# Patient Record
Sex: Male | Born: 1935 | Race: Black or African American | Hispanic: No | Smoking: Former smoker
Health system: Southern US, Community
[De-identification: ages and names within clinical notes are randomized; demographics above are authoritative.]

## PROBLEM LIST (undated history)

## (undated) DIAGNOSIS — I509 Heart failure, unspecified: Secondary | ICD-10-CM

## (undated) DIAGNOSIS — K219 Gastro-esophageal reflux disease without esophagitis: Secondary | ICD-10-CM

## (undated) DIAGNOSIS — I10 Essential (primary) hypertension: Secondary | ICD-10-CM

## (undated) DIAGNOSIS — M1990 Unspecified osteoarthritis, unspecified site: Secondary | ICD-10-CM

## (undated) DIAGNOSIS — G629 Polyneuropathy, unspecified: Secondary | ICD-10-CM

## (undated) DIAGNOSIS — R0600 Dyspnea, unspecified: Secondary | ICD-10-CM

## (undated) DIAGNOSIS — E669 Obesity, unspecified: Secondary | ICD-10-CM

## (undated) DIAGNOSIS — R079 Chest pain, unspecified: Secondary | ICD-10-CM

## (undated) DIAGNOSIS — J449 Chronic obstructive pulmonary disease, unspecified: Secondary | ICD-10-CM

## (undated) DIAGNOSIS — N471 Phimosis: Secondary | ICD-10-CM

## (undated) DIAGNOSIS — E785 Hyperlipidemia, unspecified: Secondary | ICD-10-CM

## (undated) HISTORY — PX: CHOLECYSTECTOMY: SHX55

## (undated) HISTORY — DX: Hyperlipidemia, unspecified: E78.5

## (undated) HISTORY — PX: ABDOMINAL SURGERY: SHX537

## (undated) HISTORY — PX: BACK SURGERY: SHX140

---

## 1998-08-04 ENCOUNTER — Encounter: Payer: Self-pay | Admitting: Emergency Medicine

## 1998-08-04 ENCOUNTER — Emergency Department (HOSPITAL_COMMUNITY): Admission: EM | Admit: 1998-08-04 | Discharge: 1998-08-04 | Payer: Self-pay | Admitting: Emergency Medicine

## 1999-12-22 ENCOUNTER — Emergency Department (HOSPITAL_COMMUNITY): Admission: EM | Admit: 1999-12-22 | Discharge: 1999-12-22 | Payer: Self-pay | Admitting: Emergency Medicine

## 1999-12-22 ENCOUNTER — Encounter: Payer: Self-pay | Admitting: Emergency Medicine

## 2000-01-07 ENCOUNTER — Emergency Department (HOSPITAL_COMMUNITY): Admission: EM | Admit: 2000-01-07 | Discharge: 2000-01-07 | Payer: Self-pay | Admitting: Emergency Medicine

## 2000-07-19 ENCOUNTER — Emergency Department (HOSPITAL_COMMUNITY): Admission: EM | Admit: 2000-07-19 | Discharge: 2000-07-19 | Payer: Self-pay | Admitting: Emergency Medicine

## 2000-07-19 ENCOUNTER — Encounter: Payer: Self-pay | Admitting: Emergency Medicine

## 2000-07-30 ENCOUNTER — Encounter: Admission: RE | Admit: 2000-07-30 | Discharge: 2000-07-30 | Payer: Self-pay | Admitting: Internal Medicine

## 2000-08-17 ENCOUNTER — Ambulatory Visit (HOSPITAL_COMMUNITY): Admission: RE | Admit: 2000-08-17 | Discharge: 2000-08-17 | Payer: Self-pay

## 2001-05-31 ENCOUNTER — Emergency Department (HOSPITAL_COMMUNITY): Admission: EM | Admit: 2001-05-31 | Discharge: 2001-05-31 | Payer: Self-pay

## 2003-02-10 ENCOUNTER — Encounter: Payer: Self-pay | Admitting: Nephrology

## 2003-02-10 ENCOUNTER — Encounter: Admission: RE | Admit: 2003-02-10 | Discharge: 2003-02-10 | Payer: Self-pay | Admitting: Nephrology

## 2003-05-18 ENCOUNTER — Encounter: Admission: RE | Admit: 2003-05-18 | Discharge: 2003-05-18 | Payer: Self-pay | Admitting: Nephrology

## 2004-08-29 ENCOUNTER — Encounter: Admission: RE | Admit: 2004-08-29 | Discharge: 2004-08-29 | Payer: Self-pay | Admitting: Nephrology

## 2005-03-21 ENCOUNTER — Encounter: Admission: RE | Admit: 2005-03-21 | Discharge: 2005-03-21 | Payer: Self-pay | Admitting: Nephrology

## 2005-11-23 ENCOUNTER — Ambulatory Visit (HOSPITAL_COMMUNITY): Admission: RE | Admit: 2005-11-23 | Discharge: 2005-11-23 | Payer: Self-pay | Admitting: Nephrology

## 2006-04-14 ENCOUNTER — Emergency Department (HOSPITAL_COMMUNITY): Admission: EM | Admit: 2006-04-14 | Discharge: 2006-04-14 | Payer: Self-pay | Admitting: Emergency Medicine

## 2006-06-30 ENCOUNTER — Emergency Department (HOSPITAL_COMMUNITY): Admission: EM | Admit: 2006-06-30 | Discharge: 2006-06-30 | Payer: Self-pay | Admitting: Emergency Medicine

## 2006-09-20 ENCOUNTER — Encounter: Admission: RE | Admit: 2006-09-20 | Discharge: 2006-09-20 | Payer: Self-pay | Admitting: Nephrology

## 2006-10-09 ENCOUNTER — Inpatient Hospital Stay (HOSPITAL_BASED_OUTPATIENT_CLINIC_OR_DEPARTMENT_OTHER): Admission: RE | Admit: 2006-10-09 | Discharge: 2006-10-09 | Payer: Self-pay | Admitting: Cardiology

## 2006-11-28 ENCOUNTER — Encounter: Admission: RE | Admit: 2006-11-28 | Discharge: 2006-11-28 | Payer: Self-pay | Admitting: Otolaryngology

## 2006-11-29 ENCOUNTER — Emergency Department (HOSPITAL_COMMUNITY): Admission: EM | Admit: 2006-11-29 | Discharge: 2006-11-29 | Payer: Self-pay | Admitting: Emergency Medicine

## 2007-08-21 ENCOUNTER — Encounter: Admission: RE | Admit: 2007-08-21 | Discharge: 2007-08-21 | Payer: Self-pay | Admitting: Nephrology

## 2007-08-28 ENCOUNTER — Ambulatory Visit (HOSPITAL_COMMUNITY): Admission: RE | Admit: 2007-08-28 | Discharge: 2007-08-28 | Payer: Self-pay | Admitting: Nephrology

## 2007-08-28 ENCOUNTER — Encounter (INDEPENDENT_AMBULATORY_CARE_PROVIDER_SITE_OTHER): Payer: Self-pay | Admitting: Nephrology

## 2007-08-28 ENCOUNTER — Ambulatory Visit: Payer: Self-pay | Admitting: Vascular Surgery

## 2008-04-29 ENCOUNTER — Emergency Department (HOSPITAL_COMMUNITY): Admission: EM | Admit: 2008-04-29 | Discharge: 2008-04-29 | Payer: Self-pay | Admitting: Emergency Medicine

## 2008-04-30 ENCOUNTER — Inpatient Hospital Stay (HOSPITAL_COMMUNITY): Admission: EM | Admit: 2008-04-30 | Discharge: 2008-05-05 | Payer: Self-pay | Admitting: Emergency Medicine

## 2008-04-30 ENCOUNTER — Encounter (INDEPENDENT_AMBULATORY_CARE_PROVIDER_SITE_OTHER): Payer: Self-pay | Admitting: General Surgery

## 2009-11-07 ENCOUNTER — Emergency Department (HOSPITAL_COMMUNITY): Admission: EM | Admit: 2009-11-07 | Discharge: 2009-11-07 | Payer: Self-pay | Admitting: Emergency Medicine

## 2009-11-13 ENCOUNTER — Emergency Department (HOSPITAL_COMMUNITY): Admission: EM | Admit: 2009-11-13 | Discharge: 2009-11-13 | Payer: Self-pay | Admitting: Emergency Medicine

## 2010-01-06 ENCOUNTER — Emergency Department (HOSPITAL_COMMUNITY): Admission: EM | Admit: 2010-01-06 | Discharge: 2010-01-06 | Payer: Self-pay | Admitting: Emergency Medicine

## 2010-01-20 ENCOUNTER — Emergency Department (HOSPITAL_COMMUNITY): Admission: EM | Admit: 2010-01-20 | Discharge: 2010-01-21 | Payer: Self-pay | Admitting: Emergency Medicine

## 2010-01-30 ENCOUNTER — Inpatient Hospital Stay (HOSPITAL_COMMUNITY): Admission: EM | Admit: 2010-01-30 | Discharge: 2010-02-04 | Payer: Self-pay | Admitting: Emergency Medicine

## 2010-02-01 ENCOUNTER — Encounter: Payer: Self-pay | Admitting: Cardiology

## 2010-02-02 ENCOUNTER — Other Ambulatory Visit: Payer: Self-pay | Admitting: Nephrology

## 2010-02-03 ENCOUNTER — Other Ambulatory Visit: Payer: Self-pay | Admitting: Nephrology

## 2010-02-04 ENCOUNTER — Other Ambulatory Visit: Payer: Self-pay | Admitting: Nephrology

## 2010-09-16 LAB — DIFFERENTIAL
Basophils Absolute: 0 10*3/uL (ref 0.0–0.1)
Basophils Relative: 0 % (ref 0–1)
Eosinophils Absolute: 0.4 10*3/uL (ref 0.0–0.7)
Eosinophils Relative: 4 % (ref 0–5)
Lymphocytes Relative: 36 % (ref 12–46)
Lymphs Abs: 2.2 10*3/uL (ref 0.7–4.0)
Monocytes Absolute: 0.6 10*3/uL (ref 0.1–1.0)
Monocytes Absolute: 0.6 10*3/uL (ref 0.1–1.0)
Monocytes Relative: 8 % (ref 3–12)
Monocytes Relative: 8 % (ref 3–12)
Neutro Abs: 3.9 10*3/uL (ref 1.7–7.7)

## 2010-09-16 LAB — BASIC METABOLIC PANEL
Chloride: 104 mEq/L (ref 96–112)
Creatinine, Ser: 1.24 mg/dL (ref 0.4–1.5)
GFR calc Af Amer: >60 mL/min (ref >60)
Potassium: 4.1 mEq/L (ref 3.5–5.1)

## 2010-09-16 LAB — GLUCOSE, CAPILLARY
Glucose-Capillary: 162 mg/dL — ABNORMAL HIGH (ref 70–99)
Glucose-Capillary: 167 mg/dL — ABNORMAL HIGH (ref 70–99)
Glucose-Capillary: 146 mg/dL — ABNORMAL HIGH (ref 70–99)
Glucose-Capillary: 168 mg/dL — ABNORMAL HIGH (ref 70–99)
Glucose-Capillary: 170 mg/dL — ABNORMAL HIGH (ref 70–99)
Glucose-Capillary: 177 mg/dL — ABNORMAL HIGH (ref 70–99)
Glucose-Capillary: 189 mg/dL — ABNORMAL HIGH (ref 70–99)

## 2010-09-16 LAB — RENAL FUNCTION PANEL
CO2: 24 mEq/L (ref 19–32)
Calcium: 8.5 mg/dL (ref 8.4–10.5)
Chloride: 103 mEq/L (ref 96–112)
GFR calc non Af Amer: 58 mL/min — ABNORMAL LOW (ref >60)
Glucose, Bld: 153 mg/dL — ABNORMAL HIGH (ref 70–99)
Sodium: 135 mEq/L (ref 135–145)

## 2010-09-16 LAB — LIPID PANEL
Cholesterol: 178 mg/dL (ref 0–200)
Total CHOL/HDL Ratio: 5.2 RATIO
VLDL: 39 mg/dL (ref 0–40)

## 2010-09-16 LAB — COMPREHENSIVE METABOLIC PANEL
AST: 17 U/L (ref 0–37)
Albumin: 2.6 g/dL — ABNORMAL LOW (ref 3.5–5.2)
Alkaline Phosphatase: 70 U/L (ref 39–117)
Chloride: 108 mEq/L (ref 96–112)
GFR calc Af Amer: >60 mL/min (ref >60)
Potassium: 3.8 mEq/L (ref 3.5–5.1)
Total Bilirubin: 0.4 mg/dL (ref 0.3–1.2)
Total Protein: 6.2 g/dL (ref 6.0–8.3)

## 2010-09-16 LAB — CBC
HCT: 42.6 % (ref 39.0–52.0)
HCT: 42.2 % (ref 39.0–52.0)
Hemoglobin: 14.6 g/dL (ref 13.0–17.0)
Hemoglobin: 14.2 g/dL (ref 13.0–17.0)
MCH: 31.5 pg (ref 26.0–34.0)
MCH: 31.8 pg (ref 26.0–34.0)
MCHC: 34.3 g/dL (ref 30.0–36.0)
MCV: 93.8 fL (ref 78.0–100.0)
MCV: 94.2 fL (ref 78.0–100.0)
Platelets: 136 10*3/uL — ABNORMAL LOW (ref 150–400)
Platelets: 138 10*3/uL — ABNORMAL LOW (ref 150–400)
RBC: 4.64 MIL/uL (ref 4.22–5.81)
RBC: 4.35 MIL/uL (ref 4.22–5.81)
RBC: 4.48 MIL/uL (ref 4.22–5.81)
RDW: 15.2 % (ref 11.5–15.5)
RDW: 15.5 % (ref 11.5–15.5)
WBC: 7.3 10*3/uL (ref 4.0–10.5)
WBC: 7.4 10*3/uL (ref 4.0–10.5)

## 2010-09-16 LAB — HEPARIN LEVEL (UNFRACTIONATED)
Heparin Unfractionated: 0.42 IU/mL (ref 0.30–0.70)
Heparin Unfractionated: 0.65 IU/mL (ref 0.30–0.70)

## 2010-09-16 LAB — PROTIME-INR: INR: 1.12 (ref 0.00–1.49)

## 2010-09-16 LAB — CARDIAC PANEL(CRET KIN+CKTOT+MB+TROPI)
Total CK: 103 U/L (ref 7–232)
Troponin I: <0.01 ng/mL (ref 0.00–0.06)

## 2010-09-16 LAB — MRSA PCR SCREENING: MRSA by PCR: NEGATIVE

## 2010-09-17 LAB — POCT CARDIAC MARKERS
CKMB, poc: 1.3 ng/mL (ref 1.0–8.0)
Myoglobin, poc: 135 ng/mL (ref 12–200)
Troponin i, poc: <0.05 ng/mL (ref 0.00–0.09)

## 2010-09-17 LAB — CARDIAC PANEL(CRET KIN+CKTOT+MB+TROPI)
CK, MB: 1 ng/mL (ref 0.3–4.0)
Troponin I: 0.02 ng/mL (ref 0.00–0.06)

## 2010-09-17 LAB — DIFFERENTIAL
Basophils Absolute: 0.1 10*3/uL (ref 0.0–0.1)
Lymphocytes Relative: 37 % (ref 12–46)
Lymphocytes Relative: 39 % (ref 12–46)
Lymphs Abs: 2.4 10*3/uL (ref 0.7–4.0)
Lymphs Abs: 2.6 10*3/uL (ref 0.7–4.0)
Monocytes Absolute: 0.5 10*3/uL (ref 0.1–1.0)
Monocytes Relative: 5 % (ref 3–12)
Neutro Abs: 3.1 10*3/uL (ref 1.7–7.7)
Neutro Abs: 3.5 10*3/uL (ref 1.7–7.7)
Neutrophils Relative %: 54 % (ref 43–77)

## 2010-09-17 LAB — URINALYSIS, ROUTINE W REFLEX MICROSCOPIC
Leukocytes, UA: NEGATIVE
Nitrite: NEGATIVE
Protein, ur: NEGATIVE mg/dL
Urobilinogen, UA: 0.2 mg/dL (ref 0.0–1.0)

## 2010-09-17 LAB — POCT I-STAT, CHEM 8
BUN: 11 mg/dL (ref 6–23)
Calcium, Ion: 1.11 mmol/L — ABNORMAL LOW (ref 1.12–1.32)
Chloride: 106 mEq/L (ref 96–112)
Creatinine, Ser: 1.2 mg/dL (ref 0.4–1.5)
Sodium: 140 mEq/L (ref 135–145)

## 2010-09-17 LAB — CK TOTAL AND CKMB (NOT AT ARMC): CK, MB: 1.1 ng/mL (ref 0.3–4.0)

## 2010-09-17 LAB — BASIC METABOLIC PANEL
Calcium: 9.1 mg/dL (ref 8.4–10.5)
GFR calc Af Amer: >60 mL/min (ref >60)
GFR calc non Af Amer: 56 mL/min — ABNORMAL LOW (ref >60)
Potassium: 3.6 mEq/L (ref 3.5–5.1)
Sodium: 138 mEq/L (ref 135–145)

## 2010-09-17 LAB — GLUCOSE, CAPILLARY
Glucose-Capillary: 163 mg/dL — ABNORMAL HIGH (ref 70–99)
Glucose-Capillary: 238 mg/dL — ABNORMAL HIGH (ref 70–99)

## 2010-09-17 LAB — CBC
HCT: 43.5 % (ref 39.0–52.0)
Hemoglobin: 14.8 g/dL (ref 13.0–17.0)
Hemoglobin: 15.6 g/dL (ref 13.0–17.0)
MCV: 93.8 fL (ref 78.0–100.0)
RBC: 4.64 MIL/uL (ref 4.22–5.81)
RBC: 4.86 MIL/uL (ref 4.22–5.81)
RDW: 15.5 % (ref 11.5–15.5)
WBC: 6.5 10*3/uL (ref 4.0–10.5)

## 2010-09-17 LAB — URINE CULTURE

## 2010-09-17 LAB — MRSA PCR SCREENING: MRSA by PCR: POSITIVE — AB

## 2010-09-17 LAB — URINE MICROSCOPIC-ADD ON

## 2010-09-18 LAB — POCT I-STAT, CHEM 8
BUN: 9 mg/dL (ref 6–23)
Calcium, Ion: 1.13 mmol/L (ref 1.12–1.32)
Chloride: 107 mEq/L (ref 96–112)
Glucose, Bld: 199 mg/dL — ABNORMAL HIGH (ref 70–99)

## 2010-09-20 LAB — URINALYSIS, ROUTINE W REFLEX MICROSCOPIC
Bilirubin Urine: NEGATIVE
Ketones, ur: NEGATIVE mg/dL
Nitrite: NEGATIVE
Protein, ur: NEGATIVE mg/dL
Urobilinogen, UA: 1 mg/dL (ref 0.0–1.0)

## 2010-09-20 LAB — GLUCOSE, CAPILLARY: Glucose-Capillary: 245 mg/dL — ABNORMAL HIGH (ref 70–99)

## 2010-09-20 LAB — URINE CULTURE: Colony Count: 4000

## 2010-09-20 LAB — CBC
HCT: 48.2 % (ref 39.0–52.0)
Platelets: 138 10*3/uL — ABNORMAL LOW (ref 150–400)
RBC: 5.15 MIL/uL (ref 4.22–5.81)
WBC: 7.5 10*3/uL (ref 4.0–10.5)

## 2010-09-20 LAB — POCT I-STAT, CHEM 8
BUN: 14 mg/dL (ref 6–23)
Chloride: 105 mEq/L (ref 96–112)
Creatinine, Ser: 1.1 mg/dL (ref 0.4–1.5)
Potassium: 4.4 mEq/L (ref 3.5–5.1)
Sodium: 139 mEq/L (ref 135–145)

## 2010-09-20 LAB — DIFFERENTIAL
Lymphocytes Relative: 34 % (ref 12–46)
Lymphs Abs: 2.6 10*3/uL (ref 0.7–4.0)
Neutrophils Relative %: 54 % (ref 43–77)

## 2010-09-20 LAB — CULTURE, ROUTINE-ABSCESS

## 2010-09-20 LAB — URINE MICROSCOPIC-ADD ON

## 2010-10-21 ENCOUNTER — Emergency Department (HOSPITAL_COMMUNITY)
Admission: EM | Admit: 2010-10-21 | Discharge: 2010-10-21 | Disposition: A | Discharge status: Home / Self Care | Payer: Medicare Other | Attending: Emergency Medicine | Admitting: Emergency Medicine

## 2010-10-21 ENCOUNTER — Emergency Department (HOSPITAL_COMMUNITY): Payer: Medicare Other

## 2010-10-21 DIAGNOSIS — J449 Chronic obstructive pulmonary disease, unspecified: Secondary | ICD-10-CM | POA: Insufficient documentation

## 2010-10-21 DIAGNOSIS — E785 Hyperlipidemia, unspecified: Secondary | ICD-10-CM | POA: Insufficient documentation

## 2010-10-21 DIAGNOSIS — X58XXXA Exposure to other specified factors, initial encounter: Secondary | ICD-10-CM | POA: Insufficient documentation

## 2010-10-21 DIAGNOSIS — R071 Chest pain on breathing: Secondary | ICD-10-CM | POA: Insufficient documentation

## 2010-10-21 DIAGNOSIS — I10 Essential (primary) hypertension: Secondary | ICD-10-CM | POA: Insufficient documentation

## 2010-10-21 DIAGNOSIS — Z79899 Other long term (current) drug therapy: Secondary | ICD-10-CM | POA: Insufficient documentation

## 2010-10-21 DIAGNOSIS — J4489 Other specified chronic obstructive pulmonary disease: Secondary | ICD-10-CM | POA: Insufficient documentation

## 2010-10-21 DIAGNOSIS — E119 Type 2 diabetes mellitus without complications: Secondary | ICD-10-CM | POA: Insufficient documentation

## 2010-10-21 DIAGNOSIS — T148XXA Other injury of unspecified body region, initial encounter: Secondary | ICD-10-CM | POA: Insufficient documentation

## 2010-10-23 ENCOUNTER — Emergency Department (HOSPITAL_COMMUNITY): Payer: Medicare Other

## 2010-10-23 ENCOUNTER — Emergency Department (HOSPITAL_COMMUNITY)
Admission: EM | Admit: 2010-10-23 | Discharge: 2010-10-23 | Disposition: A | Discharge status: Home / Self Care | Payer: Medicare Other | Attending: Emergency Medicine | Admitting: Emergency Medicine

## 2010-10-23 DIAGNOSIS — J449 Chronic obstructive pulmonary disease, unspecified: Secondary | ICD-10-CM | POA: Insufficient documentation

## 2010-10-23 DIAGNOSIS — Z79899 Other long term (current) drug therapy: Secondary | ICD-10-CM | POA: Insufficient documentation

## 2010-10-23 DIAGNOSIS — R071 Chest pain on breathing: Secondary | ICD-10-CM | POA: Insufficient documentation

## 2010-10-23 DIAGNOSIS — E119 Type 2 diabetes mellitus without complications: Secondary | ICD-10-CM | POA: Insufficient documentation

## 2010-10-23 DIAGNOSIS — E785 Hyperlipidemia, unspecified: Secondary | ICD-10-CM | POA: Insufficient documentation

## 2010-10-23 DIAGNOSIS — J4489 Other specified chronic obstructive pulmonary disease: Secondary | ICD-10-CM | POA: Insufficient documentation

## 2010-10-23 DIAGNOSIS — I10 Essential (primary) hypertension: Secondary | ICD-10-CM | POA: Insufficient documentation

## 2010-10-23 LAB — BASIC METABOLIC PANEL
BUN: 10 mg/dL (ref 6–23)
Chloride: 104 mEq/L (ref 96–112)
Creatinine, Ser: 1.14 mg/dL (ref 0.4–1.5)

## 2010-10-23 LAB — CBC
MCH: 31.3 pg (ref 26.0–34.0)
Platelets: 134 10*3/uL — ABNORMAL LOW (ref 150–400)
RBC: 5.43 MIL/uL (ref 4.22–5.81)
WBC: 6.6 10*3/uL (ref 4.0–10.5)

## 2010-10-23 LAB — POCT CARDIAC MARKERS: Troponin i, poc: <0.05 ng/mL (ref 0.00–0.09)

## 2010-10-23 LAB — DIFFERENTIAL
Basophils Relative: 1 % (ref 0–1)
Eosinophils Absolute: 0.4 10*3/uL (ref 0.0–0.7)
Monocytes Relative: 7 % (ref 3–12)
Neutrophils Relative %: 48 % (ref 43–77)

## 2010-10-25 ENCOUNTER — Other Ambulatory Visit (HOSPITAL_COMMUNITY): Payer: Self-pay | Admitting: Nephrology

## 2010-10-25 ENCOUNTER — Ambulatory Visit (HOSPITAL_COMMUNITY)
Admission: RE | Admit: 2010-10-25 | Discharge: 2010-10-25 | Disposition: A | Discharge status: Home / Self Care | Payer: Medicare Other | Source: Ambulatory Visit | Attending: Nephrology | Admitting: Nephrology

## 2010-10-25 DIAGNOSIS — M25519 Pain in unspecified shoulder: Secondary | ICD-10-CM | POA: Insufficient documentation

## 2010-10-25 DIAGNOSIS — M542 Cervicalgia: Secondary | ICD-10-CM | POA: Insufficient documentation

## 2010-10-25 DIAGNOSIS — R52 Pain, unspecified: Secondary | ICD-10-CM

## 2010-10-25 DIAGNOSIS — M539 Dorsopathy, unspecified: Secondary | ICD-10-CM | POA: Insufficient documentation

## 2010-10-25 DIAGNOSIS — R0789 Other chest pain: Secondary | ICD-10-CM | POA: Insufficient documentation

## 2010-10-29 ENCOUNTER — Emergency Department (HOSPITAL_COMMUNITY): Payer: Medicare Other

## 2010-10-29 ENCOUNTER — Emergency Department (HOSPITAL_COMMUNITY)
Admission: EM | Admit: 2010-10-29 | Discharge: 2010-10-29 | Disposition: A | Discharge status: Home / Self Care | Payer: Medicare Other | Attending: Emergency Medicine | Admitting: Emergency Medicine

## 2010-10-29 DIAGNOSIS — Z7982 Long term (current) use of aspirin: Secondary | ICD-10-CM | POA: Insufficient documentation

## 2010-10-29 DIAGNOSIS — M25519 Pain in unspecified shoulder: Secondary | ICD-10-CM | POA: Insufficient documentation

## 2010-10-29 DIAGNOSIS — J449 Chronic obstructive pulmonary disease, unspecified: Secondary | ICD-10-CM | POA: Insufficient documentation

## 2010-10-29 DIAGNOSIS — Z9889 Other specified postprocedural states: Secondary | ICD-10-CM | POA: Insufficient documentation

## 2010-10-29 DIAGNOSIS — E785 Hyperlipidemia, unspecified: Secondary | ICD-10-CM | POA: Insufficient documentation

## 2010-10-29 DIAGNOSIS — I10 Essential (primary) hypertension: Secondary | ICD-10-CM | POA: Insufficient documentation

## 2010-10-29 DIAGNOSIS — R079 Chest pain, unspecified: Secondary | ICD-10-CM | POA: Insufficient documentation

## 2010-10-29 DIAGNOSIS — J4489 Other specified chronic obstructive pulmonary disease: Secondary | ICD-10-CM | POA: Insufficient documentation

## 2010-10-29 DIAGNOSIS — M542 Cervicalgia: Secondary | ICD-10-CM | POA: Insufficient documentation

## 2010-10-29 DIAGNOSIS — E119 Type 2 diabetes mellitus without complications: Secondary | ICD-10-CM | POA: Insufficient documentation

## 2010-10-29 LAB — POCT CARDIAC MARKERS

## 2010-11-02 ENCOUNTER — Encounter (HOSPITAL_COMMUNITY): Payer: Self-pay | Admitting: Radiology

## 2010-11-02 ENCOUNTER — Emergency Department (HOSPITAL_COMMUNITY)
Admission: EM | Admit: 2010-11-02 | Discharge: 2010-11-02 | Disposition: A | Discharge status: Home / Self Care | Payer: Medicare Other | Attending: Emergency Medicine | Admitting: Emergency Medicine

## 2010-11-02 ENCOUNTER — Emergency Department (HOSPITAL_COMMUNITY): Payer: Medicare Other

## 2010-11-02 DIAGNOSIS — R911 Solitary pulmonary nodule: Secondary | ICD-10-CM | POA: Insufficient documentation

## 2010-11-02 DIAGNOSIS — J449 Chronic obstructive pulmonary disease, unspecified: Secondary | ICD-10-CM | POA: Insufficient documentation

## 2010-11-02 DIAGNOSIS — E785 Hyperlipidemia, unspecified: Secondary | ICD-10-CM | POA: Insufficient documentation

## 2010-11-02 DIAGNOSIS — Z9889 Other specified postprocedural states: Secondary | ICD-10-CM | POA: Insufficient documentation

## 2010-11-02 DIAGNOSIS — M542 Cervicalgia: Secondary | ICD-10-CM | POA: Insufficient documentation

## 2010-11-02 DIAGNOSIS — E119 Type 2 diabetes mellitus without complications: Secondary | ICD-10-CM | POA: Insufficient documentation

## 2010-11-02 DIAGNOSIS — J4489 Other specified chronic obstructive pulmonary disease: Secondary | ICD-10-CM | POA: Insufficient documentation

## 2010-11-02 DIAGNOSIS — R071 Chest pain on breathing: Secondary | ICD-10-CM | POA: Insufficient documentation

## 2010-11-02 DIAGNOSIS — M25519 Pain in unspecified shoulder: Secondary | ICD-10-CM | POA: Insufficient documentation

## 2010-11-02 DIAGNOSIS — I10 Essential (primary) hypertension: Secondary | ICD-10-CM | POA: Insufficient documentation

## 2010-11-02 HISTORY — DX: Essential (primary) hypertension: I10

## 2010-11-02 LAB — POCT CARDIAC MARKERS
CKMB, poc: <1 ng/mL — ABNORMAL LOW (ref 1.0–8.0)
Myoglobin, poc: 114 ng/mL (ref 12–200)
Troponin i, poc: <0.05 ng/mL (ref 0.00–0.09)

## 2010-11-02 LAB — DIFFERENTIAL
Basophils Absolute: 0 10*3/uL (ref 0.0–0.1)
Basophils Relative: 1 % (ref 0–1)
Eosinophils Absolute: 0.3 10*3/uL (ref 0.0–0.7)
Lymphs Abs: 2.7 10*3/uL (ref 0.7–4.0)
Neutrophils Relative %: 44 % (ref 43–77)

## 2010-11-02 LAB — CBC
Platelets: 135 10*3/uL — ABNORMAL LOW (ref 150–400)
RBC: 5.39 MIL/uL (ref 4.22–5.81)
WBC: 6.2 10*3/uL (ref 4.0–10.5)

## 2010-11-02 LAB — BASIC METABOLIC PANEL
Chloride: 98 mEq/L (ref 96–112)
GFR calc Af Amer: >60 mL/min (ref >60)
GFR calc non Af Amer: >60 mL/min (ref >60)
Potassium: 4.9 mEq/L (ref 3.5–5.1)
Sodium: 132 mEq/L — ABNORMAL LOW (ref 135–145)

## 2010-11-02 MED ORDER — IOHEXOL 300 MG/ML  SOLN: 74.0000 mL | Freq: Once | INTRAMUSCULAR | Status: DC | PRN

## 2010-11-15 NOTE — Consult Note (Signed)
NAMEDANFORD, TAT              ACCOUNT NO.:  [id number]   MEDICAL RECORD NO.:  [id number]          PATIENT TYPE:  EMS   LOCATION:  MAJO                         FACILITY:  [facility]   PHYSICIAN:  Clovis Pu. Cornett, M.D.DATE OF BIRTH:  1929-08-24   DATE OF CONSULTATION:  04/30/2008  DATE OF DISCHARGE:                                 CONSULTATION   PHYSICIAN REQUESTING CONSULTATION:  Hilario Quarry, MD.   PRIMARY CARE DOCTOR:  Dr. Jeri Cos is his primary care doctor he  states.   REASON FOR CONSULTATION:  Abdominal pain.   HISTORY OF PRESENT ILLNESS:  The patient is a 77 year old male with a  three-day history of right upper quadrant pain.  The pain started three  days ago.  Located in his right upper quadrant.  It is an 8-10/10 in  severity.  It is sharp in nature.  He was seen here yesterday where an  ultrasound was performed, which showed gallstones, but apparently was  feeling better and was sent home.  Overnight, the pain returned.  It is  severe in nature, and he returned to the emergency room.  No associated  nausea or vomiting.  Does have some mild shortness of breath.  Denies  any chest pain currently.  He has also had trouble with constipation.  I  was asked to see the patient at the request of Dr. Rosalia Hammers for this.   PAST MEDICAL HISTORY:  1. Hypercholesterolemia.  2. Diabetes.  3. Hypertension.   PAST SURGICAL HISTORY:  Abdominal wall hernia repair 25 years ago.   ALLERGIES:  None.   MEDICATIONS:  He is unable to tell us his medication list today.   SOCIAL HISTORY:  Denies tobacco or alcohol use.  He is Programmer, multimedia.   FAMILY HISTORY:  Noncontributory.   REVIEW OF SYSTEMS:  As stated above.  Fifteen point review of systems is  otherwise negative.   PHYSICAL EXAMINATION:  VITAL SIGNS:  Temperature is 98, pulse 100, and  blood pressure 138/91.  HEENT:  Extraocular movements are intact.  Oropharynx is clear.  No  scleral icterus.  NECK:  Supple.  Nontender.   Trachea midline.  PULMONARY:  Lungs are clear to auscultation bilaterally.  Chest wall  motion normal.  CARDIOVASCULAR:  Regular rate and rhythm without rub, murmur, or gallop.  EXTREMITIES:  Warm and well perfused.  No peripheral edema.  ABDOMEN:  Tender right upper quadrant.  Mild rebound.  No guarding.  Soft.  Midline incision of upper abdomen noted.  No hernia.  No mass.  EXTREMITIES:  Muscle tone normal.  Range of motion is normal.  NEUROLOGIC:  Glasgow Coma scale is 15.  Motor and sensory function are  grossly intact.   DIAGNOSTIC STUDIES:  I reviewed his ultrasound and CT scan of abdomen  and pelvis, which shows gallstones.  No obvious signs of acute  cholecystitis.  He has mostly liver cyst and kidney cyst.  He does have  some atherosclerotic changes to his major vessels.   LABORATORY DATA:  His white count is 9600, his hemoglobin is 16, and  platelet count is 186,000.  Sodium is 133, potassium 4.6, chloride 102,  CO2 24, BUN 7, and creatinine 1.2.  Liver functions are normal.  Lipase  24.   IMPRESSION:  1. Symptomatic cholelithiasis, probable early cholecystitis.  2. Hypercholesterolemia.  3. Type 2 diabetes mellitus.  4. Hypertension.   PLAN:  We will admit to the doc of the week service.  He may need  medical consultation, but I think we would benefit from cholecystectomy  given his symptoms and return to the emergency room.  I will discuss the  case with Dr. Johna Sheriff who is the doc of the week.      Thomas A. Cornett, M.D.  Electronically Signed     TAC/MEDQ  D:  04/30/2008  T:  04/30/2008  Job:  [id number]   cc:   Hilario Quarry, M.D.

## 2010-11-15 NOTE — Op Note (Signed)
[name], [name]              ACCOUNT NO.:  [id number]   MEDICAL RECORD NO.:  [id number]          PATIENT TYPE:  INP   LOCATION:  5153                         FACILITY:  [facility]   PHYSICIAN:  Sharlet Salina T. Hoxworth, M.D.DATE OF BIRTH:  05/27/1930   DATE OF PROCEDURE:  04/30/2008  DATE OF DISCHARGE:                               OPERATIVE REPORT   PREOPERATIVE DIAGNOSIS:  Cholelithiasis and cholecystitis.   POSTOPERATIVE DIAGNOSIS:  Cholelithiasis and cholecystitis.   SURGICAL PROCEDURE:  Laparoscopy and open cholecystectomy with  cholangiogram.   SURGEON:  Sharlet Salina T. Hoxworth, MD   ASSISTANT:  Kelle Darting. Rennis Harding, NP   BRIEF HISTORY:  [name] is a 77 year old male with hypertension and  morbid obesity who presents with several days of intermittent worsening  of severe epigastric and right upper quadrant abdominal pain.  He has  had workup in the emergency room including CT scan and ultrasound of the  gallbladder showing multiple gallstones, but no evidence of acute  cholecystitis.  LFTs are normal.  He has tenderness in the epigastrium  and right upper quadrant with ongoing pain.  I have recommended  proceeding with urgent laparoscopic and possible open cholecystectomy.  The patient has procedure of an upper midline incision a number of years  ago for hernia repair otherwise unknown details.  The nature  procedure, its indications, risks of bleeding, infection, bile leak,  bile duct injury, and possible need for open procedure were discussed  and understood.  He is now brought to the operating room for this  procedure.   DESCRIPTION OF OPERATION:  The patient was brought to the operating room  and placed in supine position on the operating table, and general  endotracheal anesthesia was induced.  He received preoperative IV  antibiotics.  PAS were placed. The abdomen was widely and sterilely  prepped and draped.  Correct patient and procedure were verified.  I  made a 1-cm  incision in the midline above the umbilicus, but below his  previous midline incision.  Dissection was carried down through the  subcutaneous tissue in midline fashion.  The peritoneum was entered  under direct vision.  Through a mattress suture of Vicryl, the Hasson  trocar was placed, and pneumoperitoneum established.  There were noted  to be unfortunately extensive intra-abdominal adhesions with the omentum  and colon adherent up to the anterior abdominal wall and the upper  abdomen.  I was, however, able to place two 5-mm trocars in the right  upper to mid abdomen in a free area and then, using these two ports, I  performed an extensive adhesiolysis, clearing the anterior abdominal  wall in the right upper quadrant.  These were mainly omental adhesions.  The transverse colon was nearby, but really did not have to manipulate  this.  These adhesions were cleared over to the midline and up to the  xiphoid.  There were number of omental adhesions up to the liver as well  and these were taken down from the anterior border of the liver and at  this point, the gallbladder was identified.  It was chronically  inflamed,  somewhat thickened and fairly tense.  The gallbladder was  aspirated to allow to be grasped and then, the fundus was able to be  grasped, and I put a 11-mm trocar in subxiphoid.  The gallbladder was  somewhat woody and chronically inflamed and had little mobility.  The  liver also had little mobility due to the multiple adhesions.  I did  lyse adhesions over the dome of the liver and out laterally, so that we  could move it somewhat and elevate the gallbladder.  Adhesions were then  carefully taken down off the fundus and infundibulum of the gallbladder,  and the infundibulum was able to be grasped and retracted laterally.  Using a 30-degree scope, I was able to see down distally along the  infundibulum of the gallbladder and I stayed on the gallbladder and  dissected down  toward the porta hepatis clearly staying on the  gallbladder wall.  As the dissection progressed toward the porta  hepatis, the exposure became more difficult as again, we really could  not move the gallbladder due to adhesions and the thickness and chronic  inflammation of the gallbladder.  At this point, I felt that we were  really unable to visualize critical structures due to the immobility of  the gallbladder, and I elected to convert this to an open procedure.  All instruments were counted and set up and then, a right subcostal  incision was used and dissection carried down through the subcu and  fascial muscle layers using the cautery.  The gallbladder was exposed  packing away viscera.  I was able to feel that the infundibulum extended  down more medially, then we were able to see and was packed with stones.  Again, the gallbladder was very immobile, and I elected to take it down  top to bottom.  Beginning at the fundus, gallbladder was dissected away  from the liver using cautery.  There was no really a good plane between  the gallbladder and the liver and as we progressed down toward the  infundibulum, I essentially carefully bluntly dissected the gallbladder  out of its bed and placed a pack in the gallbladder bed for hemostasis  and then was able to pull the gallbladder laterally with better exposure  and see the distal gallbladder and infundibulum and staying on the  gallbladder, dissected down and clearly identified the cystic artery  coursing up on the gallbladder wall, which was divided between clips.  Careful blunt dissection along the gallbladder was then isolated the  cystic duct and the cystic duct gallbladder junction clearly defined.  At this point, I obtained a cholangiogram through the cystic duct, which  showed good filling of a normal common bile duct and intrahepatic ducts  and free flow into the duodenum and no filling defects.  Following this,  the  cholangiocath was removed, and the cystic duct was doubly clipped  and divided.  The gallbladder was removed.  Attention was then turned to  obtain a hemostasis in the gallbladder bed.  Cautery was somewhat  effective, but then I used a FloSeal with a Surgicel pack and after  about 3 minutes of pressure, complete hemostasis was obtained.  The  right upper quadrant was irrigated.  A closed suction drain was left in  the subhepatic space and brought out through the lateral trocar sites.  The wound was then closed in layers with running 0 PDS and subcu  irrigated, and skin was closed with staples.  Sponge, needle, and  instrument counts were correct.  Dry sterile dressings were applied, and  the patient was taken to recovery in good condition.      Lorne Skeens. Hoxworth, M.D.  Electronically Signed     BTH/MEDQ  D:  04/30/2008  T:  05/01/2008  Job:  [id number]

## 2010-11-15 NOTE — Discharge Summary (Signed)
NAMESHERMAINE, BRIGHAM              ACCOUNT NO.:  [id number]   MEDICAL RECORD NO.:  [id number]          PATIENT TYPE:  INP   LOCATION:  5153                         FACILITY:  [facility]   PHYSICIAN:  Sharlet Salina T. Hoxworth, M.D.DATE OF BIRTH:  January 17, 1930   DATE OF ADMISSION:  04/30/2008  DATE OF DISCHARGE:  05/05/2008                               DISCHARGE SUMMARY   CONSULTING PHYSICIAN:  Maisie Fus A. Cornett, MD   REQUESTING PHYSICIAN:  Hilario Quarry, MD from the ER.   DISCHARGING PHYSICIAN:  Sharlet Salina T. Hoxworth, MD.   OPERATIVE PHYSICIAN:  Lorne Skeens. Hoxworth, MD   CHIEF COMPLAINT/REASON FOR ADMISSION:  Mr. Tatsch is a 77 year old male  patient presented with a 3-day history of right upper quadrant abdominal  pain 8/10 associated with nausea and vomiting.  Also has a history  diabetes, hypertension, and dyslipidemia.  In the ER, his abdomen was  tender in the right upper quadrant with mild rebounding.  He was  afebrile but was mildly tachycardic with heart rate of 100.  He had a  white count of 9600, hemoglobin 16.  LFTs were normal.  Lipase 24.  CT  of the abdomen and pelvis demonstrated gallstones without signs of acute  cholecystitis, liver cyst, and kidney cyst.  He was admitted by Dr.  Luisa Hart with a diagnosis of symptomatic cholelithiasis probable evolving  cholecystitis, dyslipidemia, diabetes, and hypertension.   HOSPITAL COURSE:  The patient was admitted to the general surgical floor  where he was placed on n.p.o. status and started empirically on  antibiotic therapy with Cipro IV.  He was subsequently taken to the OR  on day of admission April 30, 2008, by Dr. Johna Sheriff where he underwent  a laparoscopic converted to open cholecystectomy with a normal  intraoperative cholangiogram.  Please refer to Dr. Jamse Mead operative  note for details.  In short, the patient has had prior intraabdominal  surgery involving apparent upper GI ulcer as well as a hiatal hernia  repair  open.  Had significant amount of adhesions and we were unable to  proceed with laparoscopic approach to cholecystectomy.   The patient was sent back to the general floor to recover.  He had mild  leukocytosis in the postoperative period and mild transaminitis, which  eventually resolved.  Peak total bilirubin was 1.9, peak AST 65, peak  alkaline phosphatase 99, and peak ALT 37.  His diet was slowly advanced  and by postoperative day #3, he was tolerating a solid diet with mild  anorexia.  By postop day #4, his abdomen was benign.  Staple line was  intact.  He still had 70 mL of bloody JP drainage, so the JP was left in  place.  There was no bilious output and plans were to possibly discharge  the patient home on postoperative day #5 and discontinue JP prior to  discharge.   On postop day #5, the patient was afebrile, vital signs were stable.  He  was sating 100% on room air.  His white count was 8000, hemoglobin 13,  platelets 179,000.  Sodium 138, potassium 3.6, CO2 of 29, glucose 158,  BUN 7, creatinine 1.08.  JP had less than 5 mL out.  The JP was  discontinued by the nursing staff.  Staples remained in place and plans  are to discontinue these at followup visit on May 12, 2008, with  the _________[facility].   In addition, the patient has a history of diabetes and hypertension and  could not recall his medications, and family was unable to bring the  medications until the day before discharge.  These medications have been  found and will be listed on the discharge summary.  In short, hemoglobin  A1c was checked and was mildly elevated at 7.7 this admission.  Otherwise, the patient has been stable with his diabetes with sugars  generally in the 150s to 250 range decreasing after appropriate  treatment for cholecystitis.   FINAL DISCHARGE DIAGNOSES:  1. Symptomatic cholelithiasis with acute on chronic cholecystitis      confirmed by surgical pathology.  2. Status post  laparoscopic converted to open cholecystectomy with      normal intraoperative cholangiogram.  3. Diabetes mellitus, moderately controlled preoperatively.  4. Hypertension, controlled.   DISCHARGE MEDICATIONS:  The patient will resume the following home  medications.  1. Simvastatin 20 mg daily.  2. Glipizide ER 10 mg daily.  3. Sulindac 200 mg b.i.d.  4. While hospitalized, the patient has received the pneumococcal      vaccine on May 01, 2008.  The seasonal flu vaccine was not      available and the patient is encouraged to follow up with his      primary care Kalief Kattner to receive this vaccination.   New medications are as follows:  Percocet 5/325 one to two tablets every  4 hours as needed for pain.   DIET:  Low sodium heart-healthy with modified carbs for diabetic.   WOUND CARE:  Pat staple areas dry after showering.   ACTIVITY:  Increase activity slowly.  May walk up steps.  May shower.  No lifting greater than 10 pounds for 5 weeks.  No driving for 2 weeks.   FOLLOWUP:  You are to follow up with the __________ Clinic at [facility], telephone number [phone] on May 12, 2008, at  2:15 for staple removal.  Please arrive at 2:00 p.m.      Allison L. Rennis Harding, N.P.      Lorne Skeens. Hoxworth, M.D.     ALE/MEDQ  D:  05/05/2008  T:  05/05/2008  Job:  [id number]

## 2010-11-18 NOTE — Discharge Summary (Signed)
NAMEPAULANTHONY, GLEAVES              ACCOUNT NO.:  [id number]   MEDICAL RECORD NO.:  [id number]          PATIENT TYPE:  INP   LOCATION:  5153                         FACILITY:  [facility]   PHYSICIAN:  Sharlet Salina T. Hoxworth, M.D.DATE OF BIRTH:  1929/12/30   DATE OF ADMISSION:  04/30/2008  DATE OF DISCHARGE:  05/05/2008                               DISCHARGE SUMMARY   DISCHARGING PHYSICIAN:  Sharlet Salina T. Hoxworth, M.D.   OPERATING PHYSICIAN:  Sharlet Salina T. Hoxworth, M.D.   CHIEF COMPLAINT/REASON FOR ADMISSION:  Mr. Biehn is a 77 year old male  patient with 3-day history of right upper quadrant pain with nausea and  vomiting.  Other medical history includes diabetes, hypertension, and  dyslipidemia, presented to the ER for these complaints, was found to  have tenderness in the right upper quadrant with mild rebounding.  He  was afebrile, mildly tachycardic.  White count 9600.  LFTs normal.  Lipase 24.  CT of the abdomen and pelvis demonstrated gallstones without  evidence of acute cholecystitis.  He was also found to have a liver cyst  and a kidney cyst.  He was admitted by Dr. Luisa Hart with symptomatic  cholelithiasis, probably evolving cholecystitis.   ADMITTING DIAGNOSES:  1. Abdominal pain secondary to cholelithiasis and biliary colic,      evolving cholecystitis.  2. Diabetes.  3. Dyslipidemia.  4. Hypertension.   HOSPITAL COURSE:  The patient was admitted, started on empiric  antibiotic therapy with Cipro and IV fluids.  He was taken to the OR on  the day of admission by Dr. Johna Sheriff, underwent a laparoscopic converted  to open cholecystectomy.  The patient has significant amount of intra-  abdominal adhesions from prior surgery, and therefore this was why he  underwent the open cholecystectomy.  His intraoperative cholangiogram  was normal.  The patient tolerated the procedure well and was sent back  to the general floor to recover.   For the remainder of the hospitalization, the  patient did well.  His  transaminitis slowly resolved.  By postoperative day 3, the patient was  tolerating a solid diet.  JP drain still had moderate amounts of bloody  serous drainage, so the JP drain was left in place until postoperative  day 5.  There was no bilious output on day of discontinuation of JP  drain.   On postoperative day 5, the patient was deemed appropriate for discharge  home.  He was afebrile.  Vital signs were stable.  He was sating 100% on  room air.  White count 8000, hemoglobin 13, platelets 179,000.  Sodium  138, potassium 3.6, glucose 158, BUN 7, creatinine 1.08.  His wound was  clean, dry, and intact.  Staples remained in place and the patient will  follow up at the Surgical [facility] on May 12, 2008, to see the  physician extender for followup.   In addition, a hemoglobin A1c was checked on the patient this admission  because of his history diabetes and this was 7.7.   FINAL DISCHARGE DIAGNOSES:  1. Symptomatic cholelithiasis with acute on chronic cholecystitis      confirmed by surgical pathology.  2. Status post laparoscopic converted to open cholecystectomy with      normal intraoperative cholangiogram.  3. Diabetes mellitus.  4. Hypertension.   DISCHARGE MEDICATIONS:  1. Simvastatin 20 mg daily.  2. Glipizide ER 10 mg daily.  3. Sulindac 200 mg b.i.d.  4. The patient received pneumococcal vaccine on May 01, 2008.      While hospitalized, seasonal flu vaccine was not available.  The      patient encouraged to follow up with primary care to receive      vaccination.  5. Percocet 5/325 one to two tablets every 4 hours as needed for pain.   DISCHARGE DIET:  Low-sodium heart-healthy with modified carb diabetic.   WOUND CARE:  Pat staple line dry after shower and follow up at clinic  visit for removal.   ACTIVITY:  Increase activity slowly.  May walk up steps.  May shower.  No lifting greater than 10 pounds for 5 weeks.  No driving for 2  weeks.   FOLLOWUP:  He will follow up at the [facility] on  May 12, 2008, and 2:15 for staple removal.       Revonda Standard L. Rennis Harding, N.P.      Lorne Skeens. Hoxworth, M.D.  Electronically Signed    ALE/MEDQ  D:  06/17/2008  T:  06/18/2008  Job:  [id number]

## 2010-11-18 NOTE — Cardiovascular Report (Signed)
NAMEJOVI, ZAVADIL              ACCOUNT NO.:  [id number]   MEDICAL RECORD NO.:  [id number]          PATIENT TYPE:  OIB   LOCATION:  1962                         FACILITY:  [facility]   PHYSICIAN:  Mohan N. Sharyn Lull, M.D. DATE OF BIRTH:  1929-10-19   DATE OF PROCEDURE:  10/09/2006  DATE OF DISCHARGE:                            CARDIAC CATHETERIZATION   PROCEDURE:  Left cardiac catheterization with selective left and right  coronary angiography, left ventriculography via the right groin, using  Judkins technique   INDICATIONS FOR PROCEDURE:  Mr. [name] is a 77 year old black male with  past medical history significant for diabetes mellitus,  hypercholesteremia, morbid obesity, who complains of retrosternal chest  pain associated with exertion, relieved with rest and sublingual  nitroglycerin.  Chest pain in grade 6/10, associated with occasional  numbness in the left arm.  He also complains of exertional dyspnea.  Denies any nausea, vomiting, diaphoresis.  Denies palpitation,  lightheadedness or syncope.  Denies PND, orthopnea, or leg swelling.  Denies relation of chest pain to food, breathing or movement.   PAST MEDICAL HISTORY:  As above.   PAST SURGICAL HISTORY:  He had Aback surgery x4 in the past, had ventral  hernia repair in the past.   ALLERGIES:  No known drug allergies.   MEDICATIONS:  At home he is on enteric-coated aspirin 81 mg p.o. daily,  glyburide 5 mg p.o. b.i.d., Nitrostat sublingual p.r.n.   SOCIAL HISTORY:  He is divorced, has 10 children.  Smoked one pack per  week for 20+ years, quit 5 years ago.  Used to drink heavily for 25+  years, quit 25 years ago.  He worked for the [employer] for  the [employer] in the past.   FAMILY HISTORY:  Father died of gunshot wound.  Mother died of cancer.  One sister died of cancer.   PHYSICAL EXAMINATION:  GENERAL:  On examination he was alert, awake,  oriented x3, in no acute distress.  VITAL SIGNS:   Blood pressure was 120/76, pulse was 74 and regular.  HEENT:  Conjunctivae were pink.  NECK:  Supple.  No JVD, no bruit.  LUNGS:  Clear to auscultation without rhonchi or rales.  CARDIOVASCULAR:  S1 and S2 was normal.  There was a soft systolic  murmur.  There was no S3 or S4 gallop.  ABDOMEN:  Soft.  Bowel sounds were present, obese, nontender.  EXTREMITIES:  There was no clubbing, cyanosis or edema.   IMPRESSION:  1. New-onset angina.  2. Non-insulin-dependent diabetes mellitus.  3. History of tobacco abuse.  4. Chronic obstructive pulmonary disease.  5. Morbid obesity.   Discussed with the patient regarding noninvasive stress testing versus  left catheterization, its risks and benefits., i.e. death, MI, stroke,  need for emergency CABG, risk of restenosis, local vascular  complications, accepted and consented for left catheterization.   PROCEDURE:  After obtaining the informed consent, the patient was  brought to the catheterization lab and was placed on fluoroscope table.  The right groin was prepped and draped in the usual fashion.  Xylocaine  2% was used for local  anesthesia in the right groin.  With the help of  thin-wall needle, a 4-French arterial sheath was placed.  The sheath was  aspirated and flushed.  Next, 4-French left Judkins catheter was  advanced over the wire under fluoroscopic guidance up to the ascending  aorta.  Wire was pulled out.  The catheter was aspirated and connected  to the manifold.  The catheter was further advanced and engaged into  left coronary ostium.  Multiple views of the left system were taken.  Next the catheter was disengaged and was pulled out over the wire and  was replaced with 4-French right 3-D diagnostic catheter, which was  advanced over the wire under fluoroscopic guidance up to the ascending  aorta.  Wire was pulled out.  The catheter was aspirated and connected  to the manifold.  The catheter was further advanced and engaged into   right coronary ostium.  Multiple views of the right system were taken.  Next the catheter was disengaged and was pulled out over the wire and  was replaced with a 4-French pigtail catheter, which was advanced over  the wire under fluoroscopic guidance to the ascending aorta.  Wire was  pulled out.  The catheter was aspirated and connected to the manifold.  Catheter was further advanced across the aortic valve into the LV.  LV  pressures were recorded.  Next left ventriculography was done in 30-  degree RAO position.  Post angiographic pressures were recorded from LV  and then pullback pressures were recorded from the aorta.  There was no  gradient across the aortic valve.  Next the pigtail catheter was pulled  out over the wire and sheaths were aspirated and flushed.   FINDINGS:  LV showed good LV systolic function, EF of 55-60%.  Left main  was long, which was patent.  LAD was small, which has 10-15% proximal  stenosis and was diffusely diseased distally.  Diagonal #1 to diagonal  #3 were small, which were patent.  Ramus was small, which was patent.  Left circumflex has 15-20% proximal stenosis.  OM-1 and OM-2 were very  small, which were patent.  OM-3 was small, which was patent.  RCA has 10-  15% mid stenosis.  PDA was small, which was patent.  PLV branches were  small, which were patent.  The patient tolerated procedure well.  There  were no complications.  The patient was transferred to the recovery room  in stable condition.   PLAN:  Continue with present management.  Check his lipid panel and  start him on statins if cholesterol is elevated.           ______________________________  Eduardo Osier. Sharyn Lull, M.D.     M[facility]/MEDQ  D:  10/09/2006  T:  10/09/2006  Job:  [id number]   cc:   Jarome Matin, M.D.  Cardiac Catheterization Lab

## 2011-04-03 LAB — CBC
HCT: 39.7
HCT: 40.3
HCT: 50.2
Hemoglobin: 13.1
Hemoglobin: 16.8
MCHC: 33.2
MCHC: 33.8
MCV: 93.7
MCV: 94.3
Platelets: 117 — ABNORMAL LOW
Platelets: 139 — ABNORMAL LOW
Platelets: 150
RBC: 5.36
RBC: 5.41
RDW: 14.5
WBC: 7.6
WBC: 9.6
WBC: 9.9

## 2011-04-03 LAB — COMPREHENSIVE METABOLIC PANEL
ALT: 20
ALT: 37
AST: 34
AST: 41 — ABNORMAL HIGH
Albumin: 3.2 — ABNORMAL LOW
Alkaline Phosphatase: 93
Alkaline Phosphatase: 99
BUN: 10
BUN: 15
CO2: 24
CO2: 25
Calcium: 8 — ABNORMAL LOW
Calcium: 8.9
Chloride: 102
Chloride: 106
Creatinine, Ser: 1.27
GFR calc Af Amer: >60
GFR calc Af Amer: >60
GFR calc non Af Amer: 55 — ABNORMAL LOW
GFR calc non Af Amer: 59 — ABNORMAL LOW
Glucose, Bld: 200 — ABNORMAL HIGH
Potassium: 4.6
Potassium: 5.4 — ABNORMAL HIGH
Sodium: 133 — ABNORMAL LOW
Sodium: 133 — ABNORMAL LOW
Total Bilirubin: 1.9 — ABNORMAL HIGH
Total Protein: 5.9 — ABNORMAL LOW

## 2011-04-03 LAB — URINE MICROSCOPIC-ADD ON

## 2011-04-03 LAB — DIFFERENTIAL
Basophils Absolute: 0
Basophils Relative: 0
Basophils Relative: 0
Eosinophils Absolute: 0
Eosinophils Relative: 1
Eosinophils Relative: 1
Eosinophils Relative: 3
Lymphocytes Relative: 19
Lymphocytes Relative: 22
Lymphs Abs: 1.7
Lymphs Abs: 1.9
Monocytes Absolute: 0.2
Monocytes Absolute: 0.9
Monocytes Relative: 7
Neutro Abs: 6.9

## 2011-04-03 LAB — CROSSMATCH: Antibody Screen: NEGATIVE

## 2011-04-03 LAB — URINALYSIS, ROUTINE W REFLEX MICROSCOPIC
Bilirubin Urine: NEGATIVE
Glucose, UA: >1000 — AB
Ketones, ur: NEGATIVE
Specific Gravity, Urine: 1.023
pH: 5.5

## 2011-04-03 LAB — BASIC METABOLIC PANEL
GFR calc non Af Amer: 57 — ABNORMAL LOW
Potassium: 3.9
Sodium: 135

## 2011-04-03 LAB — GLUCOSE, CAPILLARY
Glucose-Capillary: 124 — ABNORMAL HIGH
Glucose-Capillary: 156 — ABNORMAL HIGH
Glucose-Capillary: 195 — ABNORMAL HIGH
Glucose-Capillary: 201 — ABNORMAL HIGH
Glucose-Capillary: 202 — ABNORMAL HIGH
Glucose-Capillary: 209 — ABNORMAL HIGH
Glucose-Capillary: 253 — ABNORMAL HIGH

## 2011-04-03 LAB — LIPASE, BLOOD
Lipase: 24
Lipase: 31

## 2011-04-03 LAB — HEMOGLOBIN AND HEMATOCRIT, BLOOD
HCT: 44.5
Hemoglobin: 14.5

## 2011-04-03 LAB — ABO/RH: ABO/RH(D): O POS

## 2011-04-04 LAB — CBC
HCT: 35.5 — ABNORMAL LOW
HCT: 39.2
Hemoglobin: 12 — ABNORMAL LOW
Hemoglobin: 13
MCHC: 33.1
MCHC: 33.7
MCV: 94.8
Platelets: 141 — ABNORMAL LOW
RBC: 3.74 — ABNORMAL LOW
RBC: 4.18 — ABNORMAL LOW
RDW: 14.6
RDW: 15.2
WBC: 8.7

## 2011-04-04 LAB — BASIC METABOLIC PANEL
BUN: 7
CO2: 26
Calcium: 8.8
Chloride: 106
Creatinine, Ser: 1.08
GFR calc Af Amer: >60
GFR calc non Af Amer: >60
Glucose, Bld: 158 — ABNORMAL HIGH
Potassium: 3.9
Sodium: 138

## 2011-04-04 LAB — COMPREHENSIVE METABOLIC PANEL
ALT: 22
AST: 20
Albumin: 2.2 — ABNORMAL LOW
CO2: 31
Calcium: 8.4
Creatinine, Ser: 1.28
GFR calc Af Amer: >60
GFR calc non Af Amer: 55 — ABNORMAL LOW
Sodium: 139
Total Protein: 5.6 — ABNORMAL LOW

## 2011-04-04 LAB — GLUCOSE, CAPILLARY
Glucose-Capillary: 126 — ABNORMAL HIGH
Glucose-Capillary: 153 — ABNORMAL HIGH
Glucose-Capillary: 155 — ABNORMAL HIGH
Glucose-Capillary: 158 — ABNORMAL HIGH
Glucose-Capillary: 193 — ABNORMAL HIGH
Glucose-Capillary: 196 — ABNORMAL HIGH
Glucose-Capillary: 203 — ABNORMAL HIGH

## 2011-05-03 ENCOUNTER — Emergency Department (HOSPITAL_COMMUNITY): Payer: Medicare Other

## 2011-05-03 ENCOUNTER — Emergency Department (HOSPITAL_COMMUNITY)
Admission: EM | Admit: 2011-05-03 | Discharge: 2011-05-03 | Disposition: A | Discharge status: Home / Self Care | Payer: Medicare Other | Attending: Emergency Medicine | Admitting: Emergency Medicine

## 2011-05-03 DIAGNOSIS — Z9889 Other specified postprocedural states: Secondary | ICD-10-CM | POA: Insufficient documentation

## 2011-05-03 DIAGNOSIS — E785 Hyperlipidemia, unspecified: Secondary | ICD-10-CM | POA: Insufficient documentation

## 2011-05-03 DIAGNOSIS — J4489 Other specified chronic obstructive pulmonary disease: Secondary | ICD-10-CM | POA: Insufficient documentation

## 2011-05-03 DIAGNOSIS — E119 Type 2 diabetes mellitus without complications: Secondary | ICD-10-CM | POA: Insufficient documentation

## 2011-05-03 DIAGNOSIS — H5710 Ocular pain, unspecified eye: Secondary | ICD-10-CM | POA: Insufficient documentation

## 2011-05-03 DIAGNOSIS — W208XXA Other cause of strike by thrown, projected or falling object, initial encounter: Secondary | ICD-10-CM | POA: Insufficient documentation

## 2011-05-03 DIAGNOSIS — H5789 Other specified disorders of eye and adnexa: Secondary | ICD-10-CM | POA: Insufficient documentation

## 2011-05-03 DIAGNOSIS — S0003XA Contusion of scalp, initial encounter: Secondary | ICD-10-CM | POA: Insufficient documentation

## 2011-05-03 DIAGNOSIS — I10 Essential (primary) hypertension: Secondary | ICD-10-CM | POA: Insufficient documentation

## 2011-05-03 DIAGNOSIS — S0510XA Contusion of eyeball and orbital tissues, unspecified eye, initial encounter: Secondary | ICD-10-CM | POA: Insufficient documentation

## 2011-05-03 DIAGNOSIS — Y92009 Unspecified place in unspecified non-institutional (private) residence as the place of occurrence of the external cause: Secondary | ICD-10-CM | POA: Insufficient documentation

## 2011-05-03 DIAGNOSIS — Z7982 Long term (current) use of aspirin: Secondary | ICD-10-CM | POA: Insufficient documentation

## 2011-05-03 DIAGNOSIS — J449 Chronic obstructive pulmonary disease, unspecified: Secondary | ICD-10-CM | POA: Insufficient documentation

## 2011-07-09 ENCOUNTER — Other Ambulatory Visit: Payer: Self-pay

## 2011-07-09 ENCOUNTER — Observation Stay (HOSPITAL_COMMUNITY)
Admission: EM | Admit: 2011-07-09 | Discharge: 2011-07-12 | Disposition: A | Discharge status: Home / Self Care | Payer: Medicare Other | Attending: Internal Medicine | Admitting: Internal Medicine

## 2011-07-09 ENCOUNTER — Emergency Department (HOSPITAL_COMMUNITY): Payer: Medicare Other

## 2011-07-09 ENCOUNTER — Encounter (HOSPITAL_COMMUNITY): Payer: Self-pay | Admitting: *Deleted

## 2011-07-09 DIAGNOSIS — IMO0001 Reserved for inherently not codable concepts without codable children: Secondary | ICD-10-CM | POA: Insufficient documentation

## 2011-07-09 DIAGNOSIS — E785 Hyperlipidemia, unspecified: Secondary | ICD-10-CM | POA: Insufficient documentation

## 2011-07-09 DIAGNOSIS — R5381 Other malaise: Secondary | ICD-10-CM | POA: Insufficient documentation

## 2011-07-09 DIAGNOSIS — R05 Cough: Secondary | ICD-10-CM | POA: Insufficient documentation

## 2011-07-09 DIAGNOSIS — E1165 Type 2 diabetes mellitus with hyperglycemia: Secondary | ICD-10-CM | POA: Diagnosis present

## 2011-07-09 DIAGNOSIS — R000 Tachycardia, unspecified: Secondary | ICD-10-CM | POA: Insufficient documentation

## 2011-07-09 DIAGNOSIS — R079 Chest pain, unspecified: Principal | ICD-10-CM | POA: Diagnosis present

## 2011-07-09 DIAGNOSIS — R059 Cough, unspecified: Secondary | ICD-10-CM | POA: Insufficient documentation

## 2011-07-09 DIAGNOSIS — E669 Obesity, unspecified: Secondary | ICD-10-CM | POA: Insufficient documentation

## 2011-07-09 DIAGNOSIS — R0602 Shortness of breath: Secondary | ICD-10-CM | POA: Diagnosis present

## 2011-07-09 DIAGNOSIS — I10 Essential (primary) hypertension: Secondary | ICD-10-CM | POA: Insufficient documentation

## 2011-07-09 DIAGNOSIS — J209 Acute bronchitis, unspecified: Secondary | ICD-10-CM | POA: Insufficient documentation

## 2011-07-09 HISTORY — DX: Obesity, unspecified: E66.9

## 2011-07-09 LAB — LIPID PANEL
Cholesterol: 228 mg/dL — ABNORMAL HIGH (ref 0–200)
Total CHOL/HDL Ratio: 4.8 RATIO

## 2011-07-09 LAB — CARDIAC PANEL(CRET KIN+CKTOT+MB+TROPI)
CK, MB: 2.2 ng/mL (ref 0.3–4.0)
Relative Index: 0.5 (ref 0.0–2.5)
Total CK: 476 U/L — ABNORMAL HIGH (ref 7–232)
Troponin I: <0.3 ng/mL (ref <0.30)

## 2011-07-09 LAB — DIFFERENTIAL
Basophils Absolute: 0 10*3/uL (ref 0.0–0.1)
Basophils Relative: 1 % (ref 0–1)
Eosinophils Absolute: 0.2 10*3/uL (ref 0.0–0.7)
Eosinophils Relative: 2 % (ref 0–5)
Lymphs Abs: 1.3 10*3/uL (ref 0.7–4.0)
Monocytes Absolute: 1 10*3/uL (ref 0.1–1.0)
Neutrophils Relative %: 73 % (ref 43–77)

## 2011-07-09 LAB — PROTIME-INR
INR: 0.97 (ref 0.00–1.49)
Prothrombin Time: 13.1 seconds (ref 11.6–15.2)

## 2011-07-09 LAB — CBC
HCT: 47.8 % (ref 39.0–52.0)
MCH: 31.2 pg (ref 26.0–34.0)
MCV: 91.4 fL (ref 78.0–100.0)
Platelets: 141 10*3/uL — ABNORMAL LOW (ref 150–400)
RDW: 14.4 % (ref 11.5–15.5)

## 2011-07-09 LAB — BASIC METABOLIC PANEL
Calcium: 9.3 mg/dL (ref 8.4–10.5)
Creatinine, Ser: 1 mg/dL (ref 0.50–1.35)
GFR calc non Af Amer: 69 mL/min — ABNORMAL LOW (ref >90)
Glucose, Bld: 231 mg/dL — ABNORMAL HIGH (ref 70–99)
Sodium: 134 mEq/L — ABNORMAL LOW (ref 135–145)

## 2011-07-09 LAB — COMPREHENSIVE METABOLIC PANEL
ALT: 24 U/L (ref 0–53)
CO2: 24 mEq/L (ref 19–32)
Calcium: 9.1 mg/dL (ref 8.4–10.5)
Chloride: 98 mEq/L (ref 96–112)
Creatinine, Ser: 1.02 mg/dL (ref 0.50–1.35)
GFR calc Af Amer: 79 mL/min — ABNORMAL LOW (ref >90)
GFR calc non Af Amer: 68 mL/min — ABNORMAL LOW (ref >90)
Glucose, Bld: 214 mg/dL — ABNORMAL HIGH (ref 70–99)
Total Bilirubin: 0.3 mg/dL (ref 0.3–1.2)

## 2011-07-09 LAB — TROPONIN I: Troponin I: <0.3 ng/mL (ref <0.30)

## 2011-07-09 LAB — CULTURE, BLOOD (ROUTINE X 2)
Culture  Setup Time: 201301070227
Culture: NO GROWTH

## 2011-07-09 LAB — PHOSPHORUS: Phosphorus: 2.5 mg/dL (ref 2.3–4.6)

## 2011-07-09 MED ORDER — INSULIN ASPART 100 UNIT/ML ~~LOC~~ SOLN
0.0000 [IU] | Freq: Every day | SUBCUTANEOUS | Status: DC
  Administered 2011-07-10: 2 [IU] via SUBCUTANEOUS
  Administered 2011-07-11: 3 [IU] via SUBCUTANEOUS
  Filled 2011-07-09: qty 3

## 2011-07-09 MED ORDER — METFORMIN HCL 500 MG PO TABS
1000.0000 mg | ORAL_TABLET | Freq: Two times a day (BID) | ORAL | Status: DC
  Administered 2011-07-09: 1000 mg via ORAL
  Filled 2011-07-09 (×4): qty 2

## 2011-07-09 MED ORDER — ALBUTEROL SULFATE (5 MG/ML) 0.5% IN NEBU
2.5000 mg | INHALATION_SOLUTION | Freq: Once | RESPIRATORY_TRACT | Status: AC
  Administered 2011-07-09: 2.5 mg via RESPIRATORY_TRACT
  Filled 2011-07-09: qty 0.5

## 2011-07-09 MED ORDER — DEXTROSE 5 % IV SOLN
500.0000 mg | INTRAVENOUS | Status: DC
  Administered 2011-07-09: 500 mg via INTRAVENOUS
  Filled 2011-07-09 (×2): qty 500

## 2011-07-09 MED ORDER — INSULIN ASPART 100 UNIT/ML ~~LOC~~ SOLN
0.0000 [IU] | Freq: Three times a day (TID) | SUBCUTANEOUS | Status: DC
  Administered 2011-07-10 (×2): 5 [IU] via SUBCUTANEOUS
  Administered 2011-07-10: 2 [IU] via SUBCUTANEOUS
  Administered 2011-07-11 (×2): 5 [IU] via SUBCUTANEOUS
  Administered 2011-07-11 – 2011-07-12 (×2): 3 [IU] via SUBCUTANEOUS

## 2011-07-09 MED ORDER — IPRATROPIUM BROMIDE 0.02 % IN SOLN
0.5000 mg | Freq: Four times a day (QID) | RESPIRATORY_TRACT | Status: DC
Start: 2011-07-09 — End: 2011-07-10
  Administered 2011-07-09 – 2011-07-10 (×2): 0.5 mg via RESPIRATORY_TRACT
  Filled 2011-07-09 (×2): qty 2.5

## 2011-07-09 MED ORDER — ENOXAPARIN SODIUM 30 MG/0.3ML ~~LOC~~ SOLN: 30.0000 mg | SUBCUTANEOUS | Status: DC

## 2011-07-09 MED ORDER — ENOXAPARIN SODIUM 40 MG/0.4ML ~~LOC~~ SOLN
40.0000 mg | SUBCUTANEOUS | Status: DC
  Administered 2011-07-09 – 2011-07-11 (×3): 40 mg via SUBCUTANEOUS
  Filled 2011-07-09 (×4): qty 0.4

## 2011-07-09 MED ORDER — ASPIRIN 81 MG PO CHEW
324.0000 mg | CHEWABLE_TABLET | Freq: Once | ORAL | Status: AC
  Administered 2011-07-09: 324 mg via ORAL
  Filled 2011-07-09: qty 4

## 2011-07-09 MED ORDER — IPRATROPIUM BROMIDE 0.02 % IN SOLN
0.5000 mg | Freq: Once | RESPIRATORY_TRACT | Status: AC
  Administered 2011-07-09: 0.5 mg via RESPIRATORY_TRACT
  Filled 2011-07-09: qty 2.5

## 2011-07-09 MED ORDER — SODIUM CHLORIDE 0.9 % IV BOLUS (SEPSIS)
1000.0000 mL | Freq: Once | INTRAVENOUS | Status: AC
  Administered 2011-07-09: 1000 mL via INTRAVENOUS

## 2011-07-09 MED ORDER — PANTOPRAZOLE SODIUM 40 MG IV SOLR
40.0000 mg | INTRAVENOUS | Status: DC
  Administered 2011-07-09 – 2011-07-11 (×3): 40 mg via INTRAVENOUS
  Filled 2011-07-09 (×4): qty 40

## 2011-07-09 MED ORDER — OXYCODONE-ACETAMINOPHEN 5-325 MG PO TABS
1.0000 | ORAL_TABLET | ORAL | Status: DC | PRN
  Administered 2011-07-09 – 2011-07-11 (×4): 1 via ORAL
  Filled 2011-07-09 (×4): qty 1

## 2011-07-09 MED ORDER — ACETAMINOPHEN 325 MG PO TABS
650.0000 mg | ORAL_TABLET | ORAL | Status: DC | PRN
  Administered 2011-07-09: 325 mg via ORAL
  Filled 2011-07-09: qty 1

## 2011-07-09 MED ORDER — ASPIRIN EC 81 MG PO TBEC
81.0000 mg | DELAYED_RELEASE_TABLET | Freq: Every day | ORAL | Status: DC
  Administered 2011-07-10 – 2011-07-12 (×3): 81 mg via ORAL
  Filled 2011-07-09 (×4): qty 1

## 2011-07-09 NOTE — ED Notes (Signed)
Pt states feeling better, denies headache, sore throat is "not bad" and cough improved after neb. Iv team unsuccessful with iv attempt. Lab was able to get blood.

## 2011-07-09 NOTE — ED Notes (Addendum)
Gave report to [place], RN on 3700.  Informed Noreene Larsson of patient's limited IV access for CT scan (IV team had to attempt IV access seven times before successful IV in team and states that he has no access for CT Angio IV).  Was told by previous nurse that patient would not have accurate results from nuclear medicine scan due to fibrotic lung.  CT called about patient's IV access; CT has been given Dr. Armando Gang pager number to notify physician of situation and patient's limited IV access. Informed Noreene Larsson, RN that admitting MD did not enter orders under "sign and held orders" and that all orders have already been released.  Informed Noreene Larsson, RN azithromycin will be started before patient is sent upstairs.  RN has no further questions or concerns.  Preparing patient for transport.

## 2011-07-09 NOTE — ED Provider Notes (Signed)
Patient's a mildly elevated d-dimer.  When reviewing previous chart he had elevated d-dimer with a negative CT angiography last 2 years.  I suspect that he has tach arrhythmias may be secondary to his albuterol.  Before the albuterol treatments his rate was running around the 103.  Patient clinically seems to be feeling better.  Despite this I believe he is too high risk to send home.  I contacted the hospitalist service and he will be admitted to a telemetry floor.  Nelia Shi, MD 07/09/11 1840

## 2011-07-09 NOTE — ED Provider Notes (Signed)
History     CSN: [id number]  Arrival date & time 07/09/11  1235   First MD Initiated Contact with Patient 07/09/11 1305      Chief Complaint  Patient presents with  . Chest Pain    (Consider location/radiation/quality/duration/timing/severity/associated sxs/prior treatment) Patient is a 78 y.o. male presenting with chest pain. The history is provided by the patient.  Chest Pain    for the last week, he has had a nonproductive cough. Over about the same time, he has had a sharp, burning pain in the left anterolateral chest. Pain is moderate to severe and he rates it at 9/10. It is worse when he coughs and worsening to lays on his left side. Nothing makes it better. There is no associated dyspnea, nausea, diaphoresis. He did have one episode of post tussive emesis. Nothing makes his symptoms better nothing makes it worse. He is complaining that his throat is raw from coughing. He denies any sick contacts.  Past Medical History  Diagnosis Date  . Diabetes mellitus   . Hypertension   . Obesity     History reviewed. No pertinent past surgical history.  History reviewed. No pertinent family history.  History  Substance Use Topics  . Smoking status: Never Smoker   . Smokeless tobacco: Not on file  . Alcohol Use: No      Review of Systems  Cardiovascular: Positive for chest pain.  All other systems reviewed and are negative.    Allergies  Review of patient's allergies indicates no known allergies.  Home Medications   Current Outpatient Rx  Name Route Sig Dispense Refill  . ASPIRIN 81 MG PO TABS Oral Take 81 mg by mouth daily.      Marland Kitchen METFORMIN HCL 1000 MG PO TABS Oral Take 1,000 mg by mouth 2 (two) times daily with a meal.      . OXYCODONE-ACETAMINOPHEN 5-325 MG PO TABS Oral Take 1 tablet by mouth every 4 (four) hours as needed. pain       BP 142/93  Pulse 118  Temp(Src) 98.8 F (37.1 C) (Oral)  Resp 23  SpO2 93%  Physical Exam  Nursing note and vitals  reviewed.  78 year old male who is resting comfortably and in no acute distress. Vital signs are significant for mild hypertension with blood pressure 142/93, moderate tachycardia with heart rate 118, mild tachypnea with respiratory rate of 23. Oxygen saturation is 93% which is in the low-normal range. Head is normocephalic and atraumatic. PERRLA, EOMI. Oropharynx is clear. Neck is supple without adenopathy. JVD is noted at 45. Lungs have fine rales at the right base. Prolonged exhalation phase is noted without definite wheezes or rhonchi. Back is nontender. Heart has regular rate rhythm without murmur. There is moderate tenderness to palpation in the left anterolateral chest wall. Abdomen is soft, flat, nontender without masses or hepatosplenomegaly. Extremities have no cyanosis, and full range of motion is present. There is 1+ pitting edema present. Skin is warm and moist without rash. Neurologic: Mental status is normal, cranial nerves are intact, there no focal motor or sensory deficits. Psychiatric: No abnormalities of mood or affect.  ED Course  Procedures (including critical care time)  Labs Reviewed  GLUCOSE, CAPILLARY - Abnormal; Notable for the following:    Glucose-Capillary 238 (*)    All other components within normal limits   Results for orders placed during the hospital encounter of 07/09/11  GLUCOSE, CAPILLARY      Component Value Range   Glucose-Capillary 238 (*)  70 - 99 (mg/dL)  CBC      Component Value Range   WBC 9.8  4.0 - 10.5 (K/uL)   RBC 5.23  4.22 - 5.81 (MIL/uL)   Hemoglobin 16.3  13.0 - 17.0 (g/dL)   HCT 16.1  09.6 - 04.5 (%)   MCV 91.4  78.0 - 100.0 (fL)   MCH 31.2  26.0 - 34.0 (pg)   MCHC 34.1  30.0 - 36.0 (g/dL)   RDW 40.9  81.1 - 91.4 (%)   Platelets 141 (*) 150 - 400 (K/uL)  DIFFERENTIAL      Component Value Range   Neutrophils Relative 75  43 - 77 (%)   Neutro Abs 7.3  1.7 - 7.7 (K/uL)   Lymphocytes Relative 13  12 - 46 (%)   Lymphs Abs 1.3  0.7 - 4.0  (K/uL)   Monocytes Relative 10  3 - 12 (%)   Monocytes Absolute 1.0  0.1 - 1.0 (K/uL)   Eosinophils Relative 2  0 - 5 (%)   Eosinophils Absolute 0.2  0.0 - 0.7 (K/uL)   Basophils Relative 0  0 - 1 (%)   Basophils Absolute 0.0  0.0 - 0.1 (K/uL)  BASIC METABOLIC PANEL      Component Value Range   Sodium 134 (*) 135 - 145 (mEq/L)   Potassium 4.2  3.5 - 5.1 (mEq/L)   Chloride 99  96 - 112 (mEq/L)   CO2 23  19 - 32 (mEq/L)   Glucose, Bld 231 (*) 70 - 99 (mg/dL)   BUN 10  6 - 23 (mg/dL)   Creatinine, Ser 7.82  0.50 - 1.35 (mg/dL)   Calcium 9.3  8.4 - 95.6 (mg/dL)   GFR calc non Af Amer 69 (*) >90 (mL/min)   GFR calc Af Amer 80 (*) >90 (mL/min)  PRO B NATRIURETIC PEPTIDE      Component Value Range   Pro B Natriuretic peptide (BNP) 73.3  0 - 450 (pg/mL)  TROPONIN I      Component Value Range   Troponin I <0.30  <0.30 (ng/mL)   Dg Chest 2 View  07/09/2011  *RADIOLOGY REPORT*  Clinical Data: Cough, congestion, fever  CHEST - 2 VIEW  Comparison: CT chest dated 11/02/2010  Findings: Chronic interstitial markings.  No frank interstitial edema. No pleural effusion or pneumothorax.  Stable cardiomegaly.  Mild degenerative changes of the visualized thoracolumbar spine.  IMPRESSION: No evidence of acute cardiopulmonary disease.  Chronic interstitial markings, stable from prior studies.  Stable cardiomegaly.  Original Report Authenticated By: Charline Bills, M.D.     No results found.  Date: 07/09/2011  Rate: 112  Rhythm: sinus tachycardia and premature ventricular contractions (PVC)  QRS Axis: normal  Intervals: normal  ST/T Wave abnormalities: nonspecific T wave changes  Conduction Disutrbances:first-degree A-V block   Narrative Interpretation: First degree AV block. Nonspecific T wave flattening in the anterolateral leads which is new compared with ECG of 11/02/2010.  Old EKG Reviewed: changes noted    No diagnosis found.  To improve, however he continued to have tachycardia. He was  observed in the emergency department, but the tachycardia persisted. On reexamination, there was some ongoing wheezing in his lung bases. He is being given a second albuterol treatment and being given a bolus of saline. To be reevaluated after the albuterol and saline.  MDM  Chest pain appears to be musculoskeletal and related to persistent cough. Cough most likely represents acute bronchitis, but need to rule out pneumonia. Chest  x-ray and cardiac markers have been ordered. He'll be given an albuterol nebulizer treatment empirically. Additional dose of aspirin is given.        Dione Booze, MD 07/09/11 1651

## 2011-07-09 NOTE — ED Notes (Addendum)
Patient being transported upstairs on portable cardiac monitor with RN.  Dr. Elisabeth Pigeon paged to notify patient of IV situation; still waiting for call back.  Placed mask on patient for transport (due to droplet precautions).

## 2011-07-09 NOTE — ED Notes (Signed)
Pt's CBG was 238 when I checked it. 1:08 pm JG. I also filled out a sheet and put in Miguel Dibble folder in [place] lab. 1:09 pm JG.

## 2011-07-09 NOTE — H&P (Addendum)
PCP:  Jarome Matin, MD, MD   DOA:  07/09/2011 12:38 PM  Chief Complaint:  Shortness of breath  HPI: 78 year old male with history of diabetes presents to ED with complaints of left sided chest pain, sharp, 9/10 in intensity, associated with movement, non radiating. No associated palpitations and no nausea or vomiting, no left arm numbness or tingling. No complaints of abdominal pain, blood in stool or urine. Patient does report being short of breath over 1 week prior to admission which is when chest pain started. He also reports non productive cough and no fever or chills, no lightheadedness or dizziness or loss of consciousness. No sick contacts at home.  Allergies: No Known Allergies  Prior to Admission medications   Medication Sig Start Date End Date Taking? Authorizing Provider  aspirin 81 MG tablet Take 81 mg by mouth daily.     Yes Historical Provider, MD  metFORMIN (GLUCOPHAGE) 1000 MG tablet Take 1,000 mg by mouth 2 (two) times daily with a meal.     Yes Historical Provider, MD  oxyCODONE-acetaminophen (PERCOCET) 5-325 MG per tablet Take 1 tablet by mouth every 4 (four) hours as needed. pain    Yes Historical Provider, MD    Past Medical History  Diagnosis Date  . Diabetes mellitus   . Hypertension   . Obesity     History reviewed. No pertinent past surgical history.  Social History:  reports that he has never smoked. He does not have any smokeless tobacco history on file. He reports that he does not drink alcohol or use illicit drugs.  History reviewed. No pertinent family history.  Review of Systems:  Constitutional: Denies fever, chills, diaphoresis, appetite change and fatigue.  HEENT: Denies photophobia, eye pain, redness, hearing loss, ear pain, congestion, sore throat, rhinorrhea, sneezing, mouth sores, trouble swallowing, neck pain, neck stiffness and tinnitus.   Respiratory: Denies SOB, DOE, cough, chest tightness,  and wheezing.   Cardiovascular: Denies  chest pain, palpitations and leg swelling.  Gastrointestinal: Denies nausea, vomiting, abdominal pain, diarrhea, constipation, blood in stool and abdominal distention.  Genitourinary: Denies dysuria, urgency, frequency, hematuria, flank pain and difficulty urinating.  Musculoskeletal: Denies myalgias, back pain, joint swelling, arthralgias and gait problem.  Skin: Denies pallor, rash and wound.  Neurological: Denies dizziness, seizures, syncope, weakness, light-headedness, numbness and headaches.  Hematological: Denies adenopathy. Easy bruising, personal or family bleeding history  Psychiatric/Behavioral: Denies suicidal ideation, mood changes, confusion, nervousness, sleep disturbance and agitation   Physical Exam:  Filed Vitals:   07/09/11 1630 07/09/11 1700 07/09/11 1730 07/09/11 1800  BP: 138/80 124/109 143/79 131/87  Pulse: 132 137 140 136  Temp:      TempSrc:      Resp: 23 20 24 26   SpO2: 95% 97% 96% 93%    Constitutional: Vital signs reviewed.  Patient is a well-developed and well-nourished in no acute distress and cooperative with exam. Alert and oriented x3.  Head: Normocephalic and atraumatic Ear: TM normal bilaterally Mouth: no erythema or exudates, MMM Eyes: PERRL, EOMI, conjunctivae normal, No scleral icterus.  Neck: Supple, Trachea midline normal ROM, No JVD, mass, thyromegaly, or carotid bruit present.  Cardiovascular: RRR, S1 normal, S2 normal, no MRG, pulses symmetric and intact bilaterally Pulmonary/Chest: CTAB, no wheezes, rales, or rhonchi Abdominal: Soft. Non-tender, non-distended, bowel sounds are normal, no masses, organomegaly, or guarding present.  GU: no CVA tenderness Musculoskeletal: No joint deformities, erythema, or stiffness, ROM full and no nontender Ext: no edema and no cyanosis, pulses  palpable bilaterally (DP and PT) Hematology: no cervical, inginal, or axillary adenopathy.  Neurological: A&O x3, Strenght is normal and symmetric bilaterally,  cranial nerve II-XII are grossly intact, no focal motor deficit, sensory intact to light touch bilaterally.  Skin: Warm, dry and intact. No rash, cyanosis, or clubbing.  Psychiatric: Normal mood and affect. speech and behavior is normal. Judgment and thought content normal. Cognition and memory are normal.   Labs on Admission:  Results for orders placed during the hospital encounter of 07/09/11 (from the past 48 hour(s))  GLUCOSE, CAPILLARY     Status: Abnormal   Collection Time   07/09/11  1:05 PM      Component Value Range Comment   Glucose-Capillary 238 (*) 70 - 99 (mg/dL)   CBC     Status: Abnormal   Collection Time   07/09/11  2:58 PM      Component Value Range Comment   WBC 9.8  4.0 - 10.5 (K/uL)    RBC 5.23  4.22 - 5.81 (MIL/uL)    Hemoglobin 16.3  13.0 - 17.0 (g/dL)    HCT 16.1  09.6 - 04.5 (%)    MCV 91.4  78.0 - 100.0 (fL)    MCH 31.2  26.0 - 34.0 (pg)    MCHC 34.1  30.0 - 36.0 (g/dL)    RDW 40.9  81.1 - 91.4 (%)    Platelets 141 (*) 150 - 400 (K/uL)   DIFFERENTIAL     Status: Normal   Collection Time   07/09/11  2:58 PM      Component Value Range Comment   Neutrophils Relative 75  43 - 77 (%)    Neutro Abs 7.3  1.7 - 7.7 (K/uL)    Lymphocytes Relative 13  12 - 46 (%)    Lymphs Abs 1.3  0.7 - 4.0 (K/uL)    Monocytes Relative 10  3 - 12 (%)    Monocytes Absolute 1.0  0.1 - 1.0 (K/uL)    Eosinophils Relative 2  0 - 5 (%)    Eosinophils Absolute 0.2  0.0 - 0.7 (K/uL)    Basophils Relative 0  0 - 1 (%)    Basophils Absolute 0.0  0.0 - 0.1 (K/uL)   BASIC METABOLIC PANEL     Status: Abnormal   Collection Time   07/09/11  2:58 PM      Component Value Range Comment   Sodium 134 (*) 135 - 145 (mEq/L)    Potassium 4.2  3.5 - 5.1 (mEq/L)    Chloride 99  96 - 112 (mEq/L)    CO2 23  19 - 32 (mEq/L)    Glucose, Bld 231 (*) 70 - 99 (mg/dL)    BUN 10  6 - 23 (mg/dL)    Creatinine, Ser 7.82  0.50 - 1.35 (mg/dL)    Calcium 9.3  8.4 - 10.5 (mg/dL)    GFR calc non Af Amer 69 (*) >90  (mL/min)    GFR calc Af Amer 80 (*) >90 (mL/min)   PRO B NATRIURETIC PEPTIDE     Status: Normal   Collection Time   07/09/11  2:59 PM      Component Value Range Comment   Pro B Natriuretic peptide (BNP) 73.3  0 - 450 (pg/mL)   TROPONIN I     Status: Normal   Collection Time   07/09/11  3:00 PM      Component Value Range Comment   Troponin I <0.30  <0.30 (ng/mL)  D-DIMER, QUANTITATIVE     Status: Abnormal   Collection Time   07/09/11  5:07 PM      Component Value Range Comment   D-Dimer, Quant 0.99 (*) 0.00 - 0.48 (ug/mL-FEU)   COMPREHENSIVE METABOLIC PANEL     Status: Abnormal (Preliminary result)   Collection Time   07/09/11  6:58 PM      Component Value Range Comment   Sodium 133 (*) 135 - 145 (mEq/L)    Potassium 4.3  3.5 - 5.1 (mEq/L)    Chloride 98  96 - 112 (mEq/L)    CO2 24  19 - 32 (mEq/L)    Glucose, Bld 214 (*) 70 - 99 (mg/dL)    BUN 9  6 - 23 (mg/dL)    Creatinine, Ser 1.61  0.50 - 1.35 (mg/dL)    Calcium 9.1  8.4 - 10.5 (mg/dL)    Total Protein 7.6  6.0 - 8.3 (g/dL)    Albumin 3.3 (*) 3.5 - 5.2 (g/dL)    AST PENDING  0 - 37 (U/L)    ALT 24  0 - 53 (U/L)    Alkaline Phosphatase 93  39 - 117 (U/L)    Total Bilirubin 0.3  0.3 - 1.2 (mg/dL)    GFR calc non Af Amer 68 (*) >90 (mL/min)    GFR calc Af Amer 79 (*) >90 (mL/min)   MAGNESIUM     Status: Normal   Collection Time   07/09/11  6:58 PM      Component Value Range Comment   Magnesium 1.7  1.5 - 2.5 (mg/dL)   PHOSPHORUS     Status: Normal   Collection Time   07/09/11  6:58 PM      Component Value Range Comment   Phosphorus 2.5  2.3 - 4.6 (mg/dL)   DIFFERENTIAL     Status: Normal   Collection Time   07/09/11  6:58 PM      Component Value Range Comment   Neutrophils Relative 73  43 - 77 (%)    Lymphocytes Relative 14  12 - 46 (%)    Monocytes Relative 11  3 - 12 (%)    Eosinophils Relative 1  0 - 5 (%)    Basophils Relative 1  0 - 1 (%)   APTT     Status: Normal   Collection Time   07/09/11  6:58 PM      Component  Value Range Comment   aPTT 26  24 - 37 (seconds)   PROTIME-INR     Status: Normal   Collection Time   07/09/11  6:58 PM      Component Value Range Comment   Prothrombin Time 13.1  11.6 - 15.2 (seconds)    INR 0.97  0.00 - 1.49    CARDIAC PANEL(CRET KIN+CKTOT+MB+TROPI)     Status: Abnormal   Collection Time   07/09/11  6:59 PM      Component Value Range Comment   Total CK 476 (*) 7 - 232 (U/L)    CK, MB 2.2  0.3 - 4.0 (ng/mL)    Troponin I <0.30  <0.30 (ng/mL)    Relative Index 0.5  0.0 - 2.5      Radiological Exams on Admission: No results found.  Assessment/Plan  Principal Problem:   *Chest pain - rule out ACS  /MI - first set of cardiac enzymes is negative - nitroglycerin for pain relief - follow up lipid panel, A1c  Active Problems:   Shortness  of breath - in setting of elevated D DImer concern is for PE, CT chest ordered in ED but patient refused to have IV line as several attempts were unsuccesful - provide nebulizers as needed for shortness of breath - azithromycin for possible bronchitis or pneumonia - saturating well on room air - DVT prophylaxis - further management dependent on initial coarse of treatment and how patient responds to it clinically  Tachycardia - perhaps secondary to albuterol - CT ordered but not done as patient refused to have IV line put in as several attempts unsuccesful - may try in am again  Time Spent on Admission: Greater than 30 minutes  DEVINE, ALMA 07/09/2011, 7:54 PM

## 2011-07-09 NOTE — ED Notes (Signed)
Lab at bedside

## 2011-07-09 NOTE — ED Notes (Signed)
Another iv team member here to attempt access.

## 2011-07-09 NOTE — ED Notes (Signed)
Pt reports left side sharp chest pains that started yesterday, increase with movement. Reports recent cough and now having sore throat. ekg done at triage, no distress noted.

## 2011-07-09 NOTE — ED Notes (Signed)
Received bedside report from Jonny Ruiz, [place].  Patient currently sitting up in bed; no respiratory or acute distress noted.  Patient updated on plan of care; informed patient that a bed is available and report will be called as soon as shift change is over.  Patient has no other questions or concerns at this time.  Updated whiteboard in room and introduced self to patient.  Will continue to monitor.

## 2011-07-09 NOTE — ED Notes (Signed)
Calling report now. 

## 2011-07-09 NOTE — ED Notes (Signed)
Attempting to call report again.

## 2011-07-09 NOTE — ED Notes (Signed)
Attempted to call report; was told that the nurse is still receiving report at this time.  Left name and number for RN to call back.

## 2011-07-09 NOTE — ED Notes (Signed)
Pt continues with tachycardia, denies chest pain and sob but is breathing 26/min. o2 sat is 98% on 2lnc., lungs clear at present. Denies headache but states throat is still " a little sore."

## 2011-07-10 ENCOUNTER — Inpatient Hospital Stay (HOSPITAL_COMMUNITY): Payer: Medicare Other

## 2011-07-10 ENCOUNTER — Encounter (HOSPITAL_COMMUNITY): Payer: Self-pay | Admitting: *Deleted

## 2011-07-10 DIAGNOSIS — IMO0002 Reserved for concepts with insufficient information to code with codable children: Secondary | ICD-10-CM | POA: Diagnosis present

## 2011-07-10 DIAGNOSIS — I10 Essential (primary) hypertension: Secondary | ICD-10-CM | POA: Diagnosis present

## 2011-07-10 DIAGNOSIS — E1165 Type 2 diabetes mellitus with hyperglycemia: Secondary | ICD-10-CM | POA: Diagnosis present

## 2011-07-10 LAB — CARDIAC PANEL(CRET KIN+CKTOT+MB+TROPI)
CK, MB: 3.4 ng/mL (ref 0.3–4.0)
CK, MB: 3.5 ng/mL (ref 0.3–4.0)
Relative Index: 0.2 (ref 0.0–2.5)
Relative Index: 0.1 (ref 0.0–2.5)
Total CK: 2238 U/L — ABNORMAL HIGH (ref 7–232)
Troponin I: <0.3 ng/mL (ref <0.30)
Troponin I: <0.3 ng/mL (ref <0.30)

## 2011-07-10 LAB — BASIC METABOLIC PANEL
Calcium: 8.7 mg/dL (ref 8.4–10.5)
Creatinine, Ser: 1.13 mg/dL (ref 0.50–1.35)
GFR calc non Af Amer: 60 mL/min — ABNORMAL LOW (ref >90)
Glucose, Bld: 204 mg/dL — ABNORMAL HIGH (ref 70–99)
Sodium: 133 mEq/L — ABNORMAL LOW (ref 135–145)

## 2011-07-10 LAB — GLUCOSE, CAPILLARY
Glucose-Capillary: 135 mg/dL — ABNORMAL HIGH (ref 70–99)
Glucose-Capillary: 185 mg/dL — ABNORMAL HIGH (ref 70–99)
Glucose-Capillary: 217 mg/dL — ABNORMAL HIGH (ref 70–99)
Glucose-Capillary: 232 mg/dL — ABNORMAL HIGH (ref 70–99)

## 2011-07-10 LAB — TSH: TSH: 0.769 u[IU]/mL (ref 0.350–4.500)

## 2011-07-10 LAB — CBC
MCH: 30.9 pg (ref 26.0–34.0)
MCHC: 34 g/dL (ref 30.0–36.0)
MCV: 90.9 fL (ref 78.0–100.0)
Platelets: 144 10*3/uL — ABNORMAL LOW (ref 150–400)
RBC: 5.05 MIL/uL (ref 4.22–5.81)
RDW: 14.7 % (ref 11.5–15.5)

## 2011-07-10 LAB — INFLUENZA PANEL BY PCR (TYPE A & B): H1N1 flu by pcr: NOT DETECTED

## 2011-07-10 LAB — HEMOGLOBIN A1C
Hgb A1c MFr Bld: 9.7 % — ABNORMAL HIGH (ref <5.7)
Mean Plasma Glucose: 232 mg/dL — ABNORMAL HIGH (ref <117)

## 2011-07-10 MED ORDER — OSELTAMIVIR PHOSPHATE 75 MG PO CAPS
75.0000 mg | ORAL_CAPSULE | Freq: Two times a day (BID) | ORAL | Status: DC
  Filled 2011-07-10 (×2): qty 1

## 2011-07-10 MED ORDER — POTASSIUM CHLORIDE CRYS ER 20 MEQ PO TBCR
40.0000 meq | EXTENDED_RELEASE_TABLET | Freq: Once | ORAL | Status: AC
  Administered 2011-07-10: 40 meq via ORAL
  Filled 2011-07-10: qty 2

## 2011-07-10 MED ORDER — IPRATROPIUM BROMIDE 0.02 % IN SOLN: 0.5000 mg | Freq: Four times a day (QID) | RESPIRATORY_TRACT | Status: DC | PRN

## 2011-07-10 MED ORDER — IBUPROFEN 800 MG PO TABS
800.0000 mg | ORAL_TABLET | Freq: Three times a day (TID) | ORAL | Status: DC
  Administered 2011-07-10 – 2011-07-12 (×7): 800 mg via ORAL
  Filled 2011-07-10 (×9): qty 1

## 2011-07-10 MED ORDER — DM-GUAIFENESIN ER 30-600 MG PO TB12
2.0000 | ORAL_TABLET | Freq: Two times a day (BID) | ORAL | Status: DC
  Administered 2011-07-10 – 2011-07-12 (×5): 2 via ORAL
  Filled 2011-07-10 (×6): qty 2

## 2011-07-10 MED ORDER — LEVOFLOXACIN IN D5W 750 MG/150ML IV SOLN
750.0000 mg | INTRAVENOUS | Status: DC
  Administered 2011-07-10 – 2011-07-11 (×2): 750 mg via INTRAVENOUS
  Filled 2011-07-10 (×3): qty 150

## 2011-07-10 MED ORDER — MENTHOL 3 MG MT LOZG
1.0000 | LOZENGE | Freq: Three times a day (TID) | OROMUCOSAL | Status: DC | PRN
  Administered 2011-07-10 – 2011-07-11 (×3): 3 mg via ORAL
  Filled 2011-07-10: qty 9

## 2011-07-10 MED ORDER — SIMVASTATIN 20 MG PO TABS
20.0000 mg | ORAL_TABLET | Freq: Every day | ORAL | Status: DC
  Administered 2011-07-10 – 2011-07-11 (×2): 20 mg via ORAL
  Filled 2011-07-10 (×3): qty 1

## 2011-07-10 MED ORDER — MAGNESIUM SULFATE 40 MG/ML IJ SOLN
2.0000 g | Freq: Once | INTRAMUSCULAR | Status: AC
  Administered 2011-07-10: 2 g via INTRAVENOUS
  Filled 2011-07-10: qty 50

## 2011-07-10 MED ORDER — PHENOL 1.4 % MT LIQD
1.0000 | OROMUCOSAL | Status: DC | PRN
  Administered 2011-07-10 – 2011-07-11 (×4): 1 via OROMUCOSAL
  Filled 2011-07-10: qty 177

## 2011-07-10 MED ORDER — IOHEXOL 300 MG/ML  SOLN
100.0000 mL | Freq: Once | INTRAMUSCULAR | Status: AC | PRN
  Administered 2011-07-10: 100 mL via INTRAVENOUS

## 2011-07-10 NOTE — Progress Notes (Signed)
Pt has had intermittent bigeminy PVC's - pt asymptomatic.  Total CK this am 2238, MB/Trop negative, K 3.5, Mg 1.7.  Lenny Pastel text paged and awaiting return call.  Westley Chandler, Charity fundraiser.

## 2011-07-10 NOTE — Progress Notes (Signed)
Subjective: Patient states left-sided substernal chest pain with some improvement. Patient complaining of a sore throat. Patient complaining of generalized weakness and productive cough.  Objective: Vital signs in last 24 hours: Filed Vitals:   07/09/11 2218 07/10/11 0444 07/10/11 0500 07/10/11 0822  BP:   151/92   Pulse:   116   Temp: 101.3 F (38.5 C) 99.4 F (37.4 C) 98.5 F (36.9 C)   TempSrc:      Resp:   20   Height:      Weight:      SpO2:   95% 96%    Intake/Output Summary (Last 24 hours) at 07/10/11 0850 Last data filed at 07/10/11 0500  Gross per 24 hour  Intake   1000 ml  Output    300 ml  Net    700 ml    Weight change:   General: Alert, awake, oriented x3, in no acute distress. Heart: Regular rate and rhythm, without murmurs, rubs, gallops. Chest wall tender to palpation. Lungs: Clear to auscultation bilaterally. Abdomen: Soft, nontender, nondistended, positive bowel sounds. Extremities: No clubbing cyanosis or edema with positive pedal pulses. Neuro: Grossly intact, nonfocal.   Lab Results:  [facility] 07/10/11 0309 07/09/11 1858  NA 133* 133*  K 3.5 4.3  CL 98 98  CO2 25 24  GLUCOSE 204* 214*  BUN 9 9  CREATININE 1.13 1.02  CALCIUM 8.7 9.1  MG -- 1.7  PHOS -- 2.5    Basename 07/09/11 1858  AST 29  ALT 24  ALKPHOS 93  BILITOT 0.3  PROT 7.6  ALBUMIN 3.3*   No results found for this basename: LIPASE:2,AMYLASE:2 in the last 72 hours  Basename 07/10/11 0309 07/09/11 1458  WBC 8.0 9.8  NEUTROABS -- 7.3  HGB 15.6 16.3  HCT 45.9 47.8  MCV 90.9 91.4  PLT 144* 141*    Basename 07/10/11 0309 07/09/11 1859 07/09/11 1500  CKTOTAL 2238* 476* --  CKMB 3.4 2.2 --  CKMBINDEX -- -- --  TROPONINI <0.30 <0.30 <0.30   No components found with this basename: POCBNP:3  Basename 07/09/11 1707  DDIMER 0.99*    Basename 07/09/11 1858  HGBA1C 10.2*    Basename 07/09/11 1859  CHOL 228*  HDL 48  LDLCALC 161*  TRIG 97  CHOLHDL 4.8    LDLDIRECT --    Basename 07/09/11 1858  TSH 0.769  T4TOTAL --  T3FREE --  THYROIDAB --   No results found for this basename: VITAMINB12:2,FOLATE:2,FERRITIN:2,TIBC:2,IRON:2,RETICCTPCT:2 in the last 72 hours  Micro Results: Recent Results (from the past 240 hour(s))  MRSA PCR SCREENING     Status: Normal   Collection Time   07/09/11  9:16 PM      Component Value Range Status Comment   MRSA by PCR NEGATIVE  NEGATIVE  Final     Studies/Results: Dg Chest 2 View  07/09/2011  *RADIOLOGY REPORT*  Clinical Data: Cough, congestion, fever  CHEST - 2 VIEW  Comparison: CT chest dated 11/02/2010  Findings: Chronic interstitial markings.  No frank interstitial edema. No pleural effusion or pneumothorax.  Stable cardiomegaly.  Mild degenerative changes of the visualized thoracolumbar spine.  IMPRESSION: No evidence of acute cardiopulmonary disease.  Chronic interstitial markings, stable from prior studies.  Stable cardiomegaly.  Original Report Authenticated By: Charline Bills, M.D.    Medications:     . albuterol  2.5 mg Nebulization Once  . albuterol  2.5 mg Nebulization Once  . aspirin  324 mg Oral Once  . aspirin EC  81 mg Oral Daily  . dextromethorphan-guaiFENesin  2 tablet Oral BID  . enoxaparin (LOVENOX) injection  40 mg Subcutaneous Q24H  . ibuprofen  800 mg Oral TID  . insulin aspart  0-15 Units Subcutaneous TID WC  . insulin aspart  0-5 Units Subcutaneous QHS  . ipratropium  0.5 mg Nebulization Once  . ipratropium  0.5 mg Nebulization Once  . levofloxacin (LEVAQUIN) IV  750 mg Intravenous Q24H  . magnesium sulfate 1 - 4 g bolus IVPB  2 g Intravenous Once  . oseltamivir  75 mg Oral BID  . pantoprazole (PROTONIX) IV  40 mg Intravenous Q24H  . potassium chloride  40 mEq Oral Once  . simvastatin  20 mg Oral q1800  . sodium chloride  1,000 mL Intravenous Once  . DISCONTD: azithromycin  500 mg Intravenous Q24H  . DISCONTD: enoxaparin  30 mg Subcutaneous Q24H  . DISCONTD:  ipratropium  0.5 mg Nebulization Q6H  . DISCONTD: metFORMIN  1,000 mg Oral BID WC    Assessment: Principal Problem:  *Chest pain Active Problems:  Shortness of breath  Diabetes mellitus  HTN (hypertension), benign Hyperlipidemia  Plan: *Chest pain  - rule out ACS /MI vs PE vs MSK - first set of cardiac enzymes is negative x2 . 2 d echo pending. CT angio pending to r/o PE. FLP with LDL of 161. - nitroglycerin for pain relief . Will also place on scheduled ibuprofen as chest wall pain reproducible. Follow. Active Problems:  Shortness of breath  - in setting of elevated D DImer concern is for PE, CT chest pending.Influenza PCR pending. - provide nebulizers as needed for shortness of breath  - Will change azithromycin to IV levaquin to cover for possible PNA vs bronchitis. Add mucinex and tamiflu. - saturating well on room air  - DVT prophylaxis  Tachycardia  - perhaps secondary to albuterol  - CT chest pending. Improving.TSH 0.769.cardiac enzymes negative x 2. - EKG with sinus tachy with 1st degree AVB and occ PVC. Follow   Diabetes II - Hgb A1C of 10.2. Will d/c metformin, continue SSI. Diabetes education.  Hyperlipidemia LDL 161. Goal LDL <70 in diabetic patient. Will start low dose zocor.  HTN Follow.   LOS: 1 day   Adnan Vanvoorhis,Pate 07/10/2011, 8:50 AM

## 2011-07-10 NOTE — Progress Notes (Signed)
CBGs today: 232/ 339/ 135 mg/dl  Poorly controlled at home as evidenced by A1C of 10.2% (07/09/11).  Spoke with pt and his daughter Luetta Nutting.  Reviewed A1C results with pt and explained to them the significance of these results.  Reminded pt and dtr that pt's ideal A1C is 7% per ADA standards to reduce both acute and long-term complications.    Encouraged pt to check his CBGs at least twice daily.  Daughter told me she will be glad to help pt check his CBGs more frequently and to record in a blood sugar diary.  Also mentioned to pt and daughter that pt may need insulin to better control his CBGs.  Reminded pt and daughter that ultimately, the physician will determine the best medication regimen for home to control his CBGs.  Pt was reluctant to talk about insulin, however, his daughter (who lives with him), said she would be willing to help him take insulin if MD decides he needs insulin for home.  Recommend insulin pens for pt, if decision made to send pt home on insulin.    Will follow.

## 2011-07-10 NOTE — Progress Notes (Signed)
Echocardiogram 2D Echocardiogram has been performed.  [name] 07/10/2011, 4:47 PM

## 2011-07-10 NOTE — Progress Notes (Signed)
Physical Therapy Evaluation Patient Details Name: [name] MRN: [id number] DOB: 1930/06/09 Today's Date: 07/10/2011  Problem List:  Patient Active Problem List  Diagnoses  . Chest pain  . Shortness of breath  . Diabetes mellitus  . HTN (hypertension), benign    Past Medical History:  Past Medical History  Diagnosis Date  . Diabetes mellitus   . Hypertension   . Obesity    Past Surgical History:  Past Surgical History  Procedure Date  . Abdominal surgery     pt has abdominal scarring and states he had sx but not sure what kind    PT Assessment/Plan/Recommendation PT Assessment Clinical Impression Statement: Patient ambulating fairly well.  Did not get to address balance fully due to pt. on bedrest with bathroom privileges.  Plan to assess balance at next visit when pt. has activity orders.   PT Recommendation/Assessment: Patient will need skilled PT in the acute care venue PT Problem List: Decreased balance PT Therapy Diagnosis : Generalized weakness PT Plan PT Frequency: Min 3X/week PT Treatment/Interventions: Gait training;Stair training;Functional mobility training;Patient/family education PT Recommendation Follow Up Recommendations: Home health PT Equipment Recommended: None recommended by PT PT Goals  Acute Rehab PT Goals PT Goal Formulation: With patient Time For Goal Achievement: 7 days Pt will Ambulate: >150 feet;with modified independence;with least restrictive assistive device PT Goal: Ambulate - Progress: Other (comment) Pt will Go Up / Down Stairs: 3-5 stairs;with modified independence;with least restrictive assistive device PT Goal: Up/Down Stairs - Progress: Other (comment) Additional Goals Additional Goal #1: Patient will score > 50/56 on Berg.    PT Evaluation Precautions/Restrictions    Prior Functioning  Home Living Lives With: Daughter;Son (24 hour care) Receives Help From: Family Type of Home: House Home Layout: One level Home  Access: Stairs to enter Entrance Stairs-Rails: None Entrance Stairs-Number of Steps: 3 Bathroom Shower/Tub: Forensic scientist: Standard Home Adaptive Equipment: Hand-held shower hose Prior Function Level of Independence: Independent with basic ADLs;Independent with homemaking with ambulation;Independent with gait;Independent with transfers Driving: Yes Vocation: Retired Producer, television/film/video: Awake/alert Overall Cognitive Status: Appears within functional limits for tasks assessed Sensation/Coordination Sensation Light Touch: Appears Intact Stereognosis: Not tested Hot/Cold: Not tested Proprioception: Not tested Coordination Gross Motor Movements are Fluid and Coordinated: Yes Fine Motor Movements are Fluid and Coordinated: Yes Extremity Assessment RUE Assessment RUE Assessment: Within Functional Limits LUE Assessment LUE Assessment: Within Functional Limits RLE Assessment RLE Assessment: Within Functional Limits LLE Assessment LLE Assessment: Within Functional Limits Mobility (including Balance) Bed Mobility Bed Mobility: Yes Supine to Sit: 7: Independent Transfers Transfers: Yes Sit to Stand: 7: Independent Stand to Sit: 7: Independent Ambulation/Gait Ambulation/Gait: Yes Ambulation/Gait Assistance: 5: Supervision Ambulation/Gait Assistance Details (indicate cue type and reason): Patient has no difficulty without challenges.  Did not get to address balance fully secondary to pt. on bedrest with bathroom privileges only.   Ambulation Distance (Feet): 70 Feet Assistive device: None Gait velocity: Good cadence Stairs: No Wheelchair Mobility Wheelchair Mobility: No  Posture/Postural Control Posture/Postural Control: No significant limitations Balance Balance Assessed: No Exercise    End of Session PT - End of Session Equipment Utilized During Treatment:  (Refused gait belt) Activity Tolerance: Patient tolerated treatment  well Patient left: in chair;with call bell in reach;with family/visitor present Nurse Communication: Mobility status for transfers;Mobility status for ambulation General Behavior During Session: [facility] for tasks performed Cognition: [facility] for tasks performed  INGOLD,Reshad Saab 07/10/2011, 5:14 PMDawn Ingold,PT Acute Rehabilitation [phone] (pager)

## 2011-07-11 LAB — GLUCOSE, CAPILLARY
Glucose-Capillary: 209 mg/dL — ABNORMAL HIGH (ref 70–99)
Glucose-Capillary: 160 mg/dL — ABNORMAL HIGH (ref 70–99)
Glucose-Capillary: 290 mg/dL — ABNORMAL HIGH (ref 70–99)

## 2011-07-11 LAB — BASIC METABOLIC PANEL
CO2: 26 mEq/L (ref 19–32)
Calcium: 8.8 mg/dL (ref 8.4–10.5)
Creatinine, Ser: 1.15 mg/dL (ref 0.50–1.35)
GFR calc non Af Amer: 59 mL/min — ABNORMAL LOW (ref >90)
Glucose, Bld: 199 mg/dL — ABNORMAL HIGH (ref 70–99)
Sodium: 138 mEq/L (ref 135–145)

## 2011-07-11 LAB — CBC
Hemoglobin: 15 g/dL (ref 13.0–17.0)
MCH: 30.4 pg (ref 26.0–34.0)
MCHC: 33 g/dL (ref 30.0–36.0)
MCV: 92.1 fL (ref 78.0–100.0)
RBC: 4.93 MIL/uL (ref 4.22–5.81)

## 2011-07-11 MED ORDER — INSULIN GLARGINE 100 UNIT/ML ~~LOC~~ SOLN
10.0000 [IU] | SUBCUTANEOUS | Status: DC
  Administered 2011-07-11 – 2011-07-12 (×2): 10 [IU] via SUBCUTANEOUS
  Filled 2011-07-11: qty 3

## 2011-07-11 MED ORDER — INSULIN GLARGINE 100 UNIT/ML ~~LOC~~ SOLN
10.0000 [IU] | Freq: Every day | SUBCUTANEOUS | Status: DC
  Administered 2011-07-11: 10 [IU] via SUBCUTANEOUS
  Filled 2011-07-11: qty 3

## 2011-07-11 MED ORDER — INSULIN PEN STARTER KIT
1.0000 | Freq: Once | Status: AC
  Administered 2011-07-11: 1
  Filled 2011-07-11: qty 1

## 2011-07-11 NOTE — Progress Notes (Signed)
Continued stay review complete.

## 2011-07-11 NOTE — Progress Notes (Signed)
78 YO male admitted for left sided chest pain, pharmacy was consulted to dose lovenox for DVT prophylaxis. Patient renal fx has been stable, estimated crcl ~ 65. Patient is not on anticoagulant prior to admission. CT angio was done yesterday, which is negative for PE. Patient is currently on lovenox 40 mg sq q24hrs for VTE prophylaxis. Dosage is appropriate. Will continue with this dose, and pharmacy will sign off.  Thanks  Bayard Hugger. PharmD, BCPS Clinical pharmacist Pager: [phone]

## 2011-07-11 NOTE — Progress Notes (Signed)
Subjective: Patient states chest pain improved. Pt still with cough.  Objective: Vital signs in last 24 hours: Filed Vitals:   07/10/11 1501 07/10/11 2200 07/11/11 0500 07/11/11 1400  BP:  126/88 123/67 115/88  Pulse: 104 91 95 106  Temp:  97.8 F (36.6 C) 98.9 F (37.2 C) 98.6 F (37 C)  TempSrc:      Resp:  18 20 20   Height:      Weight:   120.4 kg (265 lb 6.9 oz)   SpO2:  95% 100% 96%    Intake/Output Summary (Last 24 hours) at 07/11/11 1851 Last data filed at 07/11/11 0700  Gross per 24 hour  Intake      0 ml  Output    950 ml  Net   -950 ml    Weight change: -3.6 kg (-7 lb 15 oz)  General: Alert, awake, oriented x3, in no acute distress. Heart: Regular rate and rhythm, without murmurs, rubs, gallops. Chest wall tender to palpation improved. Lungs: Clear to auscultation bilaterally. Abdomen: Soft, nontender, nondistended, positive bowel sounds. Extremities: No clubbing cyanosis or edema with positive pedal pulses.   Lab Results:  [facility] 07/11/11 0606 07/10/11 0309 07/09/11 1858  NA 138 133* --  K 4.1 3.5 --  CL 103 98 --  CO2 26 25 --  GLUCOSE 199* 204* --  BUN 11 9 --  CREATININE 1.15 1.13 --  CALCIUM 8.8 8.7 --  MG 1.9 -- 1.7  PHOS -- -- 2.5    Basename 07/09/11 1858  AST 29  ALT 24  ALKPHOS 93  BILITOT 0.3  PROT 7.6  ALBUMIN 3.3*   No results found for this basename: LIPASE:2,AMYLASE:2 in the last 72 hours  Basename 07/11/11 0606 07/10/11 0309 07/09/11 1458  WBC 6.3 8.0 --  NEUTROABS -- -- 7.3  HGB 15.0 15.6 --  HCT 45.4 45.9 --  MCV 92.1 90.9 --  PLT 142* 144* --    Basename 07/10/11 1001 07/10/11 0309 07/09/11 1859  CKTOTAL 2489* 2238* 476*  CKMB 3.5 3.4 2.2  CKMBINDEX -- -- --  TROPONINI <0.30 <0.30 <0.30   No components found with this basename: POCBNP:3  Basename 07/09/11 1707  DDIMER 0.99*    Basename 07/09/11 2026 07/09/11 1858  HGBA1C 9.7* 10.2*    Basename 07/09/11 1859  CHOL 228*  HDL 48  LDLCALC 161*  TRIG  97  CHOLHDL 4.8  LDLDIRECT --    Basename 07/09/11 1858  TSH 0.769  T4TOTAL --  T3FREE --  THYROIDAB --   No results found for this basename: VITAMINB12:2,FOLATE:2,FERRITIN:2,TIBC:2,IRON:2,RETICCTPCT:2 in the last 72 hours  Micro Results: Recent Results (from the past 240 hour(s))  MRSA PCR SCREENING     Status: Normal   Collection Time   07/09/11  9:16 PM      Component Value Range Status Comment   MRSA by PCR NEGATIVE  NEGATIVE  Final   CULTURE, BLOOD (ROUTINE X 2)     Status: Normal (Preliminary result)   Collection Time   07/09/11 10:40 PM      Component Value Range Status Comment   Specimen Description BLOOD LEFT HAND   Final    Special Requests BOTTLES DRAWN AEROBIC AND ANAEROBIC 6CC EA   Final    Setup Time [id number]   Final    Culture     Final    Value:        BLOOD CULTURE RECEIVED NO GROWTH TO DATE CULTURE WILL BE HELD FOR 5 DAYS BEFORE  ISSUING A FINAL NEGATIVE REPORT   Report Status PENDING   Incomplete   CULTURE, BLOOD (ROUTINE X 2)     Status: Normal (Preliminary result)   Collection Time   07/09/11 10:55 PM      Component Value Range Status Comment   Specimen Description BLOOD RIGHT HAND   Final    Special Requests BOTTLES DRAWN AEROBIC AND ANAEROBIC 10CC EA   Final    Setup Time [id number]   Final    Culture     Final    Value:        BLOOD CULTURE RECEIVED NO GROWTH TO DATE CULTURE WILL BE HELD FOR 5 DAYS BEFORE ISSUING A FINAL NEGATIVE REPORT   Report Status PENDING   Incomplete     Studies/Results: Ct Angio Chest W/cm &/or Wo Cm  07/10/2011  *RADIOLOGY REPORT*  Clinical Data: Shortness of breath.  Cough.  Chest pain.  Fever. Elevated D-dimer.  CT ANGIOGRAPHY CHEST  Technique:  Multidetector CT imaging of the chest using the standard protocol during bolus administration of intravenous contrast. Multiplanar reconstructed images including MIPs were obtained and reviewed to evaluate the vascular anatomy.  Comparison: Multiple exams, including 11/02/2010   Findings: No filling defect is identified in the pulmonary arterial tree to suggest pulmonary embolus.  Right eccentric subcarinal node has a short axis diameter of 1.4 cm (formerly 1.2 cm).  Borderline prominent right hilar and infrahilar lymph nodes are present along with borderline prominent right lower paratracheal lymph nodes.  There is also prominence of the left infrahilar nodal tissue, with one node measuring 0.9 cm in short axis on image 67 of series 4.  Diffuse hepatic steatosis is noted along with scattered hypodense lesions in the liver of similar size in distribution to the prior exam.  Abnormal expansion of the right pectoralis major muscle noted with atrophy of muscular component of prominent fatty component suggesting lipomatous infiltration.  No new nodularity is observed compared to prior exams.  Peripheral interstitial accentuation the lungs noted with mild honeycombing in the upper lobes, along with a suggestion of mild underlying paraseptal emphysema.  A 6 mm left upper lobe subpleural nodule along the anterior margin of the major fissure is noted on image 50 series 6, stable from 11/02/2010 but not well seen on the prior exam from 01/30/2010.  IMPRESSION:  1.  Peripheral linear interstitial accentuation of both lungs, with honeycombing best seen in the lung apices.  This is an unusual distribution but in conjunction with the mild but chronic adenopathy raises the possibility of sarcoidosis.  Confirmatory peribronchovascular nodularity is not well seen, although there is some subpleural nodularity, including the 6 mm subpleural nodule along the left major fissure.  This nodule will warrant observation given that it is not well seen on 01/30/2010; I would recommend follow-up chest CT in 6 months. 2.  Normal lipomatous of attrition right pectoralis major muscle, causing fatty expansion of the muscle. 3.  Diffuse hepatic steatosis.  Hypodense lesions scattered in the liver are somewhat indistinct,  but appear consistent with the previously shown cystic lesions. 4. No filling defect is identified in the pulmonary arterial tree to suggest pulmonary embolus.  Original Report Authenticated By: Dellia Cloud, M.D.    Medications:     . aspirin EC  81 mg Oral Daily  . dextromethorphan-guaiFENesin  2 tablet Oral BID  . enoxaparin (LOVENOX) injection  40 mg Subcutaneous Q24H  . Flexpen Starter Kit  1 kit Other Once  . ibuprofen  800 mg Oral TID  . insulin aspart  0-15 Units Subcutaneous TID WC  . insulin aspart  0-5 Units Subcutaneous QHS  . insulin glargine  10 Units Subcutaneous Q0700  . insulin glargine  10 Units Subcutaneous QHS  . levofloxacin (LEVAQUIN) IV  750 mg Intravenous Q24H  . pantoprazole (PROTONIX) IV  40 mg Intravenous Q24H  . simvastatin  20 mg Oral q1800    Assessment: Principal Problem:  *Chest pain Active Problems:  Shortness of breath  Diabetes mellitus  HTN (hypertension), benign Hyperlipidemia  Plan: *Chest pain  - rule out ACS /MI vs PE vs MSK - cardiac enzymes is negative x3 . 2 d echo with nl EF and NWMA. CT angio negative for PE. FLP with LDL of 161. Clinical improvement. - nitroglycerin for pain relief . Continue scheduled ibuprofen and empiric levaquin. Active Problems:  Shortness of breath  - Improved, CT chest neg for PE or infiltrate. Nodule noted which will need f/u CT in ..Influenza PCR negative. - Continue nebulizers, IV levaquin,mucinex.  - saturating well on room air  - DVT prophylaxis  Tachycardia  - perhaps secondary to albuterol  - CT chest negative. Improving.TSH 0.769.cardiac enzymes negative x 3 - EKG with sinus tachy with 1st degree AVB and occ PVC. Follow   Diabetes II - Hgb A1C of 10.2. Will d/c metformin, continue SSI. Diabetes education. Lantus.  Hyperlipidemia LDL 161. Goal LDL <70 in diabetic patient. Will start low dose zocor.  HTN Follow.   LOS: 2 days   [facility] 07/11/2011, 6:51 PM

## 2011-07-11 NOTE — Progress Notes (Signed)
Utilization review completed.  

## 2011-07-11 NOTE — Progress Notes (Signed)
CBGs 01/07: 232/ 239/ 135/ 185 mg/dl CBGs today: 119 mg/dl  Fasting sugar elevated x 2 days.    A1C 10.2% (07/09/11) 1858 pm A1C 9.7% (07/09/11)  2026 pm   Spoke with pt and his daughter at length yesterday.  Looks like pt may benefit from the addition of low dose basal insulin to help improve control both here in hospital and at home.    Recommend Lantus 10 units QHS to start.  MD- will order insulin pen starter kit for pt just in case decision made to send pt home on insulin.  Pt and daughter would prefer insulin pens if pt is to go home on insulin.  If you send pt home on insulin, please also give pt a Rx for insulin pen needles as well.  Will follow.

## 2011-07-12 LAB — GLUCOSE, CAPILLARY
Glucose-Capillary: 181 mg/dL — ABNORMAL HIGH (ref 70–99)
Glucose-Capillary: 249 mg/dL — ABNORMAL HIGH (ref 70–99)

## 2011-07-12 LAB — BASIC METABOLIC PANEL
CO2: 23 mEq/L (ref 19–32)
Chloride: 103 mEq/L (ref 96–112)
Creatinine, Ser: 1.09 mg/dL (ref 0.50–1.35)
GFR calc Af Amer: 73 mL/min — ABNORMAL LOW (ref >90)
Potassium: 3.9 mEq/L (ref 3.5–5.1)
Sodium: 138 mEq/L (ref 135–145)

## 2011-07-12 LAB — CBC
Platelets: 151 10*3/uL (ref 150–400)
RBC: 4.94 MIL/uL (ref 4.22–5.81)
RDW: 14.7 % (ref 11.5–15.5)
WBC: 5.8 10*3/uL (ref 4.0–10.5)

## 2011-07-12 MED ORDER — INSULIN GLARGINE 100 UNIT/ML ~~LOC~~ SOLN: SUBCUTANEOUS | Status: DC

## 2011-07-12 MED ORDER — IBUPROFEN 200 MG PO TABS: 600.0000 mg | ORAL_TABLET | Freq: Three times a day (TID) | ORAL | Status: AC | PRN

## 2011-07-12 MED ORDER — DM-GUAIFENESIN ER 30-600 MG PO TB12: 2.0000 | ORAL_TABLET | Freq: Two times a day (BID) | ORAL | Status: AC

## 2011-07-12 MED ORDER — LEVOFLOXACIN 750 MG PO TABS: 750.0000 mg | ORAL_TABLET | Freq: Every day | ORAL | Status: AC

## 2011-07-12 MED ORDER — OMEPRAZOLE 40 MG PO CPDR: 40.0000 mg | DELAYED_RELEASE_CAPSULE | Freq: Two times a day (BID) | ORAL | Status: DC

## 2011-07-12 MED ORDER — SIMVASTATIN 20 MG PO TABS: 20.0000 mg | ORAL_TABLET | Freq: Every day | ORAL | Status: DC

## 2011-07-12 NOTE — Progress Notes (Signed)
Medications have been tolerated during the course of the shift.  Diabetic Teaching has been implemented, Patient states he verbal understands however he will need more practice with pricking his finger.

## 2011-07-12 NOTE — Progress Notes (Signed)
Noted pt getting ready for d/c today.  RNs have been teaching pt to self administer insulin with insulin pens.  Reviewed take home insulin pen starter kit with pt.  Showed pt how to use outpatient insulin pen needles.  Reminded pt that he will need to go to the pharmacy today or tomorrow and purchase insulin pen needles to use at home (gets 5 free needles in the insulin pen starter kit).  RN will give pt his Lantus pen that he has been using in hospital.  Reminded pt he will need to discard this pen after 30 days.    Reminded pt to throw away used pen needles in a sturdy container (bleach/detergent, etc).  Pt comfortable with insulin injection and eager to go home.   Spoke with RN caring for pt today.  Asked her to please remind MD to write Rx for insulin pens and insulin pen needles. Will follow.

## 2011-07-12 NOTE — Discharge Summary (Addendum)
Name: [name] MRN: [id number] DOB: 1930-02-06 78 y.o.  Date of Admission: 07/09/2011 12:38 PM Date of Discharge: 07/12/2011 Attending Physician: Ramiro Harvest  Discharge Diagnosis: Principal Problem:  *Chest pain Active Problems:  Acute bronchitis Uncontrolled Diabetes mellitus  Elevated blood pressure Deconditioning.  Discharge Medications: Current Discharge Medication List    START taking these medications   Details  dextromethorphan-guaiFENesin (MUCINEX DM) 30-600 MG per 12 hr tablet Take 2 tablets by mouth 2 (two) times daily. Qty: 30 tablet, Refills: 0    insulin glargine (LANTUS) 100 UNIT/ML injection 10 units subcutaneous Qam and QHS Qty: 3 mL, Refills: 0    levofloxacin (LEVAQUIN) 750 MG tablet Take 1 tablet (750 mg total) by mouth daily. Qty: 4 tablet, Refills: 0    omeprazole (PRILOSEC) 40 MG capsule Take 1 capsule (40 mg total) by mouth 2 (two) times daily. Qty: 60 capsule, Refills: 0    simvastatin (ZOCOR) 20 MG tablet Take 1 tablet (20 mg total) by mouth daily at 6 PM. Qty: 30 tablet, Refills: 0      CONTINUE these medications which have NOT CHANGED   Details  aspirin 81 MG tablet Take 81 mg by mouth daily.      oxyCODONE-acetaminophen (PERCOCET) 5-325 MG per tablet Take 1 tablet by mouth every 4 (four) hours as needed. pain       STOP taking these medications     metFORMIN (GLUCOPHAGE) 1000 MG tablet         Disposition and follow-up:   [name] was discharged from [facility] in improved/stable condition.    Follow-up Appointments: Discharge Orders    Future Orders Please Complete By Expires   Diet Carb Modified      Increase activity slowly         Consultations:    Procedures Performed:  Dg Chest 2 View  07/09/2011  *RADIOLOGY REPORT*  Clinical Data: Cough, congestion, fever  CHEST - 2 VIEW  Comparison: CT chest dated 11/02/2010  Findings: Chronic interstitial markings.  No frank interstitial edema.  No pleural effusion or pneumothorax.  Stable cardiomegaly.  Mild degenerative changes of the visualized thoracolumbar spine.  IMPRESSION: No evidence of acute cardiopulmonary disease.  Chronic interstitial markings, stable from prior studies.  Stable cardiomegaly.  Original Report Authenticated By: Charline Bills, M.D.   Ct Angio Chest W/cm &/or Wo Cm  07/10/2011  *RADIOLOGY REPORT*  Clinical Data: Shortness of breath.  Cough.  Chest pain.  Fever. Elevated D-dimer.  CT ANGIOGRAPHY CHEST  Technique:  Multidetector CT imaging of the chest using the standard protocol during bolus administration of intravenous contrast. Multiplanar reconstructed images including MIPs were obtained and reviewed to evaluate the vascular anatomy.  Comparison: Multiple exams, including 11/02/2010  Findings: No filling defect is identified in the pulmonary arterial tree to suggest pulmonary embolus.  Right eccentric subcarinal node has a short axis diameter of 1.4 cm (formerly 1.2 cm).  Borderline prominent right hilar and infrahilar lymph nodes are present along with borderline prominent right lower paratracheal lymph nodes.  There is also prominence of the left infrahilar nodal tissue, with one node measuring 0.9 cm in short axis on image 67 of series 4.  Diffuse hepatic steatosis is noted along with scattered hypodense lesions in the liver of similar size in distribution to the prior exam.  Abnormal expansion of the right pectoralis major muscle noted with atrophy of muscular component of prominent fatty component suggesting lipomatous infiltration.  No new nodularity is observed compared  to prior exams.  Peripheral interstitial accentuation the lungs noted with mild honeycombing in the upper lobes, along with a suggestion of mild underlying paraseptal emphysema.  A 6 mm left upper lobe subpleural nodule along the anterior margin of the major fissure is noted on image 50 series 6, stable from 11/02/2010 but not well seen on the prior  exam from 01/30/2010.  IMPRESSION:  1.  Peripheral linear interstitial accentuation of both lungs, with honeycombing best seen in the lung apices.  This is an unusual distribution but in conjunction with the mild but chronic adenopathy raises the possibility of sarcoidosis.  Confirmatory peribronchovascular nodularity is not well seen, although there is some subpleural nodularity, including the 6 mm subpleural nodule along the left major fissure.  This nodule will warrant observation given that it is not well seen on 01/30/2010; I would recommend follow-up chest CT in 6 months. 2.  Normal lipomatous of attrition right pectoralis major muscle, causing fatty expansion of the muscle. 3.  Diffuse hepatic steatosis.  Hypodense lesions scattered in the liver are somewhat indistinct, but appear consistent with the previously shown cystic lesions. 4. No filling defect is identified in the pulmonary arterial tree to suggest pulmonary embolus.  Original Report Authenticated By: Dellia Cloud, M.D.   2-D echocardiogram Study Conclusions  Left ventricle: The cavity size was normal. Wall thickness was increased in a pattern of mild LVH. There was mild focal basal hypertrophy of the septum. Systolic function was normal. The estimated ejection fraction was in the range of 55% to 60%. Wall motion was normal; there were no regional wall motion abnormalities. Features are consistent with a pseudonormal left ventricular filling pattern, with concomitant abnormal relaxation and increased filling pressure (grade 2 diastolic dysfunction). Transthoracic echocardiography. M-mode, complete 2D, spectral Doppler, and color Doppler. Height: Height: 177.8cm. Height: 70in. Weight: Weight: 123.8kg. Weight: 272.4lb. Body mass index: BMI: 39.2kg/m^2. Body surface area: BSA: 2.54m^2. Blood pressure: 95/58. Patient status: Inpatient. Location:  Bedside.  ------------------------------------------------------------  ------------------------------------------------------------ Left ventricle: The cavity size was normal. Wall thickness was increased in a pattern of mild LVH. There was mild focal basal hypertrophy of the septum. Systolic function was normal. The estimated ejection fraction was in the range of 55% to 60%. Wall motion was normal; there were no regional wall motion abnormalities. Features are consistent with a pseudonormal left ventricular filling pattern, with concomitant abnormal relaxation and increased filling pressure (grade 2 diastolic dysfunction).     Brief history HPI:  78 year old male with history of diabetes presented to ED with complaints of left sided chest pain, sharp, 9/10 in intensity, associated with movement, non radiating. No associated palpitations and no nausea or vomiting, no left arm numbness or tingling. No complaints of abdominal pain, blood in stool or urine. Patient did report being short of breath over 1 week prior to admission which is when chest pain started. He also reported non productive cough and no fever or chills, no lightheadedness or dizziness or loss of consciousness. No sick contacts at home.    Physical EXAM General: Alert, awake, oriented x3, in no acute distress.  Heart: Regular rate and rhythm, without murmurs, rubs, gallops. Decreased Chest wall tenderness to palpation.  Lungs: Clear to auscultation bilaterally.  Abdomen: Soft, nontender, nondistended, positive bowel sounds.  Extremities: No clubbing cyanosis or edema with positive pedal pulses.     Hospital Course by problem list: Principal Problem:  *Chest pain Active Problems:  Shortness of breath  Diabetes mellitus Elevated blood pressure  Chest pain -Upon admission the patient had a cardiac enzymes cycled and came back negative for MI. He had a CT of his chest which was negative for PE. The rounding M.D.  noted that the patient had a chest wall tenderness to our palpation, and also his history and clinical findings are consistent with acute bronchitis. She was started on NSAIDs, for the musculoskeletal component and  on mucolytics and antibiotics for the bronchitis. With this interventions his symptoms are resolved and he is asymptomatic at this time. He had a 2-D echocardiogram done and the results as stated above with a diastolic dysfunction noted but no wall motion abnormalities reported. The impression was that his chest pain was secondary to musculoskeletal etiology with the bronchitis likely contributing. He is being discharged at this time for outpatient followup Acute bronchitis - As above workup included CT chest neg for PE or infiltrate. Nodule noted which will need f/u CT in ..Influenza PCR negative. he is to follow up with his primary care physician at [facility] and have a followup CT in 6 months for the nodule. He was treated with mucolytics, bronchodilators and antibiotics during this hospital stay and he is clinically much improved and will be discharged to complete his antibiotic course outpt. - saturating well on room air    Tachycardia  - perhaps secondary to albuterol, resolved and wok up- CT chest negative.TSH 0.769.cardiac enzymes negative x 3,   EKG with sinus tachy with 1st degree AVB and occ PVC. Diabetes II, uncontrolled  - Hgb A1C of 10.2.  He was started on Lantus during this hospital stay and his blood sugars were better controlled. His metformin was discontinued. He received education on administering his lantus, as well as a prescription for his supplies. He is to follow up outpatient with his only care physician. Hyperlipidemia  LDL 161. Goal LDL <70 in diabetic patient. Will start low dose zocor.  Elevated blood pressure -noted initially but improved on no meds on follow up rechecks in hospital.He is to follow up outpatient with his primary care physician for  continued monitoring of his blood pressures and to start her antihypertensives when clinically appropriate. Deconditioning  -PT OT was consulted and they saw pt in the hospital  Discharge Vitals:  BP 137/77  Pulse 91  Temp(Src) 99 F (37.2 C) (Oral)  Resp 21  Ht 5\' 10"  (1.778 m)  Wt 120.2 kg (264 lb 15.9 oz)  BMI 38.02 kg/m2  SpO2 96%  Discharge Labs:  Results for orders placed during the hospital encounter of 07/09/11 (from the past 24 hour(s))  GLUCOSE, CAPILLARY     Status: Abnormal   Collection Time   07/11/11 12:35 PM      Component Value Range   Glucose-Capillary 209 (*) 70 - 99 (mg/dL)   Comment 1 Notify RN    GLUCOSE, CAPILLARY     Status: Abnormal   Collection Time   07/11/11  4:23 PM      Component Value Range   Glucose-Capillary 160 (*) 70 - 99 (mg/dL)   Comment 1 Notify RN    GLUCOSE, CAPILLARY     Status: Abnormal   Collection Time   07/11/11  9:08 PM      Component Value Range   Glucose-Capillary 290 (*) 70 - 99 (mg/dL)  CBC     Status: Normal   Collection Time   07/12/11  6:00 AM      Component Value Range   WBC 5.8  4.0 - 10.5 (K/uL)  RBC 4.94  4.22 - 5.81 (MIL/uL)   Hemoglobin 15.2  13.0 - 17.0 (g/dL)   HCT 40.9  81.1 - 91.4 (%)   MCV 91.5  78.0 - 100.0 (fL)   MCH 30.8  26.0 - 34.0 (pg)   MCHC 33.6  30.0 - 36.0 (g/dL)   RDW 78.2  95.6 - 21.3 (%)   Platelets 151  150 - 400 (K/uL)  BASIC METABOLIC PANEL     Status: Abnormal   Collection Time   07/12/11  6:00 AM      Component Value Range   Sodium 138  135 - 145 (mEq/L)   Potassium 3.9  3.5 - 5.1 (mEq/L)   Chloride 103  96 - 112 (mEq/L)   CO2 23  19 - 32 (mEq/L)   Glucose, Bld 175 (*) 70 - 99 (mg/dL)   BUN 10  6 - 23 (mg/dL)   Creatinine, Ser 0.86  0.50 - 1.35 (mg/dL)   Calcium 9.1  8.4 - 57.8 (mg/dL)   GFR calc non Af Amer 63 (*) >90 (mL/min)   GFR calc Af Amer 73 (*) >90 (mL/min)  GLUCOSE, CAPILLARY     Status: Abnormal   Collection Time   07/12/11  6:11 AM      Component Value Range    Glucose-Capillary 181 (*) 70 - 99 (mg/dL)  GLUCOSE, CAPILLARY     Status: Abnormal   Collection Time   07/12/11  7:58 AM      Component Value Range   Glucose-Capillary 172 (*) 70 - 99 (mg/dL)  GLUCOSE, CAPILLARY     Status: Abnormal   Collection Time   07/12/11 11:46 AM      Component Value Range   Glucose-Capillary 249 (*) 70 - 99 (mg/dL)    Signed: Kela Millin 07/12/2011, 12:23 PM

## 2011-07-12 NOTE — Progress Notes (Signed)
DC orders received.  Pt stable with no S/S distress. Reviewed DC instructions and medications with patient and son.  Patient DC home. Nolon Nations

## 2011-07-12 NOTE — Discharge Summary (Signed)
Name: [name] MRN: [id number] DOB: 02-Feb-1930 78 y.o.  Date of Admission: 07/09/2011 12:38 PM Date of Discharge: 07/12/2011 Attending Physician: Ramiro Harvest  Discharge Diagnosis: Principal Problem:  *Chest pain Active Problems:  Shortness of breath  Diabetes mellitus  HTN (hypertension), benign   Discharge Medications: Current Discharge Medication List    START taking these medications   Details  dextromethorphan-guaiFENesin (MUCINEX DM) 30-600 MG per 12 hr tablet Take 2 tablets by mouth 2 (two) times daily. Qty: 30 tablet, Refills: 0    levofloxacin (LEVAQUIN) 750 MG tablet Take 1 tablet (750 mg total) by mouth daily. Qty: 4 tablet, Refills: 0    omeprazole (PRILOSEC) 40 MG capsule Take 1 capsule (40 mg total) by mouth 2 (two) times daily. Qty: 60 capsule, Refills: 0    simvastatin (ZOCOR) 20 MG tablet Take 1 tablet (20 mg total) by mouth daily at 6 PM. Qty: 30 tablet, Refills: 0      CONTINUE these medications which have NOT CHANGED   Details  aspirin 81 MG tablet Take 81 mg by mouth daily.      metFORMIN (GLUCOPHAGE) 1000 MG tablet Take 1,000 mg by mouth 2 (two) times daily with a meal.      oxyCODONE-acetaminophen (PERCOCET) 5-325 MG per tablet Take 1 tablet by mouth every 4 (four) hours as needed. pain        LANTUS 10U SQ BID- PT INSTRUCTED ON ADMINISTRATION AND PRESCRIPTION FOR SUPPLIES PROVIDED ON DISCHARGE  METFORMIN DC'ED AND PT INFORMED OF THIS CHANGE- REFLECTED ON HIS AVS Disposition and follow-up:   [name] was discharged from [facility] in improved/stable condition.    Follow-up Appointments: Discharge Orders    Future Orders Please Complete By Expires   Diet Carb Modified      Increase activity slowly         Consultations:    Procedures Performed:  Dg Chest 2 View  07/09/2011  *RADIOLOGY REPORT*  Clinical Data: Cough, congestion, fever  CHEST - 2 VIEW  Comparison: CT chest dated 11/02/2010   Findings: Chronic interstitial markings.  No frank interstitial edema. No pleural effusion or pneumothorax.  Stable cardiomegaly.  Mild degenerative changes of the visualized thoracolumbar spine.  IMPRESSION: No evidence of acute cardiopulmonary disease.  Chronic interstitial markings, stable from prior studies.  Stable cardiomegaly.  Original Report Authenticated By: Charline Bills, M.D.   Ct Angio Chest W/cm &/or Wo Cm  07/10/2011  *RADIOLOGY REPORT*  Clinical Data: Shortness of breath.  Cough.  Chest pain.  Fever. Elevated D-dimer.  CT ANGIOGRAPHY CHEST  Technique:  Multidetector CT imaging of the chest using the standard protocol during bolus administration of intravenous contrast. Multiplanar reconstructed images including MIPs were obtained and reviewed to evaluate the vascular anatomy.  Comparison: Multiple exams, including 11/02/2010  Findings: No filling defect is identified in the pulmonary arterial tree to suggest pulmonary embolus.  Right eccentric subcarinal node has a short axis diameter of 1.4 cm (formerly 1.2 cm).  Borderline prominent right hilar and infrahilar lymph nodes are present along with borderline prominent right lower paratracheal lymph nodes.  There is also prominence of the left infrahilar nodal tissue, with one node measuring 0.9 cm in short axis on image 67 of series 4.  Diffuse hepatic steatosis is noted along with scattered hypodense lesions in the liver of similar size in distribution to the prior exam.  Abnormal expansion of the right pectoralis major muscle noted with atrophy of muscular component of prominent  fatty component suggesting lipomatous infiltration.  No new nodularity is observed compared to prior exams.  Peripheral interstitial accentuation the lungs noted with mild honeycombing in the upper lobes, along with a suggestion of mild underlying paraseptal emphysema.  A 6 mm left upper lobe subpleural nodule along the anterior margin of the major fissure is noted on  image 50 series 6, stable from 11/02/2010 but not well seen on the prior exam from 01/30/2010.  IMPRESSION:  1.  Peripheral linear interstitial accentuation of both lungs, with honeycombing best seen in the lung apices.  This is an unusual distribution but in conjunction with the mild but chronic adenopathy raises the possibility of sarcoidosis.  Confirmatory peribronchovascular nodularity is not well seen, although there is some subpleural nodularity, including the 6 mm subpleural nodule along the left major fissure.  This nodule will warrant observation given that it is not well seen on 01/30/2010; I would recommend follow-up chest CT in 6 months. 2.  Normal lipomatous of attrition right pectoralis major muscle, causing fatty expansion of the muscle. 3.  Diffuse hepatic steatosis.  Hypodense lesions scattered in the liver are somewhat indistinct, but appear consistent with the previously shown cystic lesions. 4. No filling defect is identified in the pulmonary arterial tree to suggest pulmonary embolus.  Original Report Authenticated By: Dellia Cloud, M.D.   Brief History  Pt is a 78 year old male with history of diabetes presented to ED with complaints of left sided chest pain, sharp, 9/10 in intensity, associated with movement, non radiating. No associated palpitations and no nausea or vomiting, no left arm numbness or tingling. No complaints of abdominal pain, blood in stool or urine. Patient does report being short of breath over 1 week prior to admission which is when chest pain started. He also reports non productive cough and no fever or chills, no lightheadedness or dizziness or loss of consciousness. No sick contacts at home.  Physical Exam General: Alert, awake, oriented x3, in no acute distress.  Heart: Regular rate and rhythm, without murmurs, rubs, gallops. Chest wall tender to palpation improved.  Lungs: Clear to auscultation bilaterally.  Abdomen: Soft, nontender, nondistended,  positive bowel sounds.  Extremities: No clubbing cyanosis or edema with positive pedal pulses.      Hospital Course by problem list: Principal Problem:  *Chest pain Active Problems:  Shortness of breath  Diabetes mellitus  HTN (hypertension), benign   *Chest pain  Upon admission cardiac enzymes were cycled and came back negative. A  2 d echo showed nl EF and NWMA. CT angio negative for PE. FLP with LDL of 161. Pt was found to have chest wall tenderness and was treated with NSAIDs. He was also treated for acute bronchitis and with these interventions his chest pain resolved. He was discharged on ibuprofen and empiric levaquin.  The impression was that his chest pain was secondary to musculoskeletal etiology with bronchitis possibly contributing as well. Acute bronchitis/SOB  - Pt had a  CT chest neg for PE or infiltrate. Nodule noted which will need f/u CT in . Also Influenza PCR negative.  He was treated with nebulizers, abx,mucinex, and his symptoms resolved. He wassaturating well on room air prior to d/c. Tachycardia  - perhaps secondary to albuterol, resolved  - CT chest negative. TSH 0.769.cardiac enzymes negative x 3  - EKG with sinus tachy with 1st degree AVB and occ PVC.  Diabetes II  - Hgb A1C of 10.2.  Metformin was dc'ed, and he ws placed  on Lantus with SSI. He received Diabetes education. He was dc'ed on  Lantus, and is to f/u with PCP for further monitoring and adjustment of his meds as appropriate for optimal BG control.  Hyperlipidemia  Pt had FLP done -LDL 161. Goal LDL <70 in diabetic patient. He was started  zocor which he is to continue on d/c.      Discharge Vitals:  BP 137/77  Pulse 91  Temp(Src) 99 F (37.2 C) (Oral)  Resp 21  Ht 5\' 10"  (1.778 m)  Wt 120.2 kg (264 lb 15.9 oz)  BMI 38.02 kg/m2  SpO2 96%  Discharge Labs:  Results for orders placed during the hospital encounter of 07/09/11 (from the past 24 hour(s))  GLUCOSE, CAPILLARY     Status:  Abnormal   Collection Time   07/11/11 12:35 PM      Component Value Range   Glucose-Capillary 209 (*) 70 - 99 (mg/dL)   Comment 1 Notify RN    GLUCOSE, CAPILLARY     Status: Abnormal   Collection Time   07/11/11  4:23 PM      Component Value Range   Glucose-Capillary 160 (*) 70 - 99 (mg/dL)   Comment 1 Notify RN    GLUCOSE, CAPILLARY     Status: Abnormal   Collection Time   07/11/11  9:08 PM      Component Value Range   Glucose-Capillary 290 (*) 70 - 99 (mg/dL)  CBC     Status: Normal   Collection Time   07/12/11  6:00 AM      Component Value Range   WBC 5.8  4.0 - 10.5 (K/uL)   RBC 4.94  4.22 - 5.81 (MIL/uL)   Hemoglobin 15.2  13.0 - 17.0 (g/dL)   HCT 81.1  91.4 - 78.2 (%)   MCV 91.5  78.0 - 100.0 (fL)   MCH 30.8  26.0 - 34.0 (pg)   MCHC 33.6  30.0 - 36.0 (g/dL)   RDW 95.6  21.3 - 08.6 (%)   Platelets 151  150 - 400 (K/uL)  BASIC METABOLIC PANEL     Status: Abnormal   Collection Time   07/12/11  6:00 AM      Component Value Range   Sodium 138  135 - 145 (mEq/L)   Potassium 3.9  3.5 - 5.1 (mEq/L)   Chloride 103  96 - 112 (mEq/L)   CO2 23  19 - 32 (mEq/L)   Glucose, Bld 175 (*) 70 - 99 (mg/dL)   BUN 10  6 - 23 (mg/dL)   Creatinine, Ser 5.78  0.50 - 1.35 (mg/dL)   Calcium 9.1  8.4 - 46.9 (mg/dL)   GFR calc non Af Amer 63 (*) >90 (mL/min)   GFR calc Af Amer 73 (*) >90 (mL/min)  GLUCOSE, CAPILLARY     Status: Abnormal   Collection Time   07/12/11  6:11 AM      Component Value Range   Glucose-Capillary 181 (*) 70 - 99 (mg/dL)  GLUCOSE, CAPILLARY     Status: Abnormal   Collection Time   07/12/11  7:58 AM      Component Value Range   Glucose-Capillary 172 (*) 70 - 99 (mg/dL)  GLUCOSE, CAPILLARY     Status: Abnormal   Collection Time   07/12/11 11:46 AM      Component Value Range   Glucose-Capillary 249 (*) 70 - 99 (mg/dL)    Signed: Kela Millin 07/12/2011, 12:03 PM

## 2011-07-12 NOTE — Progress Notes (Signed)
Shift report received from [facility]. Patient resting in bed. He is alert and oriented X4. Patient denies complications at this time. No signs or respiratory or cardiac distress.  Patient skin is intact and IV access to RT Hand is intact.  Patient is free from injury,call bell in reach and bed brakes are engaged.  Medications have been tolerated during the course of the shift.  No complications noted.  Patient is a pleasant man and he has willingness to learn his diabetes teaching.  Teaching has been initiated. No further pertinent findings at this time. Will continue to monitor patient during the course of the shift.

## 2011-07-12 NOTE — Progress Notes (Signed)
Physical Therapy Treatment Patient Details Name: [name] MRN: [id number] DOB: 1930-04-25 Today's Date: 07/12/2011  PT Assessment/Plan  PT - Assessment/Plan Comments on Treatment Session: Patient is progressing with mobility.  Sharlene Motts did show that patient is a moderate fall risk without the device therefore recommended that pt. use a cane or RW (he has both) for balance initially upon d/c home.  Also recommend Outpt. PT f/u.  Patient/son agree.  MD:  Pt. will need prescription for Outpt. PT.  PT Plan: Discharge plan needs to be updated;Frequency remains appropriate PT Frequency: Min 3X/week Follow Up Recommendations: Outpatient PT;Supervision/Assistance - 24 hour Equipment Recommended: None recommended by PT PT Goals  Acute Rehab PT Goals PT Goal Formulation: With patient PT Goal: Ambulate - Progress: Progressing toward goal PT Goal: Up/Down Stairs - Progress: Other (comment) Additional Goals PT Goal: Additional Goal #1 - Progress: Progressing toward goal  PT Treatment Precautions/Restrictions  Precautions Precautions: Fall Mobility (including Balance) Bed Mobility Supine to Sit: 7: Independent Transfers Sit to Stand: 7: Independent Stand to Sit: 7: Independent Ambulation/Gait Ambulation/Gait Assistance: 5: Supervision Ambulation/Gait Assistance Details (indicate cue type and reason): Patient has difficulty with one legged stance and high level balance activities.  See Sharlene Motts assessment. Ambulation Distance (Feet): 15 Feet Assistive device: None Gait Pattern: Within Functional Limits Gait velocity: Normal cadence Stairs: No Wheelchair Mobility Wheelchair Mobility: No  Posture/Postural Control Posture/Postural Control: No significant limitations Balance Balance Assessed: Yes Berg Balance Test Sit to Stand: Able to stand without using hands and stabilize independently Standing Unsupported: Able to stand safely 2 minutes Sitting with Back Unsupported but Feet Supported  on Floor or Stool: Able to sit safely and securely 2 minutes Stand to Sit: Sits safely with minimal use of hands Transfers: Able to transfer safely, minor use of hands Standing Unsupported with Eyes Closed: Able to stand 10 seconds safely Standing Ubsupported with Feet Together: Able to place feet together independently and stand for 1 minute with supervision From Standing, Reach Forward with Outstretched Arm: Can reach confidently >25 cm (10") From Standing Position, Pick up Object from Floor: Able to pick up shoe safely and easily From Standing Position, Turn to Look Behind Over each Shoulder: Looks behind from both sides and weight shifts well Turn 360 Degrees: Able to turn 360 degrees safely in 4 seconds or less Standing Unsupported, Alternately Place Feet on Step/Stool: Able to complete >2 steps/needs minimal assist Standing Unsupported, One Foot in Front: Able to take small step independently and hold 30 seconds Standing on One Leg: Tries to lift leg/unable to hold 3 seconds but remains standing independently Total Score: 47  High Level Balance High Level Balance Comments: 47/56 on Berg assessment, suggestive of moderate risk of falls.  Educated to pt. that he will be safer using assistive device  Exercise  General Exercises - Lower Extremity Short Arc Quad: AROM;Both;5 reps;Supine Hip ABduction/ADduction: AROM;Strengthening;Both;5 reps;Standing Straight Leg Raises: AROM;Both;5 reps;Supine Hip Flexion/Marching: AROM;Strengthening;Both;5 reps;Standing Mini-Sqauts: AROM;Both;Standing;5 reps Other Exercises Other Exercises: Hip extension standing AROM 5 reps bil. End of Session PT - End of Session Equipment Utilized During Treatment: Gait belt Activity Tolerance: Patient limited by fatigue Patient left: in chair;with call bell in reach;with family/visitor present Nurse Communication: Mobility status for transfers;Mobility status for ambulation General Behavior During Session: [facility]  for tasks performed Cognition: [facility] for tasks performed  INGOLD,Diamond Jentz 07/12/2011, 11:03 AM  [facility] [phone] (pager)

## 2011-07-12 NOTE — Plan of Care (Signed)
Problem: Phase [name] Progression Outcomes Goal: No anginal pain Outcome: Completed/Met Date Met:  07/12/11 Patient denies chest pain during the course of the shift Goal: Tolerating diet Outcome: Completed/Met Date Met:  07/12/11 Patient tolerated diet during the course of the shift Goal: If positive for MI, change to MI Path Outcome: Progressing Patient remains free from injury during the course of the shift, room intact and clutter free

## 2011-07-12 NOTE — Progress Notes (Signed)
   CARE MANAGEMENT NOTE 07/12/2011  Patient:  [name], [name]   Account Number:  [id number]  Date Initiated:  07/12/2011  Documentation initiated by:  Letha Cape  Subjective/Objective Assessment:   dx chest pain  admit as observation - lives with children.     Action/Plan:   pt eval- rec outpt pt   Anticipated DC Date:  07/12/2011   Anticipated DC Plan:  HOME/SELF CARE      DC Planning Services  CM consult      Choice offered to / List presented to:             Status of service:  Completed, signed off Medicare Important Message given?   (If response is "NO", the following Medicare IM given date fields will be blank) Date Medicare IM given:   Date Additional Medicare IM given:    Discharge Disposition:  HOME/SELF CARE  Per UR Regulation:    Comments:  PCP Jeri Cos  07/11/12 11:31 Letha Cape RN, BSN [phone] Patient for dc today, physical therapy has rec out pt pt. Will fax referral over with demographic sheet.

## 2011-07-12 NOTE — Progress Notes (Signed)
RN called patient about Motrin Rx left at hospital.  Pt verbalized understanding of how and when to take medication and that it is an OTC drug [name]

## 2011-12-31 ENCOUNTER — Observation Stay (HOSPITAL_COMMUNITY)
Admission: EM | Admit: 2011-12-31 | Discharge: 2012-01-01 | Disposition: A | Discharge status: Home / Self Care | Payer: Medicare Other | Attending: Internal Medicine | Admitting: Internal Medicine

## 2011-12-31 ENCOUNTER — Emergency Department (HOSPITAL_COMMUNITY): Payer: Medicare Other

## 2011-12-31 ENCOUNTER — Encounter (HOSPITAL_COMMUNITY): Payer: Self-pay | Admitting: Emergency Medicine

## 2011-12-31 DIAGNOSIS — R42 Dizziness and giddiness: Secondary | ICD-10-CM | POA: Diagnosis present

## 2011-12-31 DIAGNOSIS — I129 Hypertensive chronic kidney disease with stage 1 through stage 4 chronic kidney disease, or unspecified chronic kidney disease: Secondary | ICD-10-CM | POA: Insufficient documentation

## 2011-12-31 DIAGNOSIS — R079 Chest pain, unspecified: Secondary | ICD-10-CM

## 2011-12-31 DIAGNOSIS — E860 Dehydration: Secondary | ICD-10-CM | POA: Diagnosis present

## 2011-12-31 DIAGNOSIS — R911 Solitary pulmonary nodule: Secondary | ICD-10-CM

## 2011-12-31 DIAGNOSIS — E1165 Type 2 diabetes mellitus with hyperglycemia: Secondary | ICD-10-CM

## 2011-12-31 DIAGNOSIS — R739 Hyperglycemia, unspecified: Secondary | ICD-10-CM

## 2011-12-31 DIAGNOSIS — D751 Secondary polycythemia: Secondary | ICD-10-CM | POA: Diagnosis present

## 2011-12-31 DIAGNOSIS — I10 Essential (primary) hypertension: Secondary | ICD-10-CM

## 2011-12-31 DIAGNOSIS — R0602 Shortness of breath: Secondary | ICD-10-CM | POA: Insufficient documentation

## 2011-12-31 DIAGNOSIS — E785 Hyperlipidemia, unspecified: Secondary | ICD-10-CM | POA: Diagnosis present

## 2011-12-31 DIAGNOSIS — Z6838 Body mass index (BMI) 38.0-38.9, adult: Secondary | ICD-10-CM | POA: Insufficient documentation

## 2011-12-31 DIAGNOSIS — IMO0002 Reserved for concepts with insufficient information to code with codable children: Secondary | ICD-10-CM | POA: Insufficient documentation

## 2011-12-31 DIAGNOSIS — E118 Type 2 diabetes mellitus with unspecified complications: Secondary | ICD-10-CM

## 2011-12-31 DIAGNOSIS — E119 Type 2 diabetes mellitus without complications: Secondary | ICD-10-CM

## 2011-12-31 DIAGNOSIS — E669 Obesity, unspecified: Secondary | ICD-10-CM | POA: Diagnosis present

## 2011-12-31 DIAGNOSIS — N182 Chronic kidney disease, stage 2 (mild): Secondary | ICD-10-CM | POA: Diagnosis present

## 2011-12-31 DIAGNOSIS — D45 Polycythemia vera: Secondary | ICD-10-CM | POA: Insufficient documentation

## 2011-12-31 DIAGNOSIS — E871 Hypo-osmolality and hyponatremia: Secondary | ICD-10-CM

## 2011-12-31 HISTORY — DX: Unspecified osteoarthritis, unspecified site: M19.90

## 2011-12-31 LAB — POCT I-STAT TROPONIN I: Troponin i, poc: 0 ng/mL (ref 0.00–0.08)

## 2011-12-31 LAB — CBC WITH DIFFERENTIAL/PLATELET
Basophils Absolute: 0 10*3/uL (ref 0.0–0.1)
Eosinophils Absolute: 0.3 10*3/uL (ref 0.0–0.7)
Eosinophils Relative: 5 % (ref 0–5)
HCT: 49.8 % (ref 39.0–52.0)
Lymphocytes Relative: 43 % (ref 12–46)
MCH: 31.1 pg (ref 26.0–34.0)
MCHC: 34.7 g/dL (ref 30.0–36.0)
MCV: 89.6 fL (ref 78.0–100.0)
Monocytes Absolute: 0.4 10*3/uL (ref 0.1–1.0)
Platelets: 149 10*3/uL — ABNORMAL LOW (ref 150–400)
RDW: 14.6 % (ref 11.5–15.5)

## 2011-12-31 LAB — BASIC METABOLIC PANEL
CO2: 22 mEq/L (ref 19–32)
Calcium: 9.5 mg/dL (ref 8.4–10.5)
Creatinine, Ser: 1.1 mg/dL (ref 0.50–1.35)
GFR calc non Af Amer: 62 mL/min — ABNORMAL LOW (ref >90)
Glucose, Bld: 366 mg/dL — ABNORMAL HIGH (ref 70–99)
Sodium: 133 mEq/L — ABNORMAL LOW (ref 135–145)

## 2011-12-31 LAB — GLUCOSE, CAPILLARY
Glucose-Capillary: 363 mg/dL — ABNORMAL HIGH (ref 70–99)
Glucose-Capillary: 244 mg/dL — ABNORMAL HIGH (ref 70–99)

## 2011-12-31 LAB — CARDIAC PANEL(CRET KIN+CKTOT+MB+TROPI)
CK, MB: 2.2 ng/mL (ref 0.3–4.0)
Relative Index: INVALID (ref 0.0–2.5)
Relative Index: INVALID (ref 0.0–2.5)
Total CK: 75 U/L (ref 7–232)
Troponin I: <0.3 ng/mL (ref <0.30)

## 2011-12-31 MED ORDER — INSULIN ASPART 100 UNIT/ML ~~LOC~~ SOLN
6.0000 [IU] | Freq: Once | SUBCUTANEOUS | Status: AC
  Administered 2011-12-31: 6 [IU] via INTRAVENOUS
  Filled 2011-12-31: qty 1

## 2011-12-31 MED ORDER — METOPROLOL TARTRATE 12.5 MG HALF TABLET
12.5000 mg | ORAL_TABLET | Freq: Two times a day (BID) | ORAL | Status: DC
  Administered 2011-12-31 – 2012-01-01 (×2): 12.5 mg via ORAL
  Filled 2011-12-31 (×3): qty 1

## 2011-12-31 MED ORDER — ASPIRIN EC 325 MG PO TBEC
325.0000 mg | DELAYED_RELEASE_TABLET | Freq: Every day | ORAL | Status: DC
  Administered 2011-12-31 – 2012-01-01 (×2): 325 mg via ORAL
  Filled 2011-12-31 (×3): qty 1

## 2011-12-31 MED ORDER — ACETAMINOPHEN 325 MG PO TABS: 650.0000 mg | ORAL_TABLET | Freq: Four times a day (QID) | ORAL | Status: DC | PRN

## 2011-12-31 MED ORDER — ENOXAPARIN SODIUM 40 MG/0.4ML ~~LOC~~ SOLN
40.0000 mg | SUBCUTANEOUS | Status: DC
  Administered 2011-12-31: 40 mg via SUBCUTANEOUS
  Filled 2011-12-31 (×3): qty 0.4

## 2011-12-31 MED ORDER — SODIUM CHLORIDE 0.9 % IV SOLN
INTRAVENOUS | Status: AC
  Administered 2011-12-31: 16:00:00 via INTRAVENOUS

## 2011-12-31 MED ORDER — INSULIN ASPART PROT & ASPART (70-30 MIX) 100 UNIT/ML ~~LOC~~ SUSP
10.0000 [IU] | Freq: Two times a day (BID) | SUBCUTANEOUS | Status: DC
  Administered 2011-12-31 – 2012-01-01 (×2): 10 [IU] via SUBCUTANEOUS
  Filled 2011-12-31: qty 3

## 2011-12-31 MED ORDER — SODIUM CHLORIDE 0.9 % IV BOLUS (SEPSIS)
500.0000 mL | Freq: Once | INTRAVENOUS | Status: AC
  Administered 2011-12-31: 500 mL via INTRAVENOUS

## 2011-12-31 MED ORDER — ONDANSETRON HCL 4 MG PO TABS: 4.0000 mg | ORAL_TABLET | Freq: Four times a day (QID) | ORAL | Status: DC | PRN

## 2011-12-31 MED ORDER — SODIUM CHLORIDE 0.9 % IV SOLN: INTRAVENOUS | Status: DC

## 2011-12-31 MED ORDER — ONDANSETRON HCL 4 MG/2ML IJ SOLN: 4.0000 mg | Freq: Three times a day (TID) | INTRAMUSCULAR | Status: DC | PRN

## 2011-12-31 MED ORDER — ONDANSETRON HCL 4 MG/2ML IJ SOLN: 4.0000 mg | Freq: Four times a day (QID) | INTRAMUSCULAR | Status: DC | PRN

## 2011-12-31 MED ORDER — INSULIN ASPART 100 UNIT/ML ~~LOC~~ SOLN
0.0000 [IU] | Freq: Three times a day (TID) | SUBCUTANEOUS | Status: DC
  Administered 2011-12-31: 15 [IU] via SUBCUTANEOUS
  Administered 2012-01-01: 8 [IU] via SUBCUTANEOUS
  Administered 2012-01-01: 5 [IU] via SUBCUTANEOUS

## 2011-12-31 MED ORDER — SODIUM CHLORIDE 0.9 % IV BOLUS (SEPSIS)
1000.0000 mL | Freq: Once | INTRAVENOUS | Status: AC
  Administered 2011-12-31: 1000 mL via INTRAVENOUS

## 2011-12-31 MED ORDER — ATORVASTATIN CALCIUM 80 MG PO TABS
80.0000 mg | ORAL_TABLET | Freq: Every day | ORAL | Status: DC
  Filled 2011-12-31: qty 1

## 2011-12-31 MED ORDER — OXYCODONE-ACETAMINOPHEN 5-325 MG PO TABS: 1.0000 | ORAL_TABLET | ORAL | Status: DC | PRN

## 2011-12-31 MED ORDER — ACETAMINOPHEN 650 MG RE SUPP: 650.0000 mg | Freq: Four times a day (QID) | RECTAL | Status: DC | PRN

## 2011-12-31 NOTE — Consult Note (Signed)
Reason for Consult: Chest pain/left arm pain Referring Physician: [facility] hospitalist  [name] is an 78 y.o. male.  HPI: Patient is 78 year old male with past medical history significant for mild-to-moderate coronary artery disease, hypertension, diabetes mellitus, morbid obesity, hypercholesteremia, history of hiatus hernia, degenerative joint disease, was admitted today because of left-sided chest pain radiating to left arm off and on since last week associated with dizziness. Patient also gives history of exertional chest pain and dyspnea. States lately his blood sugar has been running high. Patient denies any cough fever chills. Denies any relation of chest pain to food breathing or movement. Patient had left cardiac cath approximately 2 years ago that showed diffuse distal LAD disease and moderate small vessel diagonal stenosis.   Past Medical History  Diagnosis Date  . Diabetes mellitus   . Hypertension   . Obesity   . H/O hiatal hernia   . Arthritis     Past Surgical History  Procedure Date  . Abdominal surgery     pt has abdominal scarring and states he had sx but not sure what kind  . Back surgery   . Cholecystectomy     Family History  Problem Relation Age of Onset  . Cancer Mother     Social History:  reports that he quit smoking about 15 years ago. His smoking use included Cigarettes. He does not have any smokeless tobacco history on file. He reports that he does not drink alcohol or use illicit drugs.  Allergies: No Known Allergies  Medications: I have reviewed the patient's current medications.  Results for orders placed during the hospital encounter of 12/31/11 (from the past 48 hour(s))  CBC WITH DIFFERENTIAL     Status: Abnormal   Collection Time   12/31/11 11:28 AM      Component Value Range Comment   WBC 6.4  4.0 - 10.5 K/uL    RBC 5.56  4.22 - 5.81 MIL/uL    Hemoglobin 17.3 (*) 13.0 - 17.0 g/dL    HCT 16.1  09.6 - 04.5 %    MCV 89.6  78.0 - 100.0  fL    MCH 31.1  26.0 - 34.0 pg    MCHC 34.7  30.0 - 36.0 g/dL    RDW 40.9  81.1 - 91.4 %    Platelets 149 (*) 150 - 400 K/uL    Neutrophils Relative 46  43 - 77 %    Neutro Abs 2.9  1.7 - 7.7 K/uL    Lymphocytes Relative 43  12 - 46 %    Lymphs Abs 2.7  0.7 - 4.0 K/uL    Monocytes Relative 6  3 - 12 %    Monocytes Absolute 0.4  0.1 - 1.0 K/uL    Eosinophils Relative 5  0 - 5 %    Eosinophils Absolute 0.3  0.0 - 0.7 K/uL    Basophils Relative 0  0 - 1 %    Basophils Absolute 0.0  0.0 - 0.1 K/uL   BASIC METABOLIC PANEL     Status: Abnormal   Collection Time   12/31/11 11:28 AM      Component Value Range Comment   Sodium 133 (*) 135 - 145 mEq/L    Potassium 4.6  3.5 - 5.1 mEq/L    Chloride 96  96 - 112 mEq/L    CO2 22  19 - 32 mEq/L    Glucose, Bld 366 (*) 70 - 99 mg/dL    BUN 13  6 -  23 mg/dL    Creatinine, Ser 0.10  0.50 - 1.35 mg/dL    Calcium 9.5  8.4 - 27.2 mg/dL    GFR calc non Af Amer 62 (*) >90 mL/min    GFR calc Af Amer 72 (*) >90 mL/min   POCT I-STAT TROPONIN I     Status: Normal   Collection Time   12/31/11 11:51 AM      Component Value Range Comment   Troponin i, poc 0.00  0.00 - 0.08 ng/mL    Comment 3            GLUCOSE, CAPILLARY     Status: Abnormal   Collection Time   12/31/11  4:30 PM      Component Value Range Comment   Glucose-Capillary 363 (*) 70 - 99 mg/dL    Comment 1 Notify RN     CARDIAC PANEL(CRET KIN+CKTOT+MB+TROPI)     Status: Normal   Collection Time   12/31/11  5:14 PM      Component Value Range Comment   Total CK 75  7 - 232 U/L    CK, MB 2.2  0.3 - 4.0 ng/mL    Troponin I <0.30  <0.30 ng/mL    Relative Index RELATIVE INDEX IS INVALID  0.0 - 2.5     Dg Chest 2 View  12/31/2011  *RADIOLOGY REPORT*  Clinical Data: Chest pain, shortness of breath  CHEST - 2 VIEW  Comparison: 07/09/2011  Findings: Stable moderate cardiomegaly.  Tortuous ectatic thoracic aorta.  Chronic prominent interstitial markings. No focal infiltrate or overt edema.  No  effusion.  Vascular clips in the upper abdomen.  Regional bones unremarkable.  IMPRESSION:  1.  Stable chronic interstitial changes and cardiomegaly.  No acute disease.  Original Report Authenticated By: Osa Craver, M.D.   Ct Head Wo Contrast  12/31/2011  *RADIOLOGY REPORT*  Clinical Data: Headache.  Dizziness.  CT HEAD WITHOUT CONTRAST  Technique:  Contiguous axial images were obtained from the base of the skull through the vertex without contrast.  Comparison: 01/06/2010  Findings: The brain has a normal appearance without evidence of old or acute infarction, mass lesion, hemorrhage, hydrocephalus or extra-axial collection.  Incidental cavum vergae.  Sinuses are clear.  No fluid in the middle ears or mastoids.  No calvarial abnormality.  IMPRESSION: Normal head CT for age.  Original Report Authenticated By: Thomasenia Sales, M.D.    Review of Systems  Constitutional: Negative for fever, chills and weight loss.  HENT: Negative for hearing loss.   Respiratory: Negative for cough, hemoptysis, sputum production and shortness of breath.   Cardiovascular: Positive for chest pain. Negative for palpitations, orthopnea, claudication and leg swelling.  Gastrointestinal: Negative for nausea, vomiting and abdominal pain.  Neurological: Positive for dizziness. Negative for headaches.   Blood pressure 126/81, pulse 82, temperature 98.6 F (37 C), temperature source Oral, resp. rate 20, height 5' 10.5" (1.791 m), weight 122.471 kg (270 lb), SpO2 96.00%. Physical Exam  Constitutional: He is oriented to person, place, and time. He appears well-developed and well-nourished.  HENT:  Head: Normocephalic and atraumatic.  Eyes: Conjunctivae are normal. Pupils are equal, round, and reactive to light. Left eye exhibits no discharge. No scleral icterus.  Neck: Normal range of motion. Neck supple. No JVD present. No tracheal deviation present. No thyromegaly present.  Cardiovascular: Normal rate, regular  rhythm and normal heart sounds.  Exam reveals no gallop and no friction rub.   No murmur heard. Respiratory: Effort  normal and breath sounds normal. No respiratory distress. He has no wheezes. He has no rales. He exhibits no tenderness.  GI: Soft. Bowel sounds are normal. He exhibits no distension. There is no tenderness. There is no rebound and no guarding.  Musculoskeletal: He exhibits no edema and no tenderness.  Lymphadenopathy:    He has no cervical adenopathy.  Neurological: He is alert and oriented to person, place, and time.    Assessment/Plan: Chest pain/left arm pain worrisome for angina rule out progression of coronary artery disease Uncontrolled diabetes mellitus Hypertension Hypercholesteremia History of fibrous hernia Degenerative joint disease   morbid obesity Remote tobacco abuse Plan Agree with present management Add low-dose beta blockers and statins Will schedule for nuclear stress test in a.m.  Conchetta Lamia N 12/31/2011, 8:16 PM

## 2011-12-31 NOTE — ED Provider Notes (Signed)
History     CSN: [id number]  Arrival date & time 12/31/11  1053   First MD Initiated Contact with Patient 12/31/11 1147      Chief Complaint  Patient presents with  . Headache  . Chest Pain    (Consider location/radiation/quality/duration/timing/severity/associated sxs/prior treatment) The history is provided by the patient.    Patient presents to emergency department complaining of a one to two-week history of intermittent dizziness, headache, chest pain, and generalized worsening weakness. Patient states that over the last 1-2 weeks he'll get occasional "swimmy headedness" that this is associated with headache. Patient states he'll feel generally weak stating  "I just don't feel good" and states that he tends to lay down to rest for longer periods of time each day. Patient states that over last week he's gone to bed at 3 PM in the afternoon and stayed in the bed and slept till morning. 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 Patient denies aggravating or alleviating factors for his symptoms. Patient states he'll get intermittent chest pain when he feels dizzy and weak. He states the chest pain will come and go lasting different amounts of time. Patient states he's had multiple episodes of some chest discomfort this morning. Patient states that he's had multiple episodes of chest pain throughout the days over last 1-2 weeks. He denies aggravating or alleviating factors. He denies known fevers, chills, visual changes, stiff neck, shortness of breath, cough, hemoptysis, abdominal pain, nausea, vomiting, diarrhea, dysuria hematuria, or blood in his stool. Patient states his primary care provider is Dr. Jeri Cos and he saw him approximately a month ago however he was not having the current symptoms at that time. Patient states he does not take his metformin the way that he should.  Patient states he lives at home with a daughter  Past Medical History  Diagnosis Date  . Diabetes mellitus   . Hypertension   . Obesity     Past Surgical History  Procedure Date  . Abdominal surgery     pt has abdominal scarring and states he had sx but not sure what kind    History reviewed. No pertinent family history.  History  Substance Use Topics  . Smoking status: Never Smoker   . Smokeless tobacco: Not on file  . Alcohol Use: No      Review of Systems  All other systems reviewed and are negative.    Allergies  Review of patient's allergies indicates no known allergies.  Home Medications   Current Outpatient Rx  Name Route Sig Dispense Refill  . ASPIRIN 81 MG PO TABS Oral Take 81 mg by mouth daily.      Marland Kitchen METFORMIN HCL 1000 MG PO TABS Oral Take 1,000 mg by mouth 2 (two) times daily with a meal.    . NAPROXEN SODIUM 220 MG PO TABS Oral Take 220 mg by mouth daily as needed. For pain    . OXYCODONE-ACETAMINOPHEN 5-325 MG PO TABS Oral Take 1 tablet by mouth every 4 (four) hours as needed. For pain      BP 130/82  Pulse 102  Temp 98.1 F (36.7 C) (Oral)  Resp 21  SpO2 95%  Physical Exam  Nursing note and vitals reviewed. Constitutional: He is oriented to person, place, and time. He appears well-developed and well-nourished. No distress.  HENT:  Head: Normocephalic and atraumatic.  Eyes: Conjunctivae and EOM are normal. Pupils are equal, round, and reactive to light.       No nystagmus  Neck: Normal range of motion. Neck supple.  Cardiovascular: Normal rate, regular rhythm, normal heart sounds and intact distal pulses.  Exam reveals no gallop and no friction rub.   No murmur heard. Pulmonary/Chest: Effort normal and breath sounds normal. No respiratory distress. He has no wheezes. He has no rales. He exhibits no tenderness.  Abdominal: Soft. Bowel sounds are normal. He exhibits no distension and no mass. There is no tenderness. There is no rebound and no  guarding.  Musculoskeletal: Normal range of motion. He exhibits edema. He exhibits no tenderness.       Trace edema of distal bilateral lower legs. No redness or skin changes  Neurological: He is alert and oriented to person, place, and time.  Skin: Skin is warm and dry. No rash noted. He is not diaphoretic. No erythema.  Psychiatric: He has a normal mood and affect.    ED Course  Procedures (including critical care time)   Date: 12/31/2011  Rate: 102  Rhythm: sinus tachycardia  QRS Axis: normal  Intervals: normal  ST/T Wave abnormalities: normal  Conduction Disutrbances:first-degree A-V block   Narrative Interpretation:   Old EKG Reviewed: non provocative compared to Jul 09, 2011    Labs Reviewed  CBC WITH DIFFERENTIAL - Abnormal; Notable for the following:    Hemoglobin 17.3 (*)     Platelets 149 (*)     All other components within normal limits  POCT I-STAT TROPONIN I  BASIC METABOLIC PANEL   Dg Chest 2 View  12/31/2011  *RADIOLOGY REPORT*  Clinical Data: Chest pain, shortness of breath  CHEST - 2 VIEW  Comparison: 07/09/2011  Findings: Stable moderate cardiomegaly.  Tortuous ectatic thoracic aorta.  Chronic prominent interstitial markings. No focal infiltrate or overt edema.  No effusion.  Vascular clips in the upper abdomen.  Regional bones unremarkable.  IMPRESSION:  1.  Stable chronic interstitial changes and cardiomegaly.  No acute disease.  Original Report Authenticated By: Thora Lance [name], M.D.     1. Chest pain   2. Diabetes mellitus   3. Hyperglycemia       MDM  [facility] to admit.         [place], [place] 12/31/11 1550

## 2011-12-31 NOTE — ED Notes (Signed)
Pt reports left sided chest pain, with shortness of breath, weakness and dizziness.

## 2011-12-31 NOTE — Progress Notes (Signed)
12/31/11 1415  OTHER  CSW Follow Up Status No follow-up needed     Unit based LCSW to be consulted if disposition needs are identified and/or if psychosocial issues arise.   Dionne Milo MSW [facility] Emergency Dept. Weekend/Social Worker [phone]

## 2011-12-31 NOTE — H&P (Signed)
Patient's PCP: Jeri Cos, MD Patient's Cardiologist: Dr. Sharyn Lull  Chief Complaint: Dizziness and chest pain  History of Present Illness: [name] is a 78 y.o. African American male with history of type 2 diabetes uncontrolled, hypertension, and obesity who presents with the above complaints.  Patient was discharged in January of 2013 with similar complaints.  At which time he had uncontrolled diabetes for which he was started on Lantus, he reports that he was not able to afford Lantus and since then has stopped taking this medication.  He only takes metformin for his diabetes.  He reports that at home his blood sugars have been uncontrolled.  He also notes that he's been urinating very frequently and has been trying to adequately hydrate himself.  Over the last week he has noted dizziness especially when he stands up or changes his head position quickly.  He has also noted intermittent left-sided chest pain with pain running down his left arm.  Currently denies any chest pain.  Denies any recent fevers, nausea, vomiting, abdominal pain, diarrhea, or vision changes.  Denies any shortness of breath.  Past Medical History  Diagnosis Date  . Diabetes mellitus   . Hypertension   . Obesity    Past Surgical History  Procedure Date  . Abdominal surgery     pt has abdominal scarring and states he had sx but not sure what kind   Family History  Problem Relation Age of Onset  . Cancer Mother    History   Social History  . Marital Status: Widowed    Spouse Name: N/A    Number of Children: N/A  . Years of Education: N/A   Occupational History  . Not on file.   Social History Main Topics  . Smoking status: Former Smoker    Quit date: 12/30/1996  . Smokeless tobacco: Not on file  . Alcohol Use: No  . Drug Use: No  . Sexually Active:    Other Topics Concern  . Not on file   Social History Narrative  . No narrative on file   Allergies: Review of patient's allergies  indicates no known allergies.  Meds: Scheduled Meds:   . aspirin EC  325 mg Oral Daily  . enoxaparin  40 mg Subcutaneous Q24H  . insulin aspart  0-15 Units Subcutaneous TID WC  . insulin aspart  6 Units Intravenous Once  . insulin aspart protamine-insulin aspart  10 Units Subcutaneous BID WC  . sodium chloride  1,000 mL Intravenous Once  . sodium chloride  500 mL Intravenous Once  . DISCONTD: sodium chloride   Intravenous STAT   Continuous Infusions:   . sodium chloride     PRN Meds:.acetaminophen, acetaminophen, ondansetron (ZOFRAN) IV, ondansetron, oxyCODONE-acetaminophen, DISCONTD: ondansetron (ZOFRAN) IV  Review of Systems: All systems reviewed with the patient and positive as per history of present illness, otherwise all other systems are negative.  Physical Exam: Blood pressure 139/96, pulse 79, temperature 97 F (36.1 C), temperature source Oral, resp. rate 21, height 5' 10.5" (1.791 m), weight 122.471 kg (270 lb), SpO2 98.00%. General: Awake, Oriented x3, No acute distress. HEENT: EOMI, Moist mucous membranes Neck: Supple CV: S1 and S2 Lungs: Clear to ascultation bilaterally Abdomen: Soft, Nontender, Nondistended, +bowel sounds. Ext: Good pulses. Trace edema. No clubbing or cyanosis noted. Neuro: Cranial Nerves II-XII grossly intact. Has 5/5 motor strength in upper and lower extremities. Chest: Mild pain reproducible on palpation.  Lab results:  [facility] 12/31/11 1128  NA 133*  K  4.6  CL 96  CO2 22  GLUCOSE 366*  BUN 13  CREATININE 1.10  CALCIUM 9.5  MG --  PHOS --   No results found for this basename: AST:2,ALT:2,ALKPHOS:2,BILITOT:2,PROT:2,ALBUMIN:2 in the last 72 hours No results found for this basename: LIPASE:2,AMYLASE:2 in the last 72 hours  Basename 12/31/11 1128  WBC 6.4  NEUTROABS 2.9  HGB 17.3*  HCT 49.8  MCV 89.6  PLT 149*   No results found for this basename: CKTOTAL:3,CKMB:3,CKMBINDEX:3,TROPONINI:3 in the last 72 hours No components  found with this basename: POCBNP:3 No results found for this basename: DDIMER in the last 72 hours No results found for this basename: HGBA1C:2 in the last 72 hours No results found for this basename: CHOL:2,HDL:2,LDLCALC:2,TRIG:2,CHOLHDL:2,LDLDIRECT:2 in the last 72 hours No results found for this basename: TSH,T4TOTAL,FREET3,T3FREE,THYROIDAB in the last 72 hours No results found for this basename: VITAMINB12:2,FOLATE:2,FERRITIN:2,TIBC:2,IRON:2,RETICCTPCT:2 in the last 72 hours Imaging results:  Dg Chest 2 View  12/31/2011  *RADIOLOGY REPORT*  Clinical Data: Chest pain, shortness of breath  CHEST - 2 VIEW  Comparison: 07/09/2011  Findings: Stable moderate cardiomegaly.  Tortuous ectatic thoracic aorta.  Chronic prominent interstitial markings. No focal infiltrate or overt edema.  No effusion.  Vascular clips in the upper abdomen.  Regional bones unremarkable.  IMPRESSION:  1.  Stable chronic interstitial changes and cardiomegaly.  No acute disease.  Original Report Authenticated By: Osa Craver, M.D.   Ct Head Wo Contrast  12/31/2011  *RADIOLOGY REPORT*  Clinical Data: Headache.  Dizziness.  CT HEAD WITHOUT CONTRAST  Technique:  Contiguous axial images were obtained from the base of the skull through the vertex without contrast.  Comparison: 01/06/2010  Findings: The brain has a normal appearance without evidence of old or acute infarction, mass lesion, hemorrhage, hydrocephalus or extra-axial collection.  Incidental cavum vergae.  Sinuses are clear.  No fluid in the middle ears or mastoids.  No calvarial abnormality.  IMPRESSION: Normal head CT for age.  Original Report Authenticated By: Thomasenia Sales, M.D.   Other results: EKG: unchanged from previous tracings, normal sinus rhythm.  Assessment & Plan by Problem: Chest pain Admit the patient as observation to telemetry and to trend troponins to rule the patient for acute coronary syndrome.  Given patient has history of mild coronary  disease, with diabetes and other risk factors, requested Dr. Sharyn Lull, cardiology, to evaluate the patient as the patient is complaining of pain running down his left arm at times.  Patient had a 2-D echocardiogram in January of 2013 that showed cavity size was normal, wall thickness was increased in a pattern of mild LVH, mild focal basal hypertrophy of the septum, systolic function was normal with ejection fraction of 55-60%, there were no regional wall motion abnormalities.  Uncontrolled type 2 diabetes with complications Patient reports that he has had difficulty affording Lantus.  Start the patient on NovoLog 70/30 further titration depending on patient's blood sugars.  Will request diabetic coordinator consultation for education and teaching the patient inpatient and outpatient.  Check hemoglobin A1c.  Last hemoglobin A1c in January of 2013 was 9.7.  Given that high of hemoglobin A1c suspect oral diabetic medications may not be enough to control his diabetes at this time.  Hypertension Not on any medications at this time.  Depending on patient's clinical course may benefit from being started on lisinopril for nephro protective effects of diabetes.  Hyponatremia Likely due to hyperosmolar hyperglycemia from uncontrolled diabetes.  Continue to monitor.  Dehydration Due to uncontrolled  diabetes.  Continue fluid resuscitation.  Patient reported feeling improved after receiving IV fluids in the emergency department.  Polycythemia Likely due to dehydration.  Continue to monitor.  6 mm subpleural nodule along the left major fissure per CT on 07/10/2011 Needs CT of the chest with contrast  as outpatient.  Will discuss the findings with the patient tomorrow.  Chronic kidney disease stage II Likely due to diabetes.  Obesity BMI 38.  Diet and exercise as outpatient.  Prophylaxis Lovenox  Code status Full code.  Disposition Admit to telemetry as observation.  Time spent on admission,  talking to the patient, and coordinating care was: 60 mins.  Jihad Brownlow A, MD 12/31/2011, 3:34 PM

## 2011-12-31 NOTE — Progress Notes (Signed)
12/31/11 1415  Discharge Planning  Type of Residence Private residence  Living Arrangements Children [place]etta [place] )  Home Care Services No  Support Systems Children  Do you have any problems obtaining your medications? No  Family/patient expects to be discharged to: Unsure  Once you are discharged, how will you get to your follow-up appointment? Family  Expected Discharge Date 01/03/12  Case Management Consult Needed No  Social Work Consult Needed No

## 2011-12-31 NOTE — ED Provider Notes (Signed)
Medical screening examination/treatment/procedure(s) were conducted as a shared visit with non-physician practitioner(s) and myself.  I personally evaluated the patient during the encounter  1 week of intermittent lightheadedness, weakness, chest pain. Not feeling well for several days with increasing fatigue, dizziness, chest pain and weakness.  Glynn Octave, MD 12/31/11 [phone]

## 2012-01-01 DIAGNOSIS — R079 Chest pain, unspecified: Principal | ICD-10-CM

## 2012-01-01 DIAGNOSIS — E785 Hyperlipidemia, unspecified: Secondary | ICD-10-CM | POA: Diagnosis present

## 2012-01-01 DIAGNOSIS — E119 Type 2 diabetes mellitus without complications: Secondary | ICD-10-CM

## 2012-01-01 DIAGNOSIS — I10 Essential (primary) hypertension: Secondary | ICD-10-CM

## 2012-01-01 LAB — CARDIAC PANEL(CRET KIN+CKTOT+MB+TROPI)
CK, MB: 2.2 ng/mL (ref 0.3–4.0)
Relative Index: INVALID (ref 0.0–2.5)
Troponin I: <0.3 ng/mL (ref <0.30)

## 2012-01-01 LAB — CBC
HCT: 48.3 % (ref 39.0–52.0)
Hemoglobin: 16.5 g/dL (ref 13.0–17.0)
MCH: 30.8 pg (ref 26.0–34.0)
MCHC: 34.2 g/dL (ref 30.0–36.0)
RDW: 14.7 % (ref 11.5–15.5)

## 2012-01-01 LAB — BASIC METABOLIC PANEL
BUN: 12 mg/dL (ref 6–23)
Creatinine, Ser: 1.02 mg/dL (ref 0.50–1.35)
GFR calc Af Amer: 79 mL/min — ABNORMAL LOW (ref >90)
GFR calc non Af Amer: 68 mL/min — ABNORMAL LOW (ref >90)
Glucose, Bld: 176 mg/dL — ABNORMAL HIGH (ref 70–99)

## 2012-01-01 LAB — LIPID PANEL
HDL: 41 mg/dL (ref >39)
LDL Cholesterol: 154 mg/dL — ABNORMAL HIGH (ref 0–99)
Triglycerides: 177 mg/dL — ABNORMAL HIGH (ref <150)
VLDL: 35 mg/dL (ref 0–40)

## 2012-01-01 LAB — GLUCOSE, CAPILLARY
Glucose-Capillary: 231 mg/dL — ABNORMAL HIGH (ref 70–99)
Glucose-Capillary: 265 mg/dL — ABNORMAL HIGH (ref 70–99)

## 2012-01-01 MED ORDER — INSULIN ASPART PROT & ASPART (70-30 MIX) 100 UNIT/ML ~~LOC~~ SUSP
15.0000 [IU] | Freq: Two times a day (BID) | SUBCUTANEOUS | Status: DC
  Filled 2012-01-01: qty 3

## 2012-01-01 MED ORDER — METOPROLOL TARTRATE 12.5 MG HALF TABLET: 12.5000 mg | ORAL_TABLET | Freq: Two times a day (BID) | ORAL | Status: DC

## 2012-01-01 MED ORDER — INSULIN NPH ISOPHANE & REGULAR (70-30) 100 UNIT/ML ~~LOC~~ SUSP: 15.0000 [IU] | Freq: Two times a day (BID) | SUBCUTANEOUS | Status: DC

## 2012-01-01 MED ORDER — CHLORHEXIDINE GLUCONATE CLOTH 2 % EX PADS: 6.0000 | MEDICATED_PAD | Freq: Every day | CUTANEOUS | Status: DC

## 2012-01-01 MED ORDER — ASPIRIN 325 MG PO TBEC: 325.0000 mg | DELAYED_RELEASE_TABLET | Freq: Every day | ORAL | Status: AC

## 2012-01-01 MED ORDER — MUPIROCIN 2 % EX OINT: 1.0000 "application " | TOPICAL_OINTMENT | Freq: Two times a day (BID) | CUTANEOUS | Status: AC

## 2012-01-01 MED ORDER — LISINOPRIL 2.5 MG PO TABS: 2.5000 mg | ORAL_TABLET | Freq: Every day | ORAL | Status: DC

## 2012-01-01 MED ORDER — MUPIROCIN 2 % EX OINT
1.0000 "application " | TOPICAL_OINTMENT | Freq: Two times a day (BID) | CUTANEOUS | Status: DC
  Filled 2012-01-01: qty 22

## 2012-01-01 MED ORDER — UNABLE TO FIND: Status: DC

## 2012-01-01 MED ORDER — LISINOPRIL 2.5 MG PO TABS
2.5000 mg | ORAL_TABLET | Freq: Every day | ORAL | Status: DC
  Filled 2012-01-01: qty 1

## 2012-01-01 MED ORDER — SIMVASTATIN 40 MG PO TABS: 40.0000 mg | ORAL_TABLET | Freq: Every evening | ORAL | Status: DC

## 2012-01-01 NOTE — Progress Notes (Signed)
Subjective: Denies any chest pain.  Had breakfast this morning unfortunately stress test cannot be done today.  Patient wondering if he can be discharged today and had stress test done as outpatient.  Objective: Vital signs in last 24 hours: Filed Vitals:   12/31/11 2014 12/31/11 2129 01/01/12 0458 01/01/12 0958  BP: 126/81 128/78 113/68 139/84  Pulse: 82 78 75 78  Temp: 98.6 F (37 C)  98.5 F (36.9 C)   TempSrc: Oral  Oral   Resp: 20  19   Height:      Weight:      SpO2: 96%  97%    Weight change:   Intake/Output Summary (Last 24 hours) at 01/01/12 1140 Last data filed at 01/01/12 1100  Gross per 24 hour  Intake      0 ml  Output   1400 ml  Net  -1400 ml    Physical Exam: General: Awake, Oriented, No acute distress. HEENT: EOMI. Neck: Supple CV: S1 and S2 Lungs: Clear to ascultation bilaterally Abdomen: Soft, Nontender, Nondistended, +bowel sounds. Ext: Good pulses. Trace edema.  Lab Results: Basic Metabolic Panel:  Lab 01/01/12 4540 12/31/11 1128  NA 135 133*  K 4.0 4.6  CL 100 96  CO2 24 22  GLUCOSE 176* 366*  BUN 12 13  CREATININE 1.02 1.10  CALCIUM 9.1 9.5  MG -- --  PHOS -- --   Liver Function Tests: No results found for this basename: AST:5,ALT:5,ALKPHOS:5,BILITOT:5,PROT:5,ALBUMIN:5 in the last 168 hours No results found for this basename: LIPASE:5,AMYLASE:5 in the last 168 hours No results found for this basename: AMMONIA:5 in the last 168 hours CBC:  Lab 01/01/12 0238 12/31/11 1128  WBC 6.6 6.4  NEUTROABS -- 2.9  HGB 16.5 17.3*  HCT 48.3 49.8  MCV 90.3 89.6  PLT 129* 149*   Cardiac Enzymes:  Lab 01/01/12 0238 12/31/11 2100 12/31/11 1714  CKTOTAL 89 85 75  CKMB 2.2 2.1 2.2  CKMBINDEX -- -- --  TROPONINI <0.30 <0.30 <0.30   BNP (last 3 results)  Basename 07/09/11 1459  PROBNP 73.3   CBG:  Lab 01/01/12 0634 12/31/11 2132 12/31/11 1630  GLUCAP 231* 244* 363*    Basename 12/31/11 1524  HGBA1C 12.0*   Other Labs: No  components found with this basename: POCBNP:3 No results found for this basename: DDIMER:2 in the last 168 hours  Lab 01/01/12 0238  CHOL 230*  HDL 41  LDLCALC 154*  TRIG 177*  CHOLHDL 5.6  LDLDIRECT --   No results found for this basename: TSH,T4TOTAL,FREET3,T3FREE,FREET4,THYROIDAB in the last 168 hours No results found for this basename: VITAMINB12:2,FOLATE:2,FERRITIN:2,TIBC:2,IRON:2,RETICCTPCT:2 in the last 168 hours  Micro Results: Recent Results (from the past 240 hour(s))  MRSA PCR SCREENING     Status: Abnormal   Collection Time   01/01/12  9:21 AM      Component Value Range Status Comment   MRSA by PCR POSITIVE (*) NEGATIVE Final     Studies/Results: Dg Chest 2 View  12/31/2011  *RADIOLOGY REPORT*  Clinical Data: Chest pain, shortness of breath  CHEST - 2 VIEW  Comparison: 07/09/2011  Findings: Stable moderate cardiomegaly.  Tortuous ectatic thoracic aorta.  Chronic prominent interstitial markings. No focal infiltrate or overt edema.  No effusion.  Vascular clips in the upper abdomen.  Regional bones unremarkable.  IMPRESSION:  1.  Stable chronic interstitial changes and cardiomegaly.  No acute disease.  Original Report Authenticated By: Osa Craver, M.D.   Ct Head Wo Contrast  12/31/2011  *  RADIOLOGY REPORT*  Clinical Data: Headache.  Dizziness.  CT HEAD WITHOUT CONTRAST  Technique:  Contiguous axial images were obtained from the base of the skull through the vertex without contrast.  Comparison: 01/06/2010  Findings: The brain has a normal appearance without evidence of old or acute infarction, mass lesion, hemorrhage, hydrocephalus or extra-axial collection.  Incidental cavum vergae.  Sinuses are clear.  No fluid in the middle ears or mastoids.  No calvarial abnormality.  IMPRESSION: Normal head CT for age.  Original Report Authenticated By: Thomasenia Sales, M.D.    Medications: I have reviewed the patient's current medications. Scheduled Meds:   . aspirin EC  325  mg Oral Daily  . atorvastatin  80 mg Oral q1800  . Chlorhexidine Gluconate Cloth  6 each Topical Q0600  . enoxaparin  40 mg Subcutaneous Q24H  . insulin aspart  0-15 Units Subcutaneous TID WC  . insulin aspart  6 Units Intravenous Once  . insulin aspart protamine-insulin aspart  15 Units Subcutaneous BID WC  . metoprolol tartrate  12.5 mg Oral BID  . mupirocin ointment  1 application Nasal BID  . sodium chloride  1,000 mL Intravenous Once  . sodium chloride  500 mL Intravenous Once  . DISCONTD: sodium chloride   Intravenous STAT  . DISCONTD: insulin aspart protamine-insulin aspart  10 Units Subcutaneous BID WC   Continuous Infusions:   . sodium chloride 75 mL/hr at 12/31/11 1625   PRN Meds:.acetaminophen, acetaminophen, ondansetron (ZOFRAN) IV, ondansetron, oxyCODONE-acetaminophen, DISCONTD: ondansetron (ZOFRAN) IV  Assessment/Plan: Chest pain  Patient ruled out for acute coronary syndrome.  No events noted on telemetry.  Appreciate Dr. Annitta Jersey input.  Since the patient had breakfast this morning, can not to stress test today.  Patient wants to be discharged and have the stress test as outpatient, discussed with Dr. Sharyn Lull, who requested the patient followup in his clinic.  Patient does understand the risks and benefits of leaving the hospital without having the stress test, however he indicated that [facility] has [facility] his daughter is having. Patient had a 2-D echocardiogram in January of 2013 that showed cavity size was normal, wall thickness was increased in a pattern of mild LVH, mild focal basal hypertrophy of the septum, systolic function was normal with ejection fraction of 55-60%, there were no regional wall motion abnormalities.  Continue aspirin and continue metoprolol at discharge.  Uncontrolled type 2 diabetes with complications  Patient reports that he has had difficulty affording Lantus.  Continue NovoLog 70/30 further titration depending  on patient's blood sugars.  Will arrange for outpatient diabetic teaching and education.  Hemoglobin A1c 12.0, suggesting an average blood sugar of 298.  Hypertension  Continue metoprolol.  Start low dose lisinopril for nephro protective effects of diabetes.    Hyponatremia  Likely due to hyperosmolar hyperglycemia from uncontrolled diabetes.  Resolved.   Dehydration  Resolved.  Due to uncontrolled diabetes.  Improved with fluid resuscitation.    Polycythemia  Likely due to dehydration.  Resolved.  6 mm subpleural nodule along the left major fissure per CT on 07/10/2011  Needs CT of the chest with contrast as outpatient as per his primary care physician.  Discussed with the patient prior to discharge.  Chronic kidney disease stage II  Likely due to diabetes.   Obesity  BMI 38. Diet and exercise as outpatient.   Prophylaxis  Lovenox   Code status  Full code.   Disposition  Discharge patient today.  LOS: 1 day  Marlene Beidler A, MD 01/01/2012, 11:40 AM

## 2012-01-01 NOTE — Progress Notes (Signed)
1246 (late entry) - pt d/c home with instructions, r/x, and f/u appointments. Pt and son verbalized understanding of instructions, pt home with family, escorted self out per request.  Ninetta Lights RN

## 2012-01-01 NOTE — Plan of Care (Signed)
Problem: Phase II Progression Outcomes Goal: Stress Test if indicated Outcome: Not Applicable Date Met:  01/01/12 Pt will be schedule for an outpatient stress test

## 2012-01-01 NOTE — Care Management Note (Unsigned)
    Page 1 of 1   01/01/2012     11:23:39 AM   CARE MANAGEMENT NOTE 01/01/2012  Patient:  [name], [name]   Account Number:  [id number]  Date Initiated:  01/01/2012  Documentation initiated by:  SIMMONS,Alyce Inscore  Subjective/Objective Assessment:   ADMITTED WITH CHEST PAIN; LIVES AT HOME WITH [name]; WAS IPTA- STILL DRIVES; USES [facility] ON [address] FOR RX.     Action/Plan:   DISCHARGE PLANNING DISCUSSED AT BEDSIDE.   Anticipated DC Date:  01/02/2012   Anticipated DC Plan:  HOME/SELF CARE      DC Planning Services  CM consult      Choice offered to / List presented to:             Status of service:  In process, will continue to follow Medicare Important Message given?   (If response is "NO", the following Medicare IM given date fields will be blank) Date Medicare IM given:   Date Additional Medicare IM given:    Discharge Disposition:    Per UR Regulation:  Reviewed for med. necessity/level of care/duration of stay  If discussed at Long Length of Stay Meetings, dates discussed:    Comments:  01/01/12  1123 Cassidy Tabet SIMMONS RN, BSN [phone] NCM WILL FOLLOW.

## 2012-01-01 NOTE — Discharge Summary (Signed)
Discharge Summary  [name] MR#: [id number]  DOB:06-08-1930  Date of Admission: 12/31/2011 Date of Discharge: 01/01/2012  Patient's PCP: Jeri Cos, MD  Attending Physician:Reniyah Gootee A  Consults: Treatment Team:  Robynn Pane, MD, Cardiology  Discharge Diagnoses: Principal Problem:  *Chest pain Active Problems:  DM (diabetes mellitus), type 2, uncontrolled with complications  HTN (hypertension), benign  Hyponatremia  CKD (chronic kidney disease), stage II  Dehydration  Dizziness  Polycythemia  Pulmonary nodule  Obesity  Hyperlipidemia   Brief Admitting History and Physical [name] is a 78 y.o. African American male with history of type 2 diabetes uncontrolled, hypertension, and obesity who presented on 12/31/2011 with dizziness and chest pain.  Discharge Medications Medication List  As of 01/01/2012 11:57 AM   STOP taking these medications         aspirin 81 MG tablet      metFORMIN 1000 MG tablet         TAKE these medications         aspirin 325 MG EC tablet   Take 1 tablet (325 mg total) by mouth daily.      insulin NPH-insulin regular (70-30) 100 UNIT/ML injection   Commonly known as: NOVOLIN 70/30   Inject 15 Units into the skin 2 (two) times daily with a meal.      lisinopril 2.5 MG tablet   Commonly known as: PRINIVIL,ZESTRIL   Take 1 tablet (2.5 mg total) by mouth daily.      metoprolol tartrate 12.5 mg Tabs   Commonly known as: LOPRESSOR   Take 0.5 tablets (12.5 mg total) by mouth 2 (two) times daily.      mupirocin ointment 2 %   Commonly known as: BACTROBAN   Apply 1 application topically 2 (two) times daily. Apply to nares twice daily for 5 days then discontinue.      naproxen sodium 220 MG tablet   Commonly known as: ANAPROX   Take 220 mg by mouth daily as needed. For pain      oxyCODONE-acetaminophen 5-325 MG per tablet   Commonly known as: PERCOCET   Take 1 tablet by mouth every 4 (four) hours as needed. For  pain      simvastatin 40 MG tablet   Commonly known as: ZOCOR   Take 1 tablet (40 mg total) by mouth every evening.      UNABLE TO FIND   60 insulin syringes and needles            Hospital Course: Chest pain  Patient ruled out for acute coronary syndrome. No events noted on telemetry. Appreciate Dr. Annitta Jersey input. Since the patient had breakfast on 01/01/2012, can not to stress test today. Patient wants to be discharged and have the stress test as outpatient, discussed with Dr. Sharyn Lull, who requested the patient followup in his clinic. Patient does understand the risks and benefits of leaving the hospital without having the stress test, however he indicated that he has to [facility] for the procedure his daughter is having. Patient had a 2-D echocardiogram in January of 2013 that showed cavity size was normal, wall thickness was increased in a pattern of mild LVH, mild focal basal hypertrophy of the septum, systolic function was normal with ejection fraction of 55-60%, there were no regional wall motion abnormalities. Continue aspirin and continue metoprolol at discharge.   Uncontrolled type 2 diabetes with complications  Patient reports that he has had difficulty affording Lantus. Continue NovoLog 70/30  further titration depending on patient's blood sugars. Will arrange for outpatient diabetic teaching and education. Hemoglobin A1c 12.0, suggesting an average blood sugar of 298.   Hypertension  Continue metoprolol. Started low dose lisinopril for nephro protective effects of diabetes prior to discharge.   Hyponatremia  Likely due to hyperosmolar hyperglycemia from uncontrolled diabetes. Resolved.   Dehydration  Resolved. Due to uncontrolled diabetes. Improved with fluid resuscitation.   Polycythemia  Likely due to dehydration. Resolved.   6 mm subpleural nodule along the left major fissure per CT on 07/10/2011  Needs CT of the chest with contrast as outpatient  as per his primary care physician. Discussed with the patient prior to discharge.   Chronic kidney disease stage II  Likely due to diabetes.   Obesity  BMI 38. Diet and exercise as outpatient.   Hyperlipidemia Started on statin prior to discharge.   Day of Discharge BP 139/84  Pulse 78  Temp 98.5 F (36.9 C) (Oral)  Resp 19  Ht 5' 10.5" (1.791 m)  Wt 122.471 kg (270 lb)  BMI 38.19 kg/m2  SpO2 97%  Results for orders placed during the hospital encounter of 12/31/11 (from the past 48 hour(s))  CBC WITH DIFFERENTIAL     Status: Abnormal   Collection Time   12/31/11 11:28 AM      Component Value Range Comment   WBC 6.4  4.0 - 10.5 K/uL    RBC 5.56  4.22 - 5.81 MIL/uL    Hemoglobin 17.3 (*) 13.0 - 17.0 g/dL    HCT 08.6  57.8 - 46.9 %    MCV 89.6  78.0 - 100.0 fL    MCH 31.1  26.0 - 34.0 pg    MCHC 34.7  30.0 - 36.0 g/dL    RDW 62.9  52.8 - 41.3 %    Platelets 149 (*) 150 - 400 K/uL    Neutrophils Relative 46  43 - 77 %    Neutro Abs 2.9  1.7 - 7.7 K/uL    Lymphocytes Relative 43  12 - 46 %    Lymphs Abs 2.7  0.7 - 4.0 K/uL    Monocytes Relative 6  3 - 12 %    Monocytes Absolute 0.4  0.1 - 1.0 K/uL    Eosinophils Relative 5  0 - 5 %    Eosinophils Absolute 0.3  0.0 - 0.7 K/uL    Basophils Relative 0  0 - 1 %    Basophils Absolute 0.0  0.0 - 0.1 K/uL   BASIC METABOLIC PANEL     Status: Abnormal   Collection Time   12/31/11 11:28 AM      Component Value Range Comment   Sodium 133 (*) 135 - 145 mEq/L    Potassium 4.6  3.5 - 5.1 mEq/L    Chloride 96  96 - 112 mEq/L    CO2 22  19 - 32 mEq/L    Glucose, Bld 366 (*) 70 - 99 mg/dL    BUN 13  6 - 23 mg/dL    Creatinine, Ser 2.44  0.50 - 1.35 mg/dL    Calcium 9.5  8.4 - 01.0 mg/dL    GFR calc non Af Amer 62 (*) >90 mL/min    GFR calc Af Amer 72 (*) >90 mL/min   POCT I-STAT TROPONIN I     Status: Normal   Collection Time   12/31/11 11:51 AM      Component Value Range Comment   Troponin i, poc  0.00  0.00 - 0.08 ng/mL     Comment 3            HEMOGLOBIN A1C     Status: Abnormal   Collection Time   12/31/11  3:24 PM      Component Value Range Comment   Hemoglobin A1C 12.0 (*) <5.7 %    Mean Plasma Glucose 298 (*) <117 mg/dL   GLUCOSE, CAPILLARY     Status: Abnormal   Collection Time   12/31/11  4:30 PM      Component Value Range Comment   Glucose-Capillary 363 (*) 70 - 99 mg/dL    Comment 1 Notify RN     CARDIAC PANEL(CRET KIN+CKTOT+MB+TROPI)     Status: Normal   Collection Time   12/31/11  5:14 PM      Component Value Range Comment   Total CK 75  7 - 232 U/L    CK, MB 2.2  0.3 - 4.0 ng/mL    Troponin I <0.30  <0.30 ng/mL    Relative Index RELATIVE INDEX IS INVALID  0.0 - 2.5   CARDIAC PANEL(CRET KIN+CKTOT+MB+TROPI)     Status: Normal   Collection Time   12/31/11  9:00 PM      Component Value Range Comment   Total CK 85  7 - 232 U/L    CK, MB 2.1  0.3 - 4.0 ng/mL    Troponin I <0.30  <0.30 ng/mL    Relative Index RELATIVE INDEX IS INVALID  0.0 - 2.5   GLUCOSE, CAPILLARY     Status: Abnormal   Collection Time   12/31/11  9:32 PM      Component Value Range Comment   Glucose-Capillary 244 (*) 70 - 99 mg/dL    Comment 1 Documented in Chart      Comment 2 Notify RN     CARDIAC PANEL(CRET KIN+CKTOT+MB+TROPI)     Status: Normal   Collection Time   01/01/12  2:38 AM      Component Value Range Comment   Total CK 89  7 - 232 U/L    CK, MB 2.2  0.3 - 4.0 ng/mL    Troponin I <0.30  <0.30 ng/mL    Relative Index RELATIVE INDEX IS INVALID  0.0 - 2.5   BASIC METABOLIC PANEL     Status: Abnormal   Collection Time   01/01/12  2:38 AM      Component Value Range Comment   Sodium 135  135 - 145 mEq/L    Potassium 4.0  3.5 - 5.1 mEq/L    Chloride 100  96 - 112 mEq/L    CO2 24  19 - 32 mEq/L    Glucose, Bld 176 (*) 70 - 99 mg/dL    BUN 12  6 - 23 mg/dL    Creatinine, Ser 2.95  0.50 - 1.35 mg/dL    Calcium 9.1  8.4 - 62.1 mg/dL    GFR calc non Af Amer 68 (*) >90 mL/min    GFR calc Af Amer 79 (*) >90 mL/min     CBC     Status: Abnormal   Collection Time   01/01/12  2:38 AM      Component Value Range Comment   WBC 6.6  4.0 - 10.5 K/uL    RBC 5.35  4.22 - 5.81 MIL/uL    Hemoglobin 16.5  13.0 - 17.0 g/dL    HCT 30.8  65.7 - 84.6 %    MCV 90.3  78.0 - 100.0 fL    MCH 30.8  26.0 - 34.0 pg    MCHC 34.2  30.0 - 36.0 g/dL    RDW 16.1  09.6 - 04.5 %    Platelets 129 (*) 150 - 400 K/uL   LIPID PANEL     Status: Abnormal   Collection Time   01/01/12  2:38 AM      Component Value Range Comment   Cholesterol 230 (*) 0 - 200 mg/dL    Triglycerides 409 (*) <150 mg/dL    HDL 41  >81 mg/dL    Total CHOL/HDL Ratio 5.6      VLDL 35  0 - 40 mg/dL    LDL Cholesterol 191 (*) 0 - 99 mg/dL   GLUCOSE, CAPILLARY     Status: Abnormal   Collection Time   01/01/12  6:34 AM      Component Value Range Comment   Glucose-Capillary 231 (*) 70 - 99 mg/dL    Comment 1 Documented in Chart      Comment 2 Notify RN     MRSA PCR SCREENING     Status: Abnormal   Collection Time   01/01/12  9:21 AM      Component Value Range Comment   MRSA by PCR POSITIVE (*) NEGATIVE   GLUCOSE, CAPILLARY     Status: Abnormal   Collection Time   01/01/12 11:46 AM      Component Value Range Comment   Glucose-Capillary 265 (*) 70 - 99 mg/dL    Comment 1 Notify RN       Dg Chest 2 View  12/31/2011  *RADIOLOGY REPORT*  Clinical Data: Chest pain, shortness of breath  CHEST - 2 VIEW  Comparison: 07/09/2011  Findings: Stable moderate cardiomegaly.  Tortuous ectatic thoracic aorta.  Chronic prominent interstitial markings. No focal infiltrate or overt edema.  No effusion.  Vascular clips in the upper abdomen.  Regional bones unremarkable.  IMPRESSION:  1.  Stable chronic interstitial changes and cardiomegaly.  No acute disease.  Original Report Authenticated By: Osa Craver, M.D.   Ct Head Wo Contrast  12/31/2011  *RADIOLOGY REPORT*  Clinical Data: Headache.  Dizziness.  CT HEAD WITHOUT CONTRAST  Technique:  Contiguous axial images were  obtained from the base of the skull through the vertex without contrast.  Comparison: 01/06/2010  Findings: The brain has a normal appearance without evidence of old or acute infarction, mass lesion, hemorrhage, hydrocephalus or extra-axial collection.  Incidental cavum vergae.  Sinuses are clear.  No fluid in the middle ears or mastoids.  No calvarial abnormality.  IMPRESSION: Normal head CT for age.  Original Report Authenticated By: Thomasenia Sales, M.D.     Disposition: Home with followup with primary care physician and Dr. Sharyn Lull  Diet: Diabetic diet  Activity: Resume as tolerated   Follow-up Appts: Discharge Orders    Future Orders Please Complete By Expires   Diet Carb Modified      Increase activity slowly      Discharge instructions      Comments:   Followup with Jeri Cos, MD (PCP) in 1 week for diabetes management and for CT of the chest for pulmonary nodule.  Followup with Dr. Sharyn Lull, cardiology in 1 week.  Followup with outpatient diabetic coordinator for management of diabetes.      TESTS THAT NEED FOLLOW-UP None  Time spent on discharge, talking to the patient, and coordinating care: 25 mins.   Signed: Cristal Ford, MD 01/01/2012,  11:57 AM

## 2012-01-01 NOTE — Progress Notes (Signed)
Subjective:  Patient denies any further chest pain or shortness of breath. Ate breakfast today stress test could not be done. Patient is eager to a home and is ready for discharge Objective:  Vital Signs in the last 24 hours: Temp:  [97 F (36.1 C)-98.6 F (37 C)] 98.5 F (36.9 C) (07/01 0458) Pulse Rate:  [75-97] 78  (07/01 0958) Resp:  [19-20] 19  (07/01 0458) BP: (101-142)/(68-96) 139/84 mmHg (07/01 0958) SpO2:  [94 %-98 %] 97 % (07/01 0458) Weight:  [122.471 kg (270 lb)] 122.471 kg (270 lb) (06/30 1500)  Intake/Output from previous day: 06/30 0701 - 07/01 0700 In: -  Out: 1200 [Urine:1200] Intake/Output from this shift: Total I/O In: -  Out: 200 [Urine:200]  Physical Exam: Neck: no adenopathy, no carotid bruit, no JVD and supple, symmetrical, trachea midline Lungs: clear to auscultation bilaterally Heart: regular rate and rhythm, S1, S2 normal, no murmur, click, rub or gallop Abdomen: soft, non-tender; bowel sounds normal; no masses,  no organomegaly Extremities: extremities normal, atraumatic, no cyanosis or edema  Lab Results:  Basename 01/01/12 0238 12/31/11 1128  WBC 6.6 6.4  HGB 16.5 17.3*  PLT 129* 149*    Basename 01/01/12 0238 12/31/11 1128  NA 135 133*  K 4.0 4.6  CL 100 96  CO2 24 22  GLUCOSE 176* 366*  BUN 12 13  CREATININE 1.02 1.10    Basename 01/01/12 0238 12/31/11 2100  TROPONINI <0.30 <0.30   Hepatic Function Panel No results found for this basename: PROT,ALBUMIN,AST,ALT,ALKPHOS,BILITOT,BILIDIR,IBILI in the last 72 hours  Basename 01/01/12 0238  CHOL 230*   No results found for this basename: PROTIME in the last 72 hours  Imaging: Imaging results have been reviewed and Dg Chest 2 View  12/31/2011  *RADIOLOGY REPORT*  Clinical Data: Chest pain, shortness of breath  CHEST - 2 VIEW  Comparison: 07/09/2011  Findings: Stable moderate cardiomegaly.  Tortuous ectatic thoracic aorta.  Chronic prominent interstitial markings. No focal infiltrate  or overt edema.  No effusion.  Vascular clips in the upper abdomen.  Regional bones unremarkable.  IMPRESSION:  1.  Stable chronic interstitial changes and cardiomegaly.  No acute disease.  Original Report Authenticated By: Osa Craver, M.D.   Ct Head Wo Contrast  12/31/2011  *RADIOLOGY REPORT*  Clinical Data: Headache.  Dizziness.  CT HEAD WITHOUT CONTRAST  Technique:  Contiguous axial images were obtained from the base of the skull through the vertex without contrast.  Comparison: 01/06/2010  Findings: The brain has a normal appearance without evidence of old or acute infarction, mass lesion, hemorrhage, hydrocephalus or extra-axial collection.  Incidental cavum vergae.  Sinuses are clear.  No fluid in the middle ears or mastoids.  No calvarial abnormality.  IMPRESSION: Normal head CT for age.  Original Report Authenticated By: Thomasenia Sales, M.D.    Cardiac Studies:  Assessment/Plan:  Stable angina MI ruled out  Uncontrolled diabetes mellitus  Hypertension  Hypercholesteremia  History of fibrous hernia  Degenerative joint disease  morbid obesity  Remote tobacco abuse  Plan Continue present management. We'll arrange for stress test as outpatient  LOS: 1 day    Naheim Burgen N 01/01/2012, 12:36 PM

## 2012-01-03 ENCOUNTER — Other Ambulatory Visit (HOSPITAL_COMMUNITY): Payer: Self-pay | Admitting: Cardiology

## 2012-01-03 DIAGNOSIS — R079 Chest pain, unspecified: Secondary | ICD-10-CM

## 2012-01-12 ENCOUNTER — Ambulatory Visit (HOSPITAL_COMMUNITY): Payer: Medicare Other

## 2012-01-12 ENCOUNTER — Other Ambulatory Visit (HOSPITAL_COMMUNITY): Payer: Self-pay | Admitting: Cardiology

## 2012-01-12 ENCOUNTER — Encounter (HOSPITAL_COMMUNITY)
Admission: RE | Admit: 2012-01-12 | Discharge: 2012-01-12 | Disposition: A | Discharge status: Home / Self Care | Payer: Medicare Other | Source: Ambulatory Visit | Attending: Cardiology | Admitting: Cardiology

## 2012-01-12 ENCOUNTER — Ambulatory Visit (HOSPITAL_COMMUNITY)
Admission: RE | Admit: 2012-01-12 | Discharge: 2012-01-12 | Payer: Medicare Other | Source: Ambulatory Visit | Attending: Cardiology | Admitting: Cardiology

## 2012-01-12 DIAGNOSIS — N189 Chronic kidney disease, unspecified: Secondary | ICD-10-CM | POA: Insufficient documentation

## 2012-01-12 DIAGNOSIS — E785 Hyperlipidemia, unspecified: Secondary | ICD-10-CM | POA: Insufficient documentation

## 2012-01-12 DIAGNOSIS — E119 Type 2 diabetes mellitus without complications: Secondary | ICD-10-CM | POA: Insufficient documentation

## 2012-01-12 DIAGNOSIS — I129 Hypertensive chronic kidney disease with stage 1 through stage 4 chronic kidney disease, or unspecified chronic kidney disease: Secondary | ICD-10-CM | POA: Insufficient documentation

## 2012-01-12 DIAGNOSIS — R079 Chest pain, unspecified: Secondary | ICD-10-CM | POA: Insufficient documentation

## 2012-01-12 DIAGNOSIS — E669 Obesity, unspecified: Secondary | ICD-10-CM | POA: Insufficient documentation

## 2012-01-12 DIAGNOSIS — R0602 Shortness of breath: Secondary | ICD-10-CM | POA: Insufficient documentation

## 2012-01-12 DIAGNOSIS — R42 Dizziness and giddiness: Secondary | ICD-10-CM | POA: Insufficient documentation

## 2012-01-17 ENCOUNTER — Other Ambulatory Visit: Payer: Self-pay

## 2012-01-17 ENCOUNTER — Ambulatory Visit (HOSPITAL_COMMUNITY): Payer: Medicare Other

## 2012-01-17 ENCOUNTER — Encounter (HOSPITAL_COMMUNITY)
Admission: RE | Admit: 2012-01-17 | Discharge: 2012-01-17 | Disposition: A | Discharge status: Home / Self Care | Payer: Medicare Other | Source: Ambulatory Visit | Attending: Cardiology | Admitting: Cardiology

## 2012-01-17 MED ORDER — TECHNETIUM TC 99M TETROFOSMIN IV KIT
10.0000 | PACK | Freq: Once | INTRAVENOUS | Status: AC | PRN
  Administered 2012-01-17: 10 via INTRAVENOUS

## 2012-01-17 MED ORDER — REGADENOSON 0.4 MG/5ML IV SOLN
0.4000 mg | Freq: Once | INTRAVENOUS | Status: AC
  Administered 2012-01-17: 0.4 mg via INTRAVENOUS

## 2012-01-17 MED ORDER — TECHNETIUM TC 99M TETROFOSMIN IV KIT
30.0000 | PACK | Freq: Once | INTRAVENOUS | Status: AC | PRN
  Administered 2012-01-17: 30 via INTRAVENOUS

## 2012-01-22 ENCOUNTER — Encounter: Payer: Self-pay | Admitting: Dietician

## 2012-01-22 ENCOUNTER — Encounter: Payer: Medicare Other | Attending: Nephrology | Admitting: Dietician

## 2012-01-22 VITALS — Ht 70.5 in | Wt 256.6 lb

## 2012-01-22 DIAGNOSIS — E1165 Type 2 diabetes mellitus with hyperglycemia: Secondary | ICD-10-CM

## 2012-01-22 DIAGNOSIS — Z713 Dietary counseling and surveillance: Secondary | ICD-10-CM | POA: Insufficient documentation

## 2012-01-22 DIAGNOSIS — E119 Type 2 diabetes mellitus without complications: Secondary | ICD-10-CM | POA: Insufficient documentation

## 2012-01-22 NOTE — Patient Instructions (Addendum)
   Use the diet juice.  Total Carbohydrate on the label will be 3 gms and sugar will be 2 gm.  Diet V8 Splash or Diet Ocean Spray.  At all meals and snack have some protein.  Check your blood sugar before you take your insulin and record in the book and take the blood glucose book to your appointment with Dr. Bascom Levels.  Call the pharmacist at [facility] and see how much the 70/30 insulin will cost.    Insulin vial is good for 30 days after you open it.    Maggie needs to explore insulin situation for you.  Keep the starches at 1 cup and have a protein (meat, poultry, fish) with it.

## 2012-01-22 NOTE — Progress Notes (Signed)
Medical Nutrition Therapy:  Appt start time: 1630 end time:  1730.   Assessment:  Primary concerns today: Comes today with his daughter.  He is quite sleepy during the visit.  He gives a history of having had recent chest pain with admission to the hospital.  He had no further pain and appears to have been d/c.  At home his daughter gives a history of being less active than in the past.  He will sleep until late in the morning instead of being up at his previous time of 8:00-9:00 AM.  He is not eating as many meals and the intake that he does have is often high in carb.  He has been started on 15 units of 70/30 insulin with two meals per day.  He does not take the evening dose until after the meal and then finds that he is often up eating late into the evening.  Continues to have high blood glucose levels.  BLOOD GLUCOSE:  Suppose to be testing 2 times per day.  Does not have his meter today, but reports that all blood glucose levels are 200 mg/dl or greater.  More often in the 300 range.  Today, a random blood glucose is 318 mg/dl and he has drunk 4 large glasses of water and gone to the BR twice during his 60 minute appointment.  MEDICATIONS: Medication review completed.  Given blood glucose levels, his weight and height, he most likely will benefit well with an increase in his insulin dose to accompany some dietary changes.   DIETARY INTAKE:  Usual eating pattern includes 1-2 meals and 2 snacks per day.  Everyday foods include carbs and proteins..  Avoided foods include "Not really any".    24-hr recall:  B ( AM): 11:00 Drink juice 8 ozr milk (whole milkl) 8 oz.and pack of nabs.  Take the insulin before the food/juice/milk Snk ( AM): none  L ( PM):  5:00 BBQ chicken, breast, and cabbage (2 cup) or corn 1 cup  or peas 1 cup, drinks water..   Snk ( PM): Drinks 64 oz of water per day. D ( PM): none, the 5:00 PM meal is his dinner. Snk ( PM): crackers and Vieana Sausages, or watermelon Takes  insulin at 7:00 PM  With the snack Beverages: juice, milk, water  Usual physical activity: Currently very limited.  Spends much of the day dozing.  He fell asleep during this appointment.  Estimated energy needs:HT: 70.5 in  WT: 256 lb   BMI: 36.4 kg/m2   Adj WT: 202 lb  (92 kg) 1700-1800 calories 195-200 g carbohydrates 125-130 g protein 46-48 g fat  Progress Towards Goal(s):  No progress.   Nutritional Diagnosis:  [place]-2.1 Inpaired nutrition utilization As related to .  As evidenced by blood glucose impairment as evidenced by diagnosis of type 2 diaetes, A1C of 12.0%, blood glucose levels 200 or greater on a consistent basis..    Intervention:  Nutrition Review of the need to omit concentrated sweets in the diet.  To utilize the insulin before meals to assist with lowering the meal glucose.  Need to eat regular meals and snacks at regular intervals and to incorporate activity into his diabetes treatment plan..  Handouts given during visit include:  Living well with Diabetes  Yellow card with exchanges  Label reading instructionss  Menu plan for 60 gm of CHO per meal  Snack list  Monitoring/Evaluation:  Dietary intake, exercise, blood glucose levels, and body weight in 4 weeks  after seeing Dr. Sharyn Lull.Marland Kitchen

## 2012-02-02 ENCOUNTER — Other Ambulatory Visit: Payer: Self-pay | Admitting: Internal Medicine

## 2012-02-06 ENCOUNTER — Encounter (HOSPITAL_COMMUNITY): Payer: Self-pay | Admitting: Emergency Medicine

## 2012-02-06 ENCOUNTER — Emergency Department (HOSPITAL_COMMUNITY)
Admission: EM | Admit: 2012-02-06 | Discharge: 2012-02-07 | Disposition: A | Discharge status: Home Health | Payer: Medicare Other | Attending: Emergency Medicine | Admitting: Emergency Medicine

## 2012-02-06 DIAGNOSIS — M129 Arthropathy, unspecified: Secondary | ICD-10-CM | POA: Insufficient documentation

## 2012-02-06 DIAGNOSIS — Z794 Long term (current) use of insulin: Secondary | ICD-10-CM | POA: Insufficient documentation

## 2012-02-06 DIAGNOSIS — E669 Obesity, unspecified: Secondary | ICD-10-CM | POA: Insufficient documentation

## 2012-02-06 DIAGNOSIS — Z79899 Other long term (current) drug therapy: Secondary | ICD-10-CM | POA: Insufficient documentation

## 2012-02-06 DIAGNOSIS — I10 Essential (primary) hypertension: Secondary | ICD-10-CM | POA: Insufficient documentation

## 2012-02-06 DIAGNOSIS — R51 Headache: Secondary | ICD-10-CM | POA: Insufficient documentation

## 2012-02-06 DIAGNOSIS — E119 Type 2 diabetes mellitus without complications: Secondary | ICD-10-CM | POA: Insufficient documentation

## 2012-02-06 DIAGNOSIS — Z87891 Personal history of nicotine dependence: Secondary | ICD-10-CM | POA: Insufficient documentation

## 2012-02-06 DIAGNOSIS — E785 Hyperlipidemia, unspecified: Secondary | ICD-10-CM | POA: Insufficient documentation

## 2012-02-06 LAB — GLUCOSE, CAPILLARY: Glucose-Capillary: 169 mg/dL — ABNORMAL HIGH (ref 70–99)

## 2012-02-06 NOTE — ED Provider Notes (Signed)
History     CSN: [id number]  Arrival date & time 02/06/12  1825   First MD Initiated Contact with Patient 02/06/12 2249      Chief Complaint  Patient presents with  . Headache    (Consider location/radiation/quality/duration/timing/severity/associated sxs/prior treatment) HPI Comments: [name] is a 78 y.o. Male who has had intermittent headache for 3 days. He occasionally gets blurred vision. He has had urinary frequency. He has been able to walk per his usual using his walking stick. He denies fever, chills, nausea, or vomiting. His appetite was decreased today. He has been using his medications as usual. Since arriving in the emergency department. His headache is almost gone. He does not know of any additional aggravating or palliative factors.  Patient is a 78 y.o. male presenting with headaches. The history is provided by the patient.  Headache     Past Medical History  Diagnosis Date  . Diabetes mellitus   . Hypertension   . Obesity   . H/O hiatal hernia   . Arthritis   . Hyperlipidemia     Past Surgical History  Procedure Date  . Abdominal surgery     pt has abdominal scarring and states he had sx but not sure what kind  . Back surgery   . Cholecystectomy     Family History  Problem Relation Age of Onset  . Cancer Mother     History  Substance Use Topics  . Smoking status: Former Smoker    Types: Cigarettes    Quit date: 12/30/1996  . Smokeless tobacco: Never Used  . Alcohol Use: No      Review of Systems  Neurological: Positive for headaches.  All other systems reviewed and are negative.    Allergies  Review of patient's allergies indicates no known allergies.  Home Medications   Current Outpatient Rx  Name Route Sig Dispense Refill  . INSULIN ISOPHANE & REGULAR (70-30) 100 UNIT/ML [place] SUSP Subcutaneous Inject 15 Units into the skin 2 (two) times daily with a meal. 30 mL 12  . LISINOPRIL 2.5 MG PO TABS Oral Take 1 tablet (2.5 mg  total) by mouth daily. 30 tablet 0  . METOPROLOL TARTRATE 12.5 MG HALF TABLET Oral Take 0.5 tablets (12.5 mg total) by mouth 2 (two) times daily. 60 tablet 0  . NAPROXEN SODIUM 220 MG PO TABS Oral Take 220 mg by mouth daily as needed. For pain    . OXYCODONE-ACETAMINOPHEN 5-325 MG PO TABS Oral Take 1 tablet by mouth every 4 (four) hours as needed. For pain    . SIMVASTATIN 40 MG PO TABS Oral Take 1 tablet (40 mg total) by mouth every evening. 30 tablet 0  . UNABLE TO FIND  60 insulin syringes and needles 60 Units 0    BP 126/85  Pulse 90  Temp 98.4 F (36.9 C) (Oral)  Resp 16  SpO2 97%  Physical Exam  Nursing note and vitals reviewed. Constitutional: He is oriented to person, place, and time. He appears well-developed and well-nourished. No distress.  HENT:  Head: Normocephalic and atraumatic.  Right Ear: External ear normal.  Left Ear: External ear normal.  Eyes: Conjunctivae and EOM are normal. Pupils are equal, round, and reactive to light.  Neck: Normal range of motion and phonation normal. Neck supple.  Cardiovascular: Normal rate, regular rhythm, normal heart sounds and intact distal pulses.   Pulmonary/Chest: Effort normal and breath sounds normal. He exhibits no bony tenderness.  Abdominal: Soft. Normal appearance.  There is no tenderness.  Musculoskeletal: Normal range of motion. He exhibits no edema and no tenderness.  Neurological: He is alert and oriented to person, place, and time. He has normal strength. No cranial nerve deficit or sensory deficit. He exhibits normal muscle tone. Coordination normal.       He was able to ambulate easily in emergency department without weakness or ataxia  Skin: Skin is warm, dry and intact.  Psychiatric: He has a normal mood and affect. His behavior is normal. Judgment and thought content normal.    ED Course  Procedures (including critical care time)  Labs Reviewed  GLUCOSE, CAPILLARY - Abnormal; Notable for the following:     Glucose-Capillary 169 (*)     All other components within normal limits   No results found.   1. Headache       MDM  Nonspecific headache, spontaneously improved. Emergency department, with mild her glycemia. Doubt significant metabolic illness, CNS abnormality, occult infection, metabolic instability, or impending vascular collapse. Patient would like to go home and see his PCP tomorrow for further evaluation.    Plan: Home Medications- Tylenol prn HA; Home Treatments- Rest, fluids; Recommended follow up- PCP f/u tomorrow.        Flint Melter, MD 02/07/12 [phone]

## 2012-02-06 NOTE — ED Notes (Signed)
CBG checked by EMT R Katia [id number]

## 2012-02-06 NOTE — ED Notes (Signed)
Pt c/o generalized HA x 3 days with blurry vision; pt sts seen by PCP yesterday for same

## 2012-02-07 NOTE — ED Notes (Signed)
Pt discharged home in good condition. 

## 2012-02-09 ENCOUNTER — Other Ambulatory Visit: Payer: Self-pay | Admitting: Internal Medicine

## 2012-03-08 ENCOUNTER — Emergency Department (HOSPITAL_COMMUNITY)
Admission: EM | Admit: 2012-03-08 | Discharge: 2012-03-08 | Disposition: A | Discharge status: Home / Self Care | Payer: Medicare Other | Attending: Emergency Medicine | Admitting: Emergency Medicine

## 2012-03-08 ENCOUNTER — Encounter (HOSPITAL_COMMUNITY): Payer: Self-pay | Admitting: Family Medicine

## 2012-03-08 DIAGNOSIS — T2121XA Burn of second degree of chest wall, initial encounter: Secondary | ICD-10-CM | POA: Insufficient documentation

## 2012-03-08 DIAGNOSIS — E785 Hyperlipidemia, unspecified: Secondary | ICD-10-CM | POA: Insufficient documentation

## 2012-03-08 DIAGNOSIS — I10 Essential (primary) hypertension: Secondary | ICD-10-CM | POA: Insufficient documentation

## 2012-03-08 DIAGNOSIS — M129 Arthropathy, unspecified: Secondary | ICD-10-CM | POA: Insufficient documentation

## 2012-03-08 DIAGNOSIS — E119 Type 2 diabetes mellitus without complications: Secondary | ICD-10-CM | POA: Insufficient documentation

## 2012-03-08 DIAGNOSIS — E669 Obesity, unspecified: Secondary | ICD-10-CM | POA: Insufficient documentation

## 2012-03-08 DIAGNOSIS — Z87891 Personal history of nicotine dependence: Secondary | ICD-10-CM | POA: Insufficient documentation

## 2012-03-08 DIAGNOSIS — Z794 Long term (current) use of insulin: Secondary | ICD-10-CM | POA: Insufficient documentation

## 2012-03-08 DIAGNOSIS — X12XXXA Contact with other hot fluids, initial encounter: Secondary | ICD-10-CM | POA: Insufficient documentation

## 2012-03-08 DIAGNOSIS — T310 Burns involving less than 10% of body surface: Secondary | ICD-10-CM | POA: Insufficient documentation

## 2012-03-08 LAB — CBC WITH DIFFERENTIAL/PLATELET
Eosinophils Relative: 3 % (ref 0–5)
HCT: 47.4 % (ref 39.0–52.0)
Hemoglobin: 15.9 g/dL (ref 13.0–17.0)
Lymphocytes Relative: 31 % (ref 12–46)
MCHC: 33.5 g/dL (ref 30.0–36.0)
MCV: 91.7 fL (ref 78.0–100.0)
Monocytes Absolute: 0.5 10*3/uL (ref 0.1–1.0)
Monocytes Relative: 6 % (ref 3–12)
Neutro Abs: 4.7 10*3/uL (ref 1.7–7.7)
WBC: 7.9 10*3/uL (ref 4.0–10.5)

## 2012-03-08 LAB — COMPREHENSIVE METABOLIC PANEL
BUN: 14 mg/dL (ref 6–23)
CO2: 23 mEq/L (ref 19–32)
Calcium: 9.3 mg/dL (ref 8.4–10.5)
Chloride: 104 mEq/L (ref 96–112)
Creatinine, Ser: 1.06 mg/dL (ref 0.50–1.35)
GFR calc Af Amer: 74 mL/min — ABNORMAL LOW (ref >90)
GFR calc non Af Amer: 64 mL/min — ABNORMAL LOW (ref >90)
Total Bilirubin: 0.2 mg/dL — ABNORMAL LOW (ref 0.3–1.2)

## 2012-03-08 MED ORDER — SODIUM CHLORIDE 0.9 % IV SOLN
1000.0000 mL | INTRAVENOUS | Status: DC
  Administered 2012-03-08: 1000 mL via INTRAVENOUS

## 2012-03-08 MED ORDER — SILVER SULFADIAZINE 1 % EX CREA
TOPICAL_CREAM | Freq: Once | CUTANEOUS | Status: DC
  Filled 2012-03-08 (×2): qty 85

## 2012-03-08 MED ORDER — MORPHINE SULFATE 4 MG/ML IJ SOLN
4.0000 mg | INTRAMUSCULAR | Status: DC | PRN
  Administered 2012-03-08 (×2): 4 mg via INTRAVENOUS
  Filled 2012-03-08: qty 2
  Filled 2012-03-08: qty 1

## 2012-03-08 MED ORDER — DOCUSATE SODIUM 100 MG PO CAPS: 100.0000 mg | ORAL_CAPSULE | Freq: Two times a day (BID) | ORAL | Status: DC

## 2012-03-08 MED ORDER — OXYCODONE-ACETAMINOPHEN 5-325 MG PO TABS: 1.0000 | ORAL_TABLET | Freq: Four times a day (QID) | ORAL | Status: DC | PRN

## 2012-03-08 MED ORDER — SILVER SULFADIAZINE 1 % EX CREA: TOPICAL_CREAM | Freq: Every day | CUTANEOUS | Status: DC

## 2012-03-08 MED ORDER — ONDANSETRON HCL 4 MG/2ML IJ SOLN
4.0000 mg | Freq: Four times a day (QID) | INTRAMUSCULAR | Status: DC | PRN
  Filled 2012-03-08: qty 2

## 2012-03-08 MED ORDER — SODIUM CHLORIDE 0.9 % IV SOLN
1000.0000 mL | Freq: Once | INTRAVENOUS | Status: AC
  Administered 2012-03-08: 1000 mL via INTRAVENOUS

## 2012-03-08 NOTE — ED Notes (Signed)
PT ambulated with baseline gait; VSS; A&Ox3; no signs of distress; respirations even and unlabored; skin warm and dry; no questions upon discharge.  
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
## 2012-03-08 NOTE — ED Notes (Signed)
Dr. Tseui at bedside.;  
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
## 2012-03-11 ENCOUNTER — Encounter (HOSPITAL_COMMUNITY): Payer: Self-pay | Admitting: Physical Medicine and Rehabilitation

## 2012-03-11 ENCOUNTER — Emergency Department (HOSPITAL_COMMUNITY)
Admission: EM | Admit: 2012-03-11 | Discharge: 2012-03-11 | Disposition: A | Discharge status: Home / Self Care | Payer: Medicare Other | Attending: Emergency Medicine | Admitting: Emergency Medicine

## 2012-03-11 DIAGNOSIS — Z809 Family history of malignant neoplasm, unspecified: Secondary | ICD-10-CM | POA: Insufficient documentation

## 2012-03-11 DIAGNOSIS — E119 Type 2 diabetes mellitus without complications: Secondary | ICD-10-CM | POA: Insufficient documentation

## 2012-03-11 DIAGNOSIS — X118XXA Contact with other hot tap-water, initial encounter: Secondary | ICD-10-CM | POA: Insufficient documentation

## 2012-03-11 DIAGNOSIS — T2101XA Burn of unspecified degree of chest wall, initial encounter: Secondary | ICD-10-CM

## 2012-03-11 DIAGNOSIS — E669 Obesity, unspecified: Secondary | ICD-10-CM | POA: Insufficient documentation

## 2012-03-11 DIAGNOSIS — T2121XA Burn of second degree of chest wall, initial encounter: Secondary | ICD-10-CM | POA: Insufficient documentation

## 2012-03-11 DIAGNOSIS — Z87891 Personal history of nicotine dependence: Secondary | ICD-10-CM | POA: Insufficient documentation

## 2012-03-11 DIAGNOSIS — Z794 Long term (current) use of insulin: Secondary | ICD-10-CM | POA: Insufficient documentation

## 2012-03-11 DIAGNOSIS — I10 Essential (primary) hypertension: Secondary | ICD-10-CM | POA: Insufficient documentation

## 2012-03-11 MED ORDER — SILVER SULFADIAZINE 1 % EX CREA
TOPICAL_CREAM | Freq: Every day | CUTANEOUS | Status: DC
  Administered 2012-03-11: 1 via TOPICAL
  Filled 2012-03-11: qty 85
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
## 2012-03-17 ENCOUNTER — Emergency Department (HOSPITAL_COMMUNITY)
Admission: EM | Admit: 2012-03-17 | Discharge: 2012-03-17 | Disposition: A | Discharge status: Home / Self Care | Payer: Medicare Other | Attending: Emergency Medicine | Admitting: Emergency Medicine

## 2012-03-17 ENCOUNTER — Encounter (HOSPITAL_COMMUNITY): Payer: Self-pay | Admitting: Physical Medicine and Rehabilitation

## 2012-03-17 DIAGNOSIS — Z87891 Personal history of nicotine dependence: Secondary | ICD-10-CM | POA: Insufficient documentation

## 2012-03-17 DIAGNOSIS — E119 Type 2 diabetes mellitus without complications: Secondary | ICD-10-CM | POA: Insufficient documentation

## 2012-03-17 DIAGNOSIS — T2101XA Burn of unspecified degree of chest wall, initial encounter: Secondary | ICD-10-CM | POA: Insufficient documentation

## 2012-03-17 DIAGNOSIS — T2102XA Burn of unspecified degree of abdominal wall, initial encounter: Secondary | ICD-10-CM | POA: Insufficient documentation

## 2012-03-17 DIAGNOSIS — E669 Obesity, unspecified: Secondary | ICD-10-CM | POA: Insufficient documentation

## 2012-03-17 DIAGNOSIS — I10 Essential (primary) hypertension: Secondary | ICD-10-CM | POA: Insufficient documentation

## 2012-03-17 DIAGNOSIS — Z79899 Other long term (current) drug therapy: Secondary | ICD-10-CM | POA: Insufficient documentation

## 2012-03-17 DIAGNOSIS — T310 Burns involving less than 10% of body surface: Secondary | ICD-10-CM | POA: Insufficient documentation

## 2012-03-17 DIAGNOSIS — Z794 Long term (current) use of insulin: Secondary | ICD-10-CM | POA: Insufficient documentation

## 2012-03-17 DIAGNOSIS — E785 Hyperlipidemia, unspecified: Secondary | ICD-10-CM | POA: Insufficient documentation

## 2012-03-17 DIAGNOSIS — M129 Arthropathy, unspecified: Secondary | ICD-10-CM | POA: Insufficient documentation

## 2012-03-17 MED ORDER — POLYSPORIN 500-10000 UNIT/GM EX OINT: 1.0000 "application " | TOPICAL_OINTMENT | Freq: Three times a day (TID) | CUTANEOUS | Status: DC
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
## 2012-07-02 ENCOUNTER — Encounter (HOSPITAL_COMMUNITY): Payer: Self-pay | Admitting: Emergency Medicine

## 2012-07-02 DIAGNOSIS — Z87891 Personal history of nicotine dependence: Secondary | ICD-10-CM | POA: Insufficient documentation

## 2012-07-02 DIAGNOSIS — E119 Type 2 diabetes mellitus without complications: Secondary | ICD-10-CM | POA: Insufficient documentation

## 2012-07-02 DIAGNOSIS — Z8719 Personal history of other diseases of the digestive system: Secondary | ICD-10-CM | POA: Insufficient documentation

## 2012-07-02 DIAGNOSIS — R51 Headache: Secondary | ICD-10-CM | POA: Insufficient documentation

## 2012-07-02 DIAGNOSIS — Z794 Long term (current) use of insulin: Secondary | ICD-10-CM | POA: Insufficient documentation

## 2012-07-02 DIAGNOSIS — I10 Essential (primary) hypertension: Secondary | ICD-10-CM | POA: Insufficient documentation

## 2012-07-02 DIAGNOSIS — Z79899 Other long term (current) drug therapy: Secondary | ICD-10-CM | POA: Insufficient documentation

## 2012-07-02 DIAGNOSIS — Z8739 Personal history of other diseases of the musculoskeletal system and connective tissue: Secondary | ICD-10-CM | POA: Insufficient documentation

## 2012-07-02 DIAGNOSIS — E785 Hyperlipidemia, unspecified: Secondary | ICD-10-CM | POA: Insufficient documentation

## 2012-07-02 DIAGNOSIS — E669 Obesity, unspecified: Secondary | ICD-10-CM | POA: Insufficient documentation

## 2012-07-02 LAB — CBC WITH DIFFERENTIAL/PLATELET
Eosinophils Absolute: 0.4 10*3/uL (ref 0.0–0.7)
Lymphocytes Relative: 40 % (ref 12–46)
Lymphs Abs: 3.1 10*3/uL (ref 0.7–4.0)
MCH: 30.5 pg (ref 26.0–34.0)
Neutro Abs: 3.8 10*3/uL (ref 1.7–7.7)
Neutrophils Relative %: 49 % (ref 43–77)
Platelets: 164 10*3/uL (ref 150–400)
RBC: 5.35 MIL/uL (ref 4.22–5.81)
WBC: 7.8 10*3/uL (ref 4.0–10.5)

## 2012-07-02 LAB — COMPREHENSIVE METABOLIC PANEL
ALT: 18 U/L (ref 0–53)
Alkaline Phosphatase: 86 U/L (ref 39–117)
Chloride: 102 mEq/L (ref 96–112)
GFR calc Af Amer: 77 mL/min — ABNORMAL LOW (ref >90)
Glucose, Bld: 294 mg/dL — ABNORMAL HIGH (ref 70–99)
Potassium: 4.2 mEq/L (ref 3.5–5.1)
Sodium: 137 mEq/L (ref 135–145)
Total Protein: 7.2 g/dL (ref 6.0–8.3)

## 2012-07-02 NOTE — ED Notes (Signed)
PT. REPORTS HEADACHE FOR SEVERAL DAYS WORSE TODAY UNRELIEVED BY OTC PAIN MEDICATIONS , DENIES INJURY , NO NAUSEA OR VOMITTING , NO BLURRED VISION .

## 2012-07-02 NOTE — ED Notes (Signed)
Pt has an 8" kitchen knife held for him in triage in a box with his label on it. "He had forgotten that it was in his back pocket, he used to open the dog food bag".

## 2012-07-03 ENCOUNTER — Emergency Department (HOSPITAL_COMMUNITY): Payer: Self-pay

## 2012-07-03 ENCOUNTER — Emergency Department (HOSPITAL_COMMUNITY)
Admission: EM | Admit: 2012-07-03 | Discharge: 2012-07-03 | Disposition: A | Discharge status: Home / Self Care | Payer: Self-pay | Attending: Emergency Medicine | Admitting: Emergency Medicine

## 2012-07-03 ENCOUNTER — Encounter (HOSPITAL_COMMUNITY): Payer: Self-pay | Admitting: Radiology

## 2012-07-03 DIAGNOSIS — R51 Headache: Secondary | ICD-10-CM

## 2012-07-03 MED ORDER — METOCLOPRAMIDE HCL 5 MG/ML IJ SOLN
10.0000 mg | Freq: Once | INTRAMUSCULAR | Status: AC
  Administered 2012-07-03: 10 mg via INTRAVENOUS
  Filled 2012-07-03: qty 2

## 2012-07-03 MED ORDER — DIPHENHYDRAMINE HCL 50 MG/ML IJ SOLN
25.0000 mg | Freq: Once | INTRAMUSCULAR | Status: AC
  Administered 2012-07-03: 25 mg via INTRAVENOUS
  Filled 2012-07-03: qty 1

## 2012-07-03 NOTE — ED Notes (Signed)
Pt discharged.Vital signs stable and GCS 15 
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
## 2012-07-03 NOTE — ED Notes (Signed)
Pt not able to  Go to CDU as no bed available.

## 2012-10-17 ENCOUNTER — Encounter (HOSPITAL_COMMUNITY): Payer: Self-pay | Admitting: *Deleted

## 2012-10-17 DIAGNOSIS — E1149 Type 2 diabetes mellitus with other diabetic neurological complication: Secondary | ICD-10-CM | POA: Insufficient documentation

## 2012-10-17 DIAGNOSIS — Z862 Personal history of diseases of the blood and blood-forming organs and certain disorders involving the immune mechanism: Secondary | ICD-10-CM | POA: Insufficient documentation

## 2012-10-17 DIAGNOSIS — Z8719 Personal history of other diseases of the digestive system: Secondary | ICD-10-CM | POA: Insufficient documentation

## 2012-10-17 DIAGNOSIS — I10 Essential (primary) hypertension: Secondary | ICD-10-CM | POA: Insufficient documentation

## 2012-10-17 DIAGNOSIS — Z794 Long term (current) use of insulin: Secondary | ICD-10-CM | POA: Insufficient documentation

## 2012-10-17 DIAGNOSIS — M129 Arthropathy, unspecified: Secondary | ICD-10-CM | POA: Insufficient documentation

## 2012-10-17 DIAGNOSIS — E669 Obesity, unspecified: Secondary | ICD-10-CM | POA: Insufficient documentation

## 2012-10-17 DIAGNOSIS — E1142 Type 2 diabetes mellitus with diabetic polyneuropathy: Secondary | ICD-10-CM | POA: Insufficient documentation

## 2012-10-17 DIAGNOSIS — Z87891 Personal history of nicotine dependence: Secondary | ICD-10-CM | POA: Insufficient documentation

## 2012-10-17 DIAGNOSIS — Z8639 Personal history of other endocrine, nutritional and metabolic disease: Secondary | ICD-10-CM | POA: Insufficient documentation

## 2012-10-17 DIAGNOSIS — Z7982 Long term (current) use of aspirin: Secondary | ICD-10-CM | POA: Insufficient documentation

## 2012-10-17 LAB — GLUCOSE, CAPILLARY: Glucose-Capillary: 285 mg/dL — ABNORMAL HIGH (ref 70–99)

## 2012-10-17 NOTE — ED Notes (Signed)
Pts feet are burning, mostly at night.  Pt states it is an ongoing problem and he has been seen before and they told him it was due to Diabetes.

## 2012-10-18 ENCOUNTER — Emergency Department (HOSPITAL_COMMUNITY)
Admission: EM | Admit: 2012-10-18 | Discharge: 2012-10-18 | Disposition: A | Discharge status: Home / Self Care | Payer: Medicare PPO | Attending: Emergency Medicine | Admitting: Emergency Medicine

## 2012-10-18 DIAGNOSIS — E1140 Type 2 diabetes mellitus with diabetic neuropathy, unspecified: Secondary | ICD-10-CM

## 2012-10-18 MED ORDER — AMITRIPTYLINE HCL 25 MG PO TABS: 25.0000 mg | ORAL_TABLET | Freq: Every evening | ORAL | Status: DC | PRN

## 2012-10-18 MED ORDER — OXYCODONE-ACETAMINOPHEN 5-325 MG PO TABS
2.0000 | ORAL_TABLET | Freq: Once | ORAL | Status: AC
  Administered 2012-10-18: 2 via ORAL
  Filled 2012-10-18: qty 2
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
## 2012-10-25 ENCOUNTER — Encounter (HOSPITAL_COMMUNITY): Payer: Self-pay | Admitting: *Deleted

## 2012-10-25 DIAGNOSIS — Z794 Long term (current) use of insulin: Secondary | ICD-10-CM | POA: Insufficient documentation

## 2012-10-25 DIAGNOSIS — E1142 Type 2 diabetes mellitus with diabetic polyneuropathy: Secondary | ICD-10-CM | POA: Insufficient documentation

## 2012-10-25 DIAGNOSIS — Z8739 Personal history of other diseases of the musculoskeletal system and connective tissue: Secondary | ICD-10-CM | POA: Insufficient documentation

## 2012-10-25 DIAGNOSIS — Z8719 Personal history of other diseases of the digestive system: Secondary | ICD-10-CM | POA: Insufficient documentation

## 2012-10-25 DIAGNOSIS — Z7982 Long term (current) use of aspirin: Secondary | ICD-10-CM | POA: Insufficient documentation

## 2012-10-25 DIAGNOSIS — E1149 Type 2 diabetes mellitus with other diabetic neurological complication: Secondary | ICD-10-CM | POA: Insufficient documentation

## 2012-10-25 DIAGNOSIS — Z79899 Other long term (current) drug therapy: Secondary | ICD-10-CM | POA: Insufficient documentation

## 2012-10-25 DIAGNOSIS — I10 Essential (primary) hypertension: Secondary | ICD-10-CM | POA: Insufficient documentation

## 2012-10-25 DIAGNOSIS — Z87891 Personal history of nicotine dependence: Secondary | ICD-10-CM | POA: Insufficient documentation

## 2012-10-25 DIAGNOSIS — R51 Headache: Secondary | ICD-10-CM | POA: Insufficient documentation

## 2012-10-25 DIAGNOSIS — E785 Hyperlipidemia, unspecified: Secondary | ICD-10-CM | POA: Insufficient documentation

## 2012-10-25 DIAGNOSIS — E669 Obesity, unspecified: Secondary | ICD-10-CM | POA: Insufficient documentation

## 2012-10-25 LAB — GLUCOSE, CAPILLARY: Glucose-Capillary: 240 mg/dL — ABNORMAL HIGH (ref 70–99)

## 2012-10-25 NOTE — ED Notes (Signed)
Pt c/o headache all day today, states he has taken 4 BC powders.  States the medicine helps it "ease off" but he comes back. No photophobia, no n/v.

## 2012-10-26 ENCOUNTER — Emergency Department (HOSPITAL_COMMUNITY)
Admission: EM | Admit: 2012-10-26 | Discharge: 2012-10-26 | Disposition: A | Discharge status: Home / Self Care | Payer: Medicare PPO | Attending: Emergency Medicine | Admitting: Emergency Medicine

## 2012-10-26 DIAGNOSIS — R51 Headache: Secondary | ICD-10-CM

## 2012-10-26 MED ORDER — HYDROXYZINE HCL 25 MG PO TABS: 25.0000 mg | ORAL_TABLET | Freq: Every evening | ORAL | Status: DC | PRN

## 2012-10-26 MED ORDER — FENTANYL CITRATE 0.05 MG/ML IJ SOLN
50.0000 ug | Freq: Once | INTRAMUSCULAR | Status: AC
  Administered 2012-10-26: 50 ug via INTRAVENOUS
  Filled 2012-10-26: qty 2

## 2012-10-26 MED ORDER — METOCLOPRAMIDE HCL 5 MG/ML IJ SOLN
10.0000 mg | Freq: Once | INTRAMUSCULAR | Status: AC
  Administered 2012-10-26: 10 mg via INTRAVENOUS
  Filled 2012-10-26: qty 2

## 2012-10-26 MED ORDER — SODIUM CHLORIDE 0.9 % IV BOLUS (SEPSIS)
1000.0000 mL | Freq: Once | INTRAVENOUS | Status: AC
Start: 2012-10-26 — End: 2012-10-26
  Administered 2012-10-26: 1000 mL via INTRAVENOUS

## 2012-10-26 MED ORDER — DEXAMETHASONE SODIUM PHOSPHATE 4 MG/ML IJ SOLN: 10.0000 mg | Freq: Once | INTRAMUSCULAR | Status: DC

## 2012-10-26 MED ORDER — HYDRALAZINE HCL 25 MG PO TABS: 25.0000 mg | ORAL_TABLET | Freq: Three times a day (TID) | ORAL | Status: DC

## 2012-10-26 MED ORDER — DIPHENHYDRAMINE HCL 50 MG/ML IJ SOLN
12.5000 mg | Freq: Once | INTRAMUSCULAR | Status: AC
  Administered 2012-10-26: 12.5 mg via INTRAVENOUS
  Filled 2012-10-26: qty 1

## 2012-10-26 NOTE — ED Provider Notes (Signed)
History     CSN: [id number]  Arrival date & time 10/25/12  2136   First MD Initiated Contact with Patient 10/26/12 0025      Chief Complaint  Patient presents with  . Headache    (Consider location/radiation/quality/duration/timing/severity/associated sxs/prior treatment) HPI History provided by patient. Headache ongoing for the last few days. Patient is diabetic with diabetic neuropathy and ongoing pain in his lower extremities - he attributes this to his inability to sleep at night. His primary care physician recently retired and he has not seen his new doctor yet. He was evaluated here recently with the same complaints, received temporary relief of his headache and was discharged home. He is concerned now because his headache returns. He denies any fevers or neck stiffness. No rash, weakness or numbness. No difficulty with gait or speech. Headache is all over, throbbing in quality and not radiating.no thunderclap or sudden onset symptoms Past Medical History  Diagnosis Date  . Diabetes mellitus   . Hypertension   . Obesity   . H/O hiatal hernia   . Arthritis   . Hyperlipidemia     Past Surgical History  Procedure Laterality Date  . Abdominal surgery      pt has abdominal scarring and states he had sx but not sure what kind  . Back surgery    . Cholecystectomy      Family History  Problem Relation Age of Onset  . Cancer Mother     History  Substance Use Topics  . Smoking status: Former Smoker    Types: Cigarettes    Quit date: 12/30/1996  . Smokeless tobacco: Never Used  . Alcohol Use: No      Review of Systems  Constitutional: Negative for fever and chills.  HENT: Negative for neck pain and neck stiffness.   Eyes: Negative for pain.  Respiratory: Negative for shortness of breath.   Cardiovascular: Negative for chest pain.  Gastrointestinal: Negative for abdominal pain.  Genitourinary: Negative for dysuria.  Musculoskeletal: Negative for back pain.  Skin:  Negative for rash.  Neurological: Positive for headaches. Negative for syncope, facial asymmetry and speech difficulty.  All other systems reviewed and are negative.    Allergies  Review of patient's allergies indicates no known allergies.  Home Medications   Current Outpatient Rx  Name  Route  Sig  Dispense  Refill  . amitriptyline (ELAVIL) 25 MG tablet   Oral   Take 1-2 tablets (25-50 mg total) by mouth at bedtime as needed (foot pain).   60 tablet   0   . aspirin EC 81 MG tablet   Oral   Take 81 mg by mouth daily.         . Aspirin-Salicylamide-Caffeine (BC HEADACHE PO)   Oral   Take 1 packet by mouth daily as needed (for pain).         Marland Kitchen doxazosin (CARDURA) 2 MG tablet   Oral   Take 2 mg by mouth at bedtime.         . insulin NPH-insulin regular (NOVOLIN 70/30) (70-30) 100 UNIT/ML injection   Subcutaneous   Inject 45 Units into the skin 2 (two) times daily with a meal.          . naproxen sodium (ANAPROX) 220 MG tablet   Oral   Take 220 mg by mouth 2 (two) times daily as needed (for pain).         Marland Kitchen oxyCODONE-acetaminophen (PERCOCET) 10-325 MG per tablet   Oral  Take 1 tablet by mouth every 4 (four) hours as needed for pain.           BP 142/88  Pulse 77  Temp(Src) 98.5 F (36.9 C) (Oral)  Resp 18  SpO2 99%  Physical Exam  Constitutional: He is oriented to person, place, and time. He appears well-developed and well-nourished.  HENT:  Head: Normocephalic and atraumatic.  Eyes: Conjunctivae and EOM are normal. Pupils are equal, round, and reactive to light.  Neck: Full passive range of motion without pain. Neck supple. No thyromegaly present.  No meningismus  Cardiovascular: Normal rate, regular rhythm, S1 normal, S2 normal and intact distal pulses.   Pulmonary/Chest: Effort normal and breath sounds normal.  Abdominal: Soft. Bowel sounds are normal. There is no tenderness. There is no CVA tenderness.  Musculoskeletal: Normal range of  motion.  Neurological: He is alert and oriented to person, place, and time. He has normal strength and normal reflexes. No cranial nerve deficit or sensory deficit. He displays a negative Romberg sign. GCS eye subscore is 4. GCS verbal subscore is 5. GCS motor subscore is 6.  Normal Gait  Skin: Skin is warm and dry. No rash noted. No cyanosis. Nails show no clubbing.  Psychiatric: He has a normal mood and affect. His speech is normal and behavior is normal.    ED Course  Procedures (including critical care time)  IV fluids and IV headache cocktail provided  3:00 AM patient is feeling much better, his normal neuro exam is unchanged, and he is requesting to be discharged home. Neurology referral provided. Prescription provided with precautions verbalized as understood. MDM  Headache with poor sleep hygiene in a diabetic with diabetic neuropathy that keeps him up at night  Improved with IV fluids and medications  No deficits or indication for emergent CT scan or LP at this time  Vital signs, old records and nursing notes reviewed  Stable for discharge home and outpatient followup        Sunnie Nielsen, MD 10/26/12 0302

## 2012-10-29 ENCOUNTER — Emergency Department (HOSPITAL_COMMUNITY): Payer: Medicare PPO

## 2012-10-29 ENCOUNTER — Encounter (HOSPITAL_COMMUNITY): Payer: Self-pay | Admitting: Emergency Medicine

## 2012-10-29 ENCOUNTER — Emergency Department (HOSPITAL_COMMUNITY)
Admission: EM | Admit: 2012-10-29 | Discharge: 2012-10-30 | Disposition: A | Discharge status: Home / Self Care | Payer: Medicare PPO | Attending: Emergency Medicine | Admitting: Emergency Medicine

## 2012-10-29 DIAGNOSIS — M479 Spondylosis, unspecified: Secondary | ICD-10-CM | POA: Insufficient documentation

## 2012-10-29 DIAGNOSIS — E669 Obesity, unspecified: Secondary | ICD-10-CM | POA: Insufficient documentation

## 2012-10-29 DIAGNOSIS — Z9889 Other specified postprocedural states: Secondary | ICD-10-CM | POA: Insufficient documentation

## 2012-10-29 DIAGNOSIS — Z794 Long term (current) use of insulin: Secondary | ICD-10-CM | POA: Insufficient documentation

## 2012-10-29 DIAGNOSIS — I10 Essential (primary) hypertension: Secondary | ICD-10-CM | POA: Insufficient documentation

## 2012-10-29 DIAGNOSIS — Z8639 Personal history of other endocrine, nutritional and metabolic disease: Secondary | ICD-10-CM | POA: Insufficient documentation

## 2012-10-29 DIAGNOSIS — M47812 Spondylosis without myelopathy or radiculopathy, cervical region: Secondary | ICD-10-CM

## 2012-10-29 DIAGNOSIS — E119 Type 2 diabetes mellitus without complications: Secondary | ICD-10-CM | POA: Insufficient documentation

## 2012-10-29 DIAGNOSIS — Z8719 Personal history of other diseases of the digestive system: Secondary | ICD-10-CM | POA: Insufficient documentation

## 2012-10-29 DIAGNOSIS — R51 Headache: Secondary | ICD-10-CM

## 2012-10-29 DIAGNOSIS — Z79899 Other long term (current) drug therapy: Secondary | ICD-10-CM | POA: Insufficient documentation

## 2012-10-29 DIAGNOSIS — Z862 Personal history of diseases of the blood and blood-forming organs and certain disorders involving the immune mechanism: Secondary | ICD-10-CM | POA: Insufficient documentation

## 2012-10-29 DIAGNOSIS — Z7982 Long term (current) use of aspirin: Secondary | ICD-10-CM | POA: Insufficient documentation

## 2012-10-29 MED ORDER — SODIUM CHLORIDE 0.9 % IV BOLUS (SEPSIS)
1000.0000 mL | Freq: Once | INTRAVENOUS | Status: AC
  Administered 2012-10-29: 1000 mL via INTRAVENOUS

## 2012-10-29 MED ORDER — KETOROLAC TROMETHAMINE 30 MG/ML IJ SOLN
30.0000 mg | Freq: Once | INTRAMUSCULAR | Status: AC
  Administered 2012-10-29: 30 mg via INTRAVENOUS
  Filled 2012-10-29: qty 1

## 2012-10-29 MED ORDER — DIPHENHYDRAMINE HCL 50 MG/ML IJ SOLN
25.0000 mg | Freq: Once | INTRAMUSCULAR | Status: AC
  Administered 2012-10-29: 25 mg via INTRAVENOUS
  Filled 2012-10-29: qty 1

## 2012-10-29 MED ORDER — METOCLOPRAMIDE HCL 5 MG/ML IJ SOLN
10.0000 mg | Freq: Once | INTRAMUSCULAR | Status: AC
  Administered 2012-10-29: 10 mg via INTRAVENOUS
  Filled 2012-10-29: qty 2

## 2012-10-29 NOTE — ED Provider Notes (Signed)
History     CSN: [id number]  Arrival date & time 10/29/12  2153   First MD Initiated Contact with Patient 10/29/12 2206      Chief Complaint  Patient presents with  . Headache    (Consider location/radiation/quality/duration/timing/severity/associated sxs/prior treatment) Patient is a 79 y.o. male presenting with headaches. The history is provided by the patient.  Headache He has been having a severe headache on her right side of his head for the last several months. He has been in the emergency several times and gets medication the ED which gives him relief but as soon as he gets home the pain recurs. He is unable to characterize the pain other than it is severe. He rates at 10/10. Nothing makes it better nothing makes it worse. He denies visual change, nausea, vomiting, weakness, numbness, tingling. He states it is not able to sleep because of the pain.  Past Medical History  Diagnosis Date  . Diabetes mellitus   . Hypertension   . Obesity   . H/O hiatal hernia   . Arthritis   . Hyperlipidemia     Past Surgical History  Procedure Laterality Date  . Abdominal surgery      pt has abdominal scarring and states he had sx but not sure what kind  . Back surgery    . Cholecystectomy      Family History  Problem Relation Age of Onset  . Cancer Mother     History  Substance Use Topics  . Smoking status: Former Smoker    Types: Cigarettes    Quit date: 12/30/1996  . Smokeless tobacco: Never Used  . Alcohol Use: No      Review of Systems  Neurological: Positive for headaches.  All other systems reviewed and are negative.    Allergies  Review of patient's allergies indicates no known allergies.  Home Medications   Current Outpatient Rx  Name  Route  Sig  Dispense  Refill  . amitriptyline (ELAVIL) 25 MG tablet   Oral   Take 1-2 tablets (25-50 mg total) by mouth at bedtime as needed (foot pain).   60 tablet   0   . aspirin EC 81 MG tablet   Oral   Take 81  mg by mouth daily.         . Aspirin-Salicylamide-Caffeine (BC HEADACHE PO)   Oral   Take 1 packet by mouth daily as needed (for pain).         Marland Kitchen doxazosin (CARDURA) 2 MG tablet   Oral   Take 2 mg by mouth at bedtime.         . hydrOXYzine (ATARAX/VISTARIL) 25 MG tablet   Oral   Take 1 tablet (25 mg total) by mouth at bedtime as needed.   30 tablet   0   . insulin NPH-insulin regular (NOVOLIN 70/30) (70-30) 100 UNIT/ML injection   Subcutaneous   Inject 45 Units into the skin 2 (two) times daily with a meal.          . naproxen sodium (ANAPROX) 220 MG tablet   Oral   Take 220 mg by mouth 2 (two) times daily as needed (for pain).         Marland Kitchen oxyCODONE-acetaminophen (PERCOCET) 10-325 MG per tablet   Oral   Take 1 tablet by mouth every 4 (four) hours as needed for pain.           BP 158/87  Pulse 82  Temp(Src) 98 F (36.7 C) (  Oral)  Resp 16  SpO2 95%  Physical Exam  Nursing note and vitals reviewed.  79 year old male, resting comfortably and in no acute distress. Vital signs are significant for hypertension with blood pressure 150/87. Oxygen saturation is 95%, which is normal. Head is normocephalic and atraumatic. Scalp is tender in the right occipital, parietal, and temporal areas. PERRLA, EOMI. Oropharynx is clear. Pupils are only 2 mm and it is difficult to visualize fundi. Neck is moderately tender on the right side superiorly. There is no adenopathy or JVD. There is pain with passive range of motion of the neck. Back is nontender and there is no CVA tenderness. Lungs are clear without rales, wheezes, or rhonchi. Chest is nontender. Heart has regular rate and rhythm without murmur. Abdomen is soft, flat, nontender without masses or hepatosplenomegaly and peristalsis is normoactive. Extremities have no cyanosis or edema, full range of motion is present. Skin is warm and dry without rash. Neurologic: Mental status is normal, cranial nerves are intact, there  are no motor or sensory deficits.  ED Course  Procedures (including critical care time)  Ct Cervical Spine Wo Contrast  10/30/2012  *RADIOLOGY REPORT*  Clinical Data: Neck pain.  Headaches and dizziness for several months.  CT CERVICAL SPINE WITHOUT CONTRAST  Technique:  Multidetector CT imaging of the cervical spine was performed. Multiplanar CT image reconstructions were also generated.  Comparison: Cervical spine radiographs 10/25/2010  Findings: There is reversal of the usual cervical lordosis which is likely related to patient positioning but muscle spasm can also have this appearance.  There are extensive degenerative changes throughout the cervical spine with narrowed cervical interspaces and bridging anterior and posterior osteophytes throughout.  There is partial ankylosis from C4-C6.  Degenerative changes throughout the cervical facet joints and at C1-2.  Changes suggest some progression since previous study.  Disc osteophyte complexes are present at C3-4, C4-5, C5-6, and C6-7, producing some degree of anterior effacement of the thecal sac.  Neural foraminal narrowing bilaterally is noted at the mid cervical levels as well.  No vertebral compression deformities.  No prevertebral soft tissue swelling.  No bone destruction.  No focal bone lesions.  Benign- appearing sclerosis in the right skull base.  No significant soft tissue infiltration.  IMPRESSION: Diffuse degenerative changes throughout the cervical spine resulting in partial lordosis from C4-C6.  Neural foraminal narrowing bilaterally at these levels.  Prominent disc osteophyte complexes causing anterior effacement of thecal sac.  No displaced fractures identified.   Original Report Authenticated By: Burman Nieves, M.D.    Images viewed by me.  1. Headache   2. Cervical spine arthritis       MDM  Right-sided headache which I feel is most likely referred pain from radiculopathy involving C1 and C2. Old records are reviewed and he  has had several ED visits for headaches and he has always responded well in the ED to a headache cocktail. He has had a negative CT of the head and he has had a normal sedimentation rate. I do not see any indication for repeating any of these tests but will get a CT of the cervical spine to assess for possible cervical disc causing his pain. He will be given metoclopramide, diphenhydramine, and ketorolac intravenously. Steroids will not be given because of his diabetes.  He got excellent relief of his headache with IV metoclopramide, IV diphenhydramine, and IV ketorolac. X-ray shows significant arthritis as would be expected with age. He'll be discharged with prescriptions for naproxen  and is to followup with his PCP. Is also given a prescription for Percocet for breakthrough pain.      Dione Booze, MD 10/30/12 [phone]

## 2012-10-29 NOTE — ED Notes (Signed)
PT. REPROTS PERSISTENT HEADACHE WITH SLIGHT DIZZINESS FOR SEVERAL DAYS , DENIES INJURY , AMBULATORY.

## 2012-10-30 MED ORDER — OXYCODONE-ACETAMINOPHEN 5-325 MG PO TABS: 1.0000 | ORAL_TABLET | ORAL | Status: DC | PRN

## 2012-10-30 MED ORDER — NAPROXEN 375 MG PO TABS: 375.0000 mg | ORAL_TABLET | Freq: Two times a day (BID) | ORAL | Status: DC

## 2013-01-12 ENCOUNTER — Emergency Department (HOSPITAL_COMMUNITY)
Admission: EM | Admit: 2013-01-12 | Discharge: 2013-01-12 | Disposition: A | Discharge status: Home / Self Care | Payer: Medicare PPO | Attending: Emergency Medicine | Admitting: Emergency Medicine

## 2013-01-12 ENCOUNTER — Encounter (HOSPITAL_COMMUNITY): Payer: Self-pay | Admitting: *Deleted

## 2013-01-12 DIAGNOSIS — Z862 Personal history of diseases of the blood and blood-forming organs and certain disorders involving the immune mechanism: Secondary | ICD-10-CM | POA: Insufficient documentation

## 2013-01-12 DIAGNOSIS — E1140 Type 2 diabetes mellitus with diabetic neuropathy, unspecified: Secondary | ICD-10-CM

## 2013-01-12 DIAGNOSIS — Z8639 Personal history of other endocrine, nutritional and metabolic disease: Secondary | ICD-10-CM | POA: Insufficient documentation

## 2013-01-12 DIAGNOSIS — E1149 Type 2 diabetes mellitus with other diabetic neurological complication: Secondary | ICD-10-CM | POA: Insufficient documentation

## 2013-01-12 DIAGNOSIS — Z87891 Personal history of nicotine dependence: Secondary | ICD-10-CM | POA: Insufficient documentation

## 2013-01-12 DIAGNOSIS — E669 Obesity, unspecified: Secondary | ICD-10-CM | POA: Insufficient documentation

## 2013-01-12 DIAGNOSIS — E1142 Type 2 diabetes mellitus with diabetic polyneuropathy: Secondary | ICD-10-CM | POA: Insufficient documentation

## 2013-01-12 DIAGNOSIS — Z8719 Personal history of other diseases of the digestive system: Secondary | ICD-10-CM | POA: Insufficient documentation

## 2013-01-12 DIAGNOSIS — I10 Essential (primary) hypertension: Secondary | ICD-10-CM | POA: Insufficient documentation

## 2013-01-12 DIAGNOSIS — G8929 Other chronic pain: Secondary | ICD-10-CM | POA: Insufficient documentation

## 2013-01-12 DIAGNOSIS — Z7982 Long term (current) use of aspirin: Secondary | ICD-10-CM | POA: Insufficient documentation

## 2013-01-12 DIAGNOSIS — Z794 Long term (current) use of insulin: Secondary | ICD-10-CM | POA: Insufficient documentation

## 2013-01-12 DIAGNOSIS — M129 Arthropathy, unspecified: Secondary | ICD-10-CM | POA: Insufficient documentation

## 2013-01-12 MED ORDER — OXYCODONE-ACETAMINOPHEN 5-325 MG PO TABS
2.0000 | ORAL_TABLET | Freq: Once | ORAL | Status: AC
  Administered 2013-01-12: 2 via ORAL
  Filled 2013-01-12: qty 2

## 2013-01-12 MED ORDER — OXYCODONE-ACETAMINOPHEN 5-325 MG PO TABS: 2.0000 | ORAL_TABLET | ORAL | Status: DC | PRN

## 2013-01-12 NOTE — ED Provider Notes (Signed)
History    CSN: [id number] Arrival date & time 01/12/13  0245  First MD Initiated Contact with Patient 01/12/13 [phone]     Chief Complaint  Patient presents with  . Foot Pain   (Consider location/radiation/quality/duration/timing/severity/associated sxs/prior Treatment) HPI History provided by patient. Is a diabetic with diabetic neuropathy and chronic lower extremity pain. His previous physician wrote him for Percocet every month. He currently does not have a primary care physician and is requesting Percocet. No new pain. Lower extremity pain is throbbing in quality and symmetric.  No new swelling. No trauma. No fevers or chills. No redness or ulcers. Symptoms moderate in severity. No known alleviating factors other than Percocet  Past Medical History  Diagnosis Date  . Diabetes mellitus   . Hypertension   . Obesity   . H/O hiatal hernia   . Arthritis   . Hyperlipidemia    Past Surgical History  Procedure Laterality Date  . Abdominal surgery      pt has abdominal scarring and states he had sx but not sure what kind  . Back surgery    . Cholecystectomy     Family History  Problem Relation Age of Onset  . Cancer Mother    History  Substance Use Topics  . Smoking status: Former Smoker    Types: Cigarettes    Quit date: 12/30/1996  . Smokeless tobacco: Never Used  . Alcohol Use: No    Review of Systems  Constitutional: Negative for fever and chills.  HENT: Negative for neck pain and neck stiffness.   Eyes: Negative for pain.  Respiratory: Negative for shortness of breath.   Cardiovascular: Negative for chest pain.  Gastrointestinal: Negative for abdominal pain.  Genitourinary: Negative for dysuria.  Musculoskeletal: Negative for back pain.  Skin: Negative for rash.  Neurological: Negative for headaches.  All other systems reviewed and are negative.    Allergies  Review of patient's allergies indicates no known allergies.  Home Medications   Current  Outpatient Rx  Name  Route  Sig  Dispense  Refill  . Aspirin-Acetaminophen-Caffeine (GOODYS EXTRA STRENGTH PO)   Oral   Take 1 Package by mouth every 8 (eight) hours as needed (pain).         . Aspirin-Caffeine (BC FAST PAIN RELIEF PO)   Oral   Take 1 Package by mouth 3 (three) times daily as needed (pain).         . insulin NPH-regular (NOVOLIN 70/30) (70-30) 100 UNIT/ML injection   Subcutaneous   Inject 45 Units into the skin 2 (two) times daily with a meal.          BP 146/87  Pulse 94  Temp(Src) 98.2 F (36.8 C) (Oral)  Resp 20  SpO2 95% Physical Exam  Constitutional: He is oriented to person, place, and time. He appears well-developed and well-nourished.  HENT:  Head: Normocephalic and atraumatic.  Eyes: EOM are normal. Pupils are equal, round, and reactive to light.  Neck: Neck supple.  Cardiovascular: Normal rate, regular rhythm and intact distal pulses.   Pulmonary/Chest: Effort normal and breath sounds normal. No respiratory distress.  Musculoskeletal: Normal range of motion.  Lower extremities with 1+ symmetric peripheral edema. No deep ulcers. Mildly decreased sensorium to light touch bilaterally. No point tenderness. No erythema or increased warmth. No cords. Distal neurovascular intact.  Neurological: He is alert and oriented to person, place, and time.  Skin: Skin is warm and dry.    ED Course  Procedures (including critical  care time)  Percocet provided with outpatient referrals.  Patient does not require any other medication refills  Stable for discharge home and outpatient followup MDM  Chronic lower extremity pain with history of diabetic neuropathy  No indication for labs or imaging at this time  Vital signs nurse's notes reviewed and considered  Sunnie Nielsen, MD 01/12/13 [phone]

## 2013-01-12 NOTE — ED Notes (Signed)
Pt c/o bilateral foot pain exacerbated by laying down. Pain relived by standing and walking. Pain described as throbbing that eases than returns.

## 2013-02-08 ENCOUNTER — Encounter (HOSPITAL_COMMUNITY): Payer: Self-pay | Admitting: Emergency Medicine

## 2013-02-08 ENCOUNTER — Emergency Department (HOSPITAL_COMMUNITY)
Admission: EM | Admit: 2013-02-08 | Discharge: 2013-02-08 | Disposition: A | Discharge status: Home / Self Care | Payer: Medicare PPO | Attending: Emergency Medicine | Admitting: Emergency Medicine

## 2013-02-08 ENCOUNTER — Emergency Department (HOSPITAL_COMMUNITY): Payer: Medicare PPO

## 2013-02-08 DIAGNOSIS — Z8739 Personal history of other diseases of the musculoskeletal system and connective tissue: Secondary | ICD-10-CM | POA: Insufficient documentation

## 2013-02-08 DIAGNOSIS — Z862 Personal history of diseases of the blood and blood-forming organs and certain disorders involving the immune mechanism: Secondary | ICD-10-CM | POA: Insufficient documentation

## 2013-02-08 DIAGNOSIS — Z87891 Personal history of nicotine dependence: Secondary | ICD-10-CM | POA: Insufficient documentation

## 2013-02-08 DIAGNOSIS — Z8719 Personal history of other diseases of the digestive system: Secondary | ICD-10-CM | POA: Insufficient documentation

## 2013-02-08 DIAGNOSIS — Z794 Long term (current) use of insulin: Secondary | ICD-10-CM | POA: Insufficient documentation

## 2013-02-08 DIAGNOSIS — R071 Chest pain on breathing: Secondary | ICD-10-CM | POA: Insufficient documentation

## 2013-02-08 DIAGNOSIS — Z8639 Personal history of other endocrine, nutritional and metabolic disease: Secondary | ICD-10-CM | POA: Insufficient documentation

## 2013-02-08 DIAGNOSIS — E119 Type 2 diabetes mellitus without complications: Secondary | ICD-10-CM | POA: Insufficient documentation

## 2013-02-08 DIAGNOSIS — I10 Essential (primary) hypertension: Secondary | ICD-10-CM | POA: Insufficient documentation

## 2013-02-08 DIAGNOSIS — R0789 Other chest pain: Secondary | ICD-10-CM

## 2013-02-08 DIAGNOSIS — E669 Obesity, unspecified: Secondary | ICD-10-CM | POA: Insufficient documentation

## 2013-02-08 LAB — BASIC METABOLIC PANEL
Calcium: 9.6 mg/dL (ref 8.4–10.5)
GFR calc Af Amer: 73 mL/min — ABNORMAL LOW (ref >90)
GFR calc non Af Amer: 63 mL/min — ABNORMAL LOW (ref >90)
Glucose, Bld: 370 mg/dL — ABNORMAL HIGH (ref 70–99)
Potassium: 4.6 mEq/L (ref 3.5–5.1)
Sodium: 135 mEq/L (ref 135–145)

## 2013-02-08 LAB — CBC
MCH: 30.9 pg (ref 26.0–34.0)
MCHC: 34.2 g/dL (ref 30.0–36.0)
Platelets: 160 10*3/uL (ref 150–400)
RDW: 14.7 % (ref 11.5–15.5)

## 2013-02-08 LAB — GLUCOSE, CAPILLARY: Glucose-Capillary: 307 mg/dL — ABNORMAL HIGH (ref 70–99)

## 2013-02-08 LAB — POCT I-STAT TROPONIN I

## 2013-02-08 MED ORDER — MORPHINE SULFATE 4 MG/ML IJ SOLN
4.0000 mg | Freq: Once | INTRAMUSCULAR | Status: DC
  Filled 2013-02-08: qty 1

## 2013-02-08 MED ORDER — OXYCODONE-ACETAMINOPHEN 5-325 MG PO TABS: 2.0000 | ORAL_TABLET | ORAL | Status: DC | PRN

## 2013-02-08 MED ORDER — ONDANSETRON HCL 4 MG/2ML IJ SOLN
4.0000 mg | Freq: Once | INTRAMUSCULAR | Status: DC
  Filled 2013-02-08: qty 2

## 2013-02-08 NOTE — ED Notes (Signed)
Unsuccessful IV start X 2.  VAS team paged.

## 2013-02-08 NOTE — ED Provider Notes (Signed)
CSN: [id number]     Arrival date & time 02/08/13  1231 History     First MD Initiated Contact with Patient 02/08/13 1400     Chief Complaint  Patient presents with  . Chest Pain   (Consider location/radiation/quality/duration/timing/severity/associated sxs/prior Treatment) HPI  79 year old male with history of diabetes, hypertension, hyperlipidemia, and stable angina presents complaining of chest pain. Patient reports developing chest wall pain which started last night. Pain was described as a achy sharp sensation, midsternal, nonradiating, mildly tender before he went to sleep but has been persistent throughout the day. Pain is worsened with flexing his neck and improves when he relaxes his neck. He did report some mild nausea this morning without vomiting. Reports occasional nonproductive cough for the past several days. He denies fever, chills, headache, sneeze, runny nose, diaphoresis, shortness of breath, abdominal pain, back pain, lightheadedness, dizziness, numbness or weakness. No other specific treatment tried. Denies any recent strenuous exercise or heavy lifting. States he had it cardiac stress test 6 months ago , unsure what the results, but did not receive any specific treatment.  Past Medical History  Diagnosis Date  . Diabetes mellitus   . Hypertension   . Obesity   . H/O hiatal hernia   . Arthritis   . Hyperlipidemia    Past Surgical History  Procedure Laterality Date  . Abdominal surgery      pt has abdominal scarring and states he had sx but not sure what kind  . Back surgery    . Cholecystectomy     Family History  Problem Relation Age of Onset  . Cancer Mother    History  Substance Use Topics  . Smoking status: Former Smoker    Types: Cigarettes    Quit date: 12/30/1996  . Smokeless tobacco: Never Used  . Alcohol Use: No    Review of Systems  All other systems reviewed and are negative.    Allergies  Review of patient's allergies indicates no known  allergies.  Home Medications   Current Outpatient Rx  Name  Route  Sig  Dispense  Refill  . Aspirin-Acetaminophen-Caffeine (GOODYS EXTRA STRENGTH PO)   Oral   Take 1 Package by mouth every 8 (eight) hours as needed (pain).         . Aspirin-Caffeine (BC FAST PAIN RELIEF PO)   Oral   Take 1 Package by mouth 3 (three) times daily as needed (pain).         . insulin NPH-regular (NOVOLIN 70/30) (70-30) 100 UNIT/ML injection   Subcutaneous   Inject 45 Units into the skin 2 (two) times daily with a meal.         . oxyCODONE-acetaminophen (PERCOCET/ROXICET) 5-325 MG per tablet   Oral   Take 2 tablets by mouth every 4 (four) hours as needed for pain.   10 tablet   0    BP 110/82  Pulse 105  Temp(Src) 98.5 F (36.9 C) (Oral)  Resp 20  Ht 5\' 10"  (1.778 m)  Wt 272 lb (123.378 kg)  BMI 39.03 kg/m2  SpO2 95% Physical Exam  Nursing note and vitals reviewed. Constitutional: He is oriented to person, place, and time. He appears well-developed and well-nourished. No distress.  Awake, alert, nontoxic appearance  HENT:  Head: Atraumatic.  Eyes: Conjunctivae are normal. Right eye exhibits no discharge. Left eye exhibits no discharge.  Neck: Normal range of motion. Neck supple. No JVD present.  Cardiovascular: Normal rate and regular rhythm.   Pulmonary/Chest: Effort normal.  No respiratory distress. He has no wheezes. He has no rales. He exhibits tenderness (midsternal chest wall pain on palpation without crepitus, emphysema, or overlying skin changes.).  Abdominal: Soft. There is no tenderness. There is no rebound.  Musculoskeletal: He exhibits no edema and no tenderness.  ROM appears intact, no obvious focal weakness  Neurological: He is alert and oriented to person, place, and time.  Skin: Skin is warm and dry. No rash noted.  Psychiatric: He has a normal mood and affect.    ED Course   Procedures (including critical care time)   Date: 02/08/2013  Rate: 98  Rhythm:  normal sinus rhythm  QRS Axis: normal  Intervals: PR prolonged  ST/T Wave abnormalities: nonspecific ST changes  Conduction Disutrbances:first-degree A-V block   Narrative Interpretation:   Old EKG Reviewed: unchanged    2:23 PM Patient presents with chest wall pain for more than 12 hours. Pain is reproducible on exam. Pain is atypical for cardiac etiology. Troponin negative.  Chest x-ray with mild chronic peribronchial thickening but no evidence of consolidation concerning for pneumonia. Low suspicion for PE.  3:00 PM CBG is 370. No anion gap. Will recheck CBC. I have also consulted with cardiologist, Dr. Sharyn Lull who will review pt's chart and will call back with further instruction.  Care discussed with attending.  3:14 PM Dr. Sharyn Lull has reviewed pt's chart and recommend pt to f/u in his office next week. My attending has also evaluated pt and felt he will benefit from a refill of his percocet as he has been out for a week.  Will give a short course of pain medication via prescription.  Otherwise pt stable for discharge.  Labs Reviewed  BASIC METABOLIC PANEL - Abnormal; Notable for the following:    Glucose, Bld 370 (*)    GFR calc non Af Amer 63 (*)    GFR calc Af Amer 73 (*)    All other components within normal limits  CBC  POCT I-STAT TROPONIN I   Dg Chest 2 View  02/08/2013   *RADIOLOGY REPORT*  Clinical Data: 79 year old male with chest pain, shortness of breath and cough  CHEST - 2 VIEW  Comparison: 12/31/2011  Findings: Cardiomegaly and mild peribronchial thickening again noted. There is no evidence of focal airspace disease, pulmonary edema, suspicious pulmonary nodule/mass, pleural effusion, or pneumothorax. No acute bony abnormalities are identified.  IMPRESSION: Cardiomegaly without evidence of acute cardiopulmonary disease.  Mild chronic peribronchial thickening.   Original Report Authenticated By: Harmon Pier, M.D.   1. Chest wall pain     MDM  BP 110/82  Pulse 105   Temp(Src) 98.5 F (36.9 C) (Oral)  Resp 20  Ht 5\' 10"  (1.778 m)  Wt 272 lb (123.378 kg)  BMI 39.03 kg/m2  SpO2 95%  I have reviewed nursing notes and vital signs. I personally reviewed the imaging tests through PACS system  I reviewed available ER/hospitalization records thought the EMR        Fayrene Helper, [place] 02/08/13 1551

## 2013-02-08 NOTE — ED Notes (Signed)
Patient presents to ED with complaints of chest pain that started last night and continued till this morning.

## 2013-02-08 NOTE — ED Provider Notes (Signed)
[name] is a 79 y.o. male who presents for evaluation of chest pain. The pain radiates from his neck. It is worse with neck movement. His shortness of breath, fever, chills, or cough.  Exam alert, cooperative. He denies chest pain at 15:40 Heart regular in rhythm. No murmur. Lungs clear to auscultation Neurologic nonfocal.  Assessment: Nonspecific chest pain. Doubt ACS, PE, or pneumonia.  Plan: Symptomatic treatment, followup with cardiology as an outpatient in one week  Medical screening examination/treatment/procedure(s) were conducted as a shared visit with non-physician practitioner(s) and myself.  I personally evaluated the patient during the encounter  Flint Melter, MD 02/08/13 1815

## 2013-05-22 ENCOUNTER — Emergency Department (HOSPITAL_COMMUNITY)
Admission: EM | Admit: 2013-05-22 | Discharge: 2013-05-22 | Disposition: A | Discharge status: Home / Self Care | Payer: Medicare PPO | Attending: Emergency Medicine | Admitting: Emergency Medicine

## 2013-05-22 ENCOUNTER — Encounter (HOSPITAL_COMMUNITY): Payer: Self-pay | Admitting: Emergency Medicine

## 2013-05-22 DIAGNOSIS — E1149 Type 2 diabetes mellitus with other diabetic neurological complication: Secondary | ICD-10-CM | POA: Insufficient documentation

## 2013-05-22 DIAGNOSIS — M1990 Unspecified osteoarthritis, unspecified site: Secondary | ICD-10-CM | POA: Insufficient documentation

## 2013-05-22 DIAGNOSIS — E669 Obesity, unspecified: Secondary | ICD-10-CM | POA: Insufficient documentation

## 2013-05-22 DIAGNOSIS — Z79899 Other long term (current) drug therapy: Secondary | ICD-10-CM | POA: Insufficient documentation

## 2013-05-22 DIAGNOSIS — E1142 Type 2 diabetes mellitus with diabetic polyneuropathy: Secondary | ICD-10-CM | POA: Insufficient documentation

## 2013-05-22 DIAGNOSIS — Z8719 Personal history of other diseases of the digestive system: Secondary | ICD-10-CM | POA: Insufficient documentation

## 2013-05-22 DIAGNOSIS — E1140 Type 2 diabetes mellitus with diabetic neuropathy, unspecified: Secondary | ICD-10-CM

## 2013-05-22 DIAGNOSIS — I10 Essential (primary) hypertension: Secondary | ICD-10-CM | POA: Insufficient documentation

## 2013-05-22 DIAGNOSIS — Z794 Long term (current) use of insulin: Secondary | ICD-10-CM | POA: Insufficient documentation

## 2013-05-22 DIAGNOSIS — Z87891 Personal history of nicotine dependence: Secondary | ICD-10-CM | POA: Insufficient documentation

## 2013-05-22 HISTORY — DX: Polyneuropathy, unspecified: G62.9

## 2013-05-22 LAB — GLUCOSE, CAPILLARY: Glucose-Capillary: 133 mg/dL — ABNORMAL HIGH (ref 70–99)

## 2013-05-22 MED ORDER — OXYCODONE-ACETAMINOPHEN 5-325 MG PO TABS: 1.0000 | ORAL_TABLET | Freq: Four times a day (QID) | ORAL | Status: DC | PRN

## 2013-05-22 MED ORDER — OXYCODONE-ACETAMINOPHEN 5-325 MG PO TABS
1.0000 | ORAL_TABLET | Freq: Once | ORAL | Status: AC
  Administered 2013-05-22: 1 via ORAL
  Filled 2013-05-22: qty 1

## 2013-05-22 MED ORDER — GABAPENTIN 300 MG PO CAPS: 300.0000 mg | ORAL_CAPSULE | Freq: Three times a day (TID) | ORAL | Status: DC

## 2013-05-22 NOTE — ED Provider Notes (Signed)
CSN: [id number]     Arrival date & time 05/22/13  1141 History   First MD Initiated Contact with Patient 05/22/13 1154     Chief Complaint  Patient presents with  . Foot Pain    HPI The patient has a history of diabetic neuropathy. He has had trouble with foot pain ongoing for several years. Patient states that last night he had a bad night and was having burning persistent pain in both feet. He has not had any recent injuries. He has not noticed any fevers or swelling. He has not noticed any rash. The pain increases with movement and palpation. Nothing helps. Patient has seen a primary Dr. as well as the emergency room in the past. His primary doctor prescribed Lyrica but he could not afford it. Patient has intermittently taken oxycodone. Patient is here requesting something for pain. Past Medical History  Diagnosis Date  . Diabetes mellitus   . Hypertension   . Obesity   . H/O hiatal hernia   . Arthritis   . Hyperlipidemia   . Neuropathy    Past Surgical History  Procedure Laterality Date  . Abdominal surgery      pt has abdominal scarring and states he had sx but not sure what kind  . Back surgery    . Cholecystectomy     Family History  Problem Relation Age of Onset  . Cancer Mother    History  Substance Use Topics  . Smoking status: Former Smoker    Types: Cigarettes    Quit date: 12/30/1996  . Smokeless tobacco: Never Used  . Alcohol Use: No    Review of Systems  Constitutional: Negative for fever.  All other systems reviewed and are negative.    Allergies  Review of patient's allergies indicates no known allergies.  Home Medications   Current Outpatient Rx  Name  Route  Sig  Dispense  Refill  . Aspirin-Acetaminophen-Caffeine (GOODYS EXTRA STRENGTH PO)   Oral   Take 1 Package by mouth every 8 (eight) hours as needed (pain).         . Aspirin-Caffeine (BC FAST PAIN RELIEF PO)   Oral   Take 1 Package by mouth 3 (three) times daily as needed (pain).          Marland Kitchen gabapentin (NEURONTIN) 300 MG capsule   Oral   Take 1 capsule (300 mg total) by mouth 3 (three) times daily.   90 capsule   0   . insulin NPH-regular (NOVOLIN 70/30) (70-30) 100 UNIT/ML injection   Subcutaneous   Inject 45 Units into the skin 2 (two) times daily with a meal.         . oxyCODONE-acetaminophen (PERCOCET/ROXICET) 5-325 MG per tablet   Oral   Take 2 tablets by mouth every 4 (four) hours as needed for pain.   10 tablet   0   . oxyCODONE-acetaminophen (PERCOCET/ROXICET) 5-325 MG per tablet   Oral   Take 1-2 tablets by mouth every 6 (six) hours as needed.   10 tablet   0    BP 125/92  Pulse 80  Temp(Src) 97.5 F (36.4 C) (Oral)  Resp 18  Ht 5\' 10"  (1.778 m)  Wt 270 lb 9.6 oz (122.743 kg)  BMI 38.83 kg/m2  SpO2 100% Physical Exam  Nursing note and vitals reviewed. Constitutional: He appears well-developed and well-nourished. No distress.  HENT:  Head: Normocephalic and atraumatic.  Right Ear: External ear normal.  Left Ear: External ear normal.  Eyes:  Conjunctivae are normal. Right eye exhibits no discharge. Left eye exhibits no discharge. No scleral icterus.  Neck: Neck supple. No tracheal deviation present.  Cardiovascular: Normal rate.   Pulmonary/Chest: Effort normal. No stridor. No respiratory distress.  Musculoskeletal: He exhibits tenderness. He exhibits no edema.  Tenderness to light palpation both feet, feet are warm and not cyanotic, diminished but palpable pulses bilateral cells pedis, no erythema, no rashes  Neurological: He is alert. Cranial nerve deficit: no gross deficits.  Skin: Skin is warm and dry. No rash noted.  Psychiatric: He has a normal mood and affect.    ED Course  Procedures (including critical care time) Labs Review Labs Reviewed - No data to display Imaging Review No results found.  EKG Interpretation   None       MDM   1. Diabetic neuropathy     Symptoms are likely related to his diabetic  neuropathy. Patient does not have evidence of acute vascular emergency or acute infection. I discussed the importance of following up with her primary care Dr. I think he would benefit from Lyrica or another similar medication.   Celene Kras, MD 05/22/13 1224

## 2013-05-22 NOTE — ED Notes (Signed)
Pt reports having pain to bilateral feet, reports hx of neuropathy but does not take any meds for it. Denies any sores or wounds on his feet. Denies injury to feet.

## 2013-05-28 ENCOUNTER — Ambulatory Visit: Payer: Medicare PPO | Attending: Internal Medicine | Admitting: Internal Medicine

## 2013-05-28 ENCOUNTER — Encounter: Payer: Self-pay | Admitting: Internal Medicine

## 2013-05-28 VITALS — BP 150/74 | HR 76 | Temp 98.4°F | Resp 18 | Ht 70.0 in | Wt 268.0 lb

## 2013-05-28 DIAGNOSIS — E1110 Type 2 diabetes mellitus with ketoacidosis without coma: Secondary | ICD-10-CM

## 2013-05-28 DIAGNOSIS — I10 Essential (primary) hypertension: Secondary | ICD-10-CM | POA: Insufficient documentation

## 2013-05-28 DIAGNOSIS — E1310 Other specified diabetes mellitus with ketoacidosis without coma: Secondary | ICD-10-CM

## 2013-05-28 DIAGNOSIS — E119 Type 2 diabetes mellitus without complications: Secondary | ICD-10-CM | POA: Insufficient documentation

## 2013-05-28 LAB — LIPID PANEL: Cholesterol: 219 mg/dL — ABNORMAL HIGH (ref 0–200)

## 2013-05-28 LAB — COMPLETE METABOLIC PANEL WITH GFR
Albumin: 3.6 g/dL (ref 3.5–5.2)
Alkaline Phosphatase: 72 U/L (ref 39–117)
BUN: 12 mg/dL (ref 6–23)
GFR, Est Non African American: 67 mL/min
Glucose, Bld: 200 mg/dL — ABNORMAL HIGH (ref 70–99)
Potassium: 4.2 mEq/L (ref 3.5–5.3)

## 2013-05-28 LAB — CBC WITH DIFFERENTIAL/PLATELET
Basophils Absolute: 0 10*3/uL (ref 0.0–0.1)
HCT: 46.3 % (ref 39.0–52.0)
Lymphocytes Relative: 38 % (ref 12–46)
Monocytes Absolute: 0.4 10*3/uL (ref 0.1–1.0)
Neutro Abs: 2.8 10*3/uL (ref 1.7–7.7)
Platelets: 168 10*3/uL (ref 150–400)
RDW: 15.4 % (ref 11.5–15.5)
WBC: 5.6 10*3/uL (ref 4.0–10.5)

## 2013-05-28 LAB — URIC ACID: Uric Acid, Serum: 5.5 mg/dL (ref 4.0–7.8)

## 2013-05-28 MED ORDER — METOPROLOL TARTRATE 25 MG PO TABS: 25.0000 mg | ORAL_TABLET | Freq: Two times a day (BID) | ORAL | Status: DC

## 2013-05-28 MED ORDER — TRAMADOL HCL 50 MG PO TABS: 50.0000 mg | ORAL_TABLET | Freq: Two times a day (BID) | ORAL | Status: DC | PRN

## 2013-05-28 MED ORDER — INSULIN NPH ISOPHANE & REGULAR (70-30) 100 UNIT/ML ~~LOC~~ SUSP: 50.0000 [IU] | Freq: Two times a day (BID) | SUBCUTANEOUS | Status: DC

## 2013-05-28 MED ORDER — ASPIRIN EC 81 MG PO TBEC: 81.0000 mg | DELAYED_RELEASE_TABLET | Freq: Every day | ORAL | Status: DC

## 2013-05-28 NOTE — Progress Notes (Signed)
Pt here to establish care for uncontrolled Diabetes with insulin  States medication is too expensive,noncompliant C/o blurry vision,polyuria Visual acuity 20/40 both eyes

## 2013-05-28 NOTE — Progress Notes (Signed)
Patient ID: [name], male   DOB: 03-08-1930, 79 y.o.   MRN: [id number]   CC:  HPI: 79 year old male with a history of insulin-dependent diabetes, presents here with multiple complaints. The patient complains of burning pain in both his feet. He has had a couple of ED visits alone this year for the same. He has received prescriptions for Percocet from the ER. Is currently taking gabapentin for his neuropathic pain  He is noncompliant with the amount of insulin that he is supposed to take  He states that he cannot afford to fill his insulin prescription for $45 a month He checks her sugar once a day and it runs in in the 300 range Today in the clinic is 174 He also has a history of hypertension but is currently not taking anything for this, systolic blood pressures in the 150s  He also complains of intermittent chest pain and does not report any history of coronary artery disease, he has been recommended to take a baby aspirin, he has never had a stress test   No Known Allergies Past Medical History  Diagnosis Date  . Diabetes mellitus   . Hypertension   . Obesity   . H/O hiatal hernia   . Arthritis   . Hyperlipidemia   . Neuropathy    Current Outpatient Prescriptions on File Prior to Visit  Medication Sig Dispense Refill  . gabapentin (NEURONTIN) 300 MG capsule Take 1 capsule (300 mg total) by mouth 3 (three) times daily.  90 capsule  0  . oxyCODONE-acetaminophen (PERCOCET/ROXICET) 5-325 MG per tablet Take 1-2 tablets by mouth every 6 (six) hours as needed.  10 tablet  0  . Aspirin-Acetaminophen-Caffeine (GOODYS EXTRA STRENGTH PO) Take 1 Package by mouth every 8 (eight) hours as needed (pain).      . Aspirin-Caffeine (BC FAST PAIN RELIEF PO) Take 1 Package by mouth 3 (three) times daily as needed (pain).      Marland Kitchen oxyCODONE-acetaminophen (PERCOCET/ROXICET) 5-325 MG per tablet Take 2 tablets by mouth every 4 (four) hours as needed for pain.  10 tablet  0  . [DISCONTINUED]  hydrALAZINE (APRESOLINE) 25 MG tablet Take 1 tablet (25 mg total) by mouth 3 (three) times daily.  30 tablet  0   No current facility-administered medications on file prior to visit.   Family History  Problem Relation Age of Onset  . Cancer Mother    History   Social History  . Marital Status: Widowed    Spouse Name: N/A    Number of Children: N/A  . Years of Education: N/A   Occupational History  . Not on file.   Social History Main Topics  . Smoking status: Former Smoker    Types: Cigarettes    Quit date: 12/30/1996  . Smokeless tobacco: Never Used  . Alcohol Use: No  . Drug Use: No  . Sexual Activity: Not on file   Other Topics Concern  . Not on file   Social History Narrative  . No narrative on file    Review of Systems  Constitutional: Negative for fever, chills, diaphoresis, activity change, appetite change and fatigue.  HENT: Negative for ear pain, nosebleeds, congestion, facial swelling, rhinorrhea, neck pain, neck stiffness and ear discharge.   Eyes: Negative for pain, discharge, redness, itching and visual disturbance.  Respiratory: Negative for cough, choking, chest tightness, shortness of breath, wheezing and stridor.   Cardiovascular: Negative for chest pain, palpitations and leg swelling.  Gastrointestinal: Negative for abdominal distention.  Genitourinary: Negative for dysuria, urgency, frequency, hematuria, flank pain, decreased urine volume, difficulty urinating and dyspareunia.  Musculoskeletal: Negative for back pain, joint swelling, arthralgias and gait problem.  Neurological: Negative for dizziness, tremors, seizures, syncope, facial asymmetry, speech difficulty, weakness, light-headedness, numbness and headaches.  Hematological: Negative for adenopathy. Does not bruise/bleed easily.  Psychiatric/Behavioral: Negative for hallucinations, behavioral problems, confusion, dysphoric mood, decreased concentration and agitation.    Objective:   Filed  Vitals:   05/28/13 1108  BP: 150/74  Pulse: 76  Temp: 98.4 F (36.9 C)  Resp: 18    Physical Exam  Constitutional: Appears well-developed and well-nourished. No distress.  HENT: Normocephalic. External right and left ear normal. Oropharynx is clear and moist.  Eyes: Conjunctivae and EOM are normal. PERRLA, no scleral icterus.  Neck: Normal ROM. Neck supple. No JVD. No tracheal deviation. No thyromegaly.  CVS: RRR, S1/S2 +, no murmurs, no gallops, no carotid bruit.  Pulmonary: Effort and breath sounds normal, no stridor, rhonchi, wheezes, rales.  Abdominal: Soft. BS +,  no distension, tenderness, rebound or guarding.  Musculoskeletal: Normal range of motion. No edema and no tenderness.  Lymphadenopathy: No lymphadenopathy noted, cervical, inguinal. Neuro: Alert. Normal reflexes, muscle tone coordination. No cranial nerve deficit. Skin: Skin is warm and dry. No rash noted. Not diaphoretic. No erythema. No pallor.  Psychiatric: Normal mood and affect. Behavior, judgment, thought content normal.   Lab Results  Component Value Date   WBC 7.2 02/08/2013   HGB 16.9 02/08/2013   HCT 49.4 02/08/2013   MCV 90.3 02/08/2013   PLT 160 02/08/2013   Lab Results  Component Value Date   CREATININE 1.08 02/08/2013   BUN 10 02/08/2013   NA 135 02/08/2013   K 4.6 02/08/2013   CL 98 02/08/2013   CO2 23 02/08/2013    Lab Results  Component Value Date   HGBA1C 12.0* 12/31/2011   Lipid Panel     Component Value Date/Time   CHOL 230* 01/01/2012 0238   TRIG 177* 01/01/2012 0238   HDL 41 01/01/2012 0238   CHOLHDL 5.6 01/01/2012 0238   VLDL 35 01/01/2012 0238   LDLCALC 154* 01/01/2012 0238       Assessment and plan:   Patient Active Problem List   Diagnosis Date Noted  . Hyperlipidemia 01/01/2012  . Hyponatremia 12/31/2011  . CKD (chronic kidney disease), stage II 12/31/2011  . Dehydration 12/31/2011  . Dizziness 12/31/2011  . Polycythemia 12/31/2011  . Pulmonary nodule 12/31/2011  . Obesity 12/31/2011  . DM  (diabetes mellitus), type 2, uncontrolled with complications 07/10/2011  . HTN (hypertension), benign 07/10/2011  . Chest pain 07/09/2011  . Shortness of breath 07/09/2011       Insulin-dependent diabetes, A1c of 12 Patient is noncompliant with the dose of his insulin He has been instructed to fill his prescriptions at the clinic pharmacy it is more affordable Increase insulin 70 /30-50 units twice a day He is encouraged to check her sugar 3 times a day He will need close followup and will be seen back in a month  Ophthalmology referral has been provided, patient instructed not to drive until he sees an ophthalmologist   Chest pain Obtain a 2-D echo, start the patient on aspirin, metoprolol, urgent cardiology referral for elective scan Myoview stress test has been provided   Hypertension start the patient on metoprolol  The patient was given clear instructions to go to ER or return to medical center if symptoms don't improve, worsen or new problems develop.  The patient verbalized understanding. The patient was told to call to get any lab results if not heard anything in the next week.

## 2013-05-28 NOTE — Patient Instructions (Signed)
Patient instructed not to drive until he sees an eye doctor

## 2013-05-29 LAB — TSH: TSH: 2.186 u[IU]/mL (ref 0.350–4.500)

## 2013-06-02 ENCOUNTER — Encounter: Payer: Self-pay | Admitting: Internal Medicine

## 2013-06-02 ENCOUNTER — Ambulatory Visit (HOSPITAL_COMMUNITY): Payer: Medicare PPO

## 2013-06-11 ENCOUNTER — Ambulatory Visit: Payer: Medicare Other | Admitting: Cardiology

## 2013-06-12 ENCOUNTER — Ambulatory Visit (HOSPITAL_COMMUNITY)
Admission: RE | Admit: 2013-06-12 | Discharge: 2013-06-12 | Disposition: A | Discharge status: Home / Self Care | Payer: Medicare PPO | Source: Ambulatory Visit | Attending: Internal Medicine | Admitting: Internal Medicine

## 2013-06-12 DIAGNOSIS — N189 Chronic kidney disease, unspecified: Secondary | ICD-10-CM | POA: Insufficient documentation

## 2013-06-12 DIAGNOSIS — E785 Hyperlipidemia, unspecified: Secondary | ICD-10-CM | POA: Insufficient documentation

## 2013-06-12 DIAGNOSIS — I129 Hypertensive chronic kidney disease with stage 1 through stage 4 chronic kidney disease, or unspecified chronic kidney disease: Secondary | ICD-10-CM | POA: Insufficient documentation

## 2013-06-12 DIAGNOSIS — Z87891 Personal history of nicotine dependence: Secondary | ICD-10-CM | POA: Insufficient documentation

## 2013-06-12 DIAGNOSIS — E1110 Type 2 diabetes mellitus with ketoacidosis without coma: Secondary | ICD-10-CM

## 2013-06-12 DIAGNOSIS — R079 Chest pain, unspecified: Secondary | ICD-10-CM | POA: Insufficient documentation

## 2013-06-12 DIAGNOSIS — E119 Type 2 diabetes mellitus without complications: Secondary | ICD-10-CM | POA: Insufficient documentation

## 2013-06-12 NOTE — Progress Notes (Signed)
*  PRELIMINARY RESULTS* Echocardiogram 2D Echocardiogram has been performed.  [name] 06/12/2013, 10:54 AM

## 2013-06-16 ENCOUNTER — Emergency Department (HOSPITAL_COMMUNITY): Payer: Medicare PPO

## 2013-06-16 ENCOUNTER — Encounter: Payer: Self-pay | Admitting: Emergency Medicine

## 2013-06-16 ENCOUNTER — Emergency Department (HOSPITAL_COMMUNITY)
Admission: EM | Admit: 2013-06-16 | Discharge: 2013-06-16 | Disposition: A | Discharge status: Home / Self Care | Payer: Medicare PPO | Attending: Emergency Medicine | Admitting: Emergency Medicine

## 2013-06-16 ENCOUNTER — Ambulatory Visit: Payer: Medicare Other

## 2013-06-16 ENCOUNTER — Encounter (HOSPITAL_COMMUNITY): Payer: Self-pay | Admitting: Emergency Medicine

## 2013-06-16 DIAGNOSIS — R109 Unspecified abdominal pain: Secondary | ICD-10-CM | POA: Insufficient documentation

## 2013-06-16 DIAGNOSIS — R110 Nausea: Secondary | ICD-10-CM | POA: Insufficient documentation

## 2013-06-16 DIAGNOSIS — E119 Type 2 diabetes mellitus without complications: Secondary | ICD-10-CM | POA: Insufficient documentation

## 2013-06-16 DIAGNOSIS — E669 Obesity, unspecified: Secondary | ICD-10-CM | POA: Insufficient documentation

## 2013-06-16 DIAGNOSIS — I10 Essential (primary) hypertension: Secondary | ICD-10-CM | POA: Insufficient documentation

## 2013-06-16 DIAGNOSIS — M129 Arthropathy, unspecified: Secondary | ICD-10-CM | POA: Insufficient documentation

## 2013-06-16 DIAGNOSIS — Z79899 Other long term (current) drug therapy: Secondary | ICD-10-CM | POA: Insufficient documentation

## 2013-06-16 DIAGNOSIS — R071 Chest pain on breathing: Secondary | ICD-10-CM | POA: Insufficient documentation

## 2013-06-16 DIAGNOSIS — Z87891 Personal history of nicotine dependence: Secondary | ICD-10-CM | POA: Insufficient documentation

## 2013-06-16 DIAGNOSIS — Z8669 Personal history of other diseases of the nervous system and sense organs: Secondary | ICD-10-CM | POA: Insufficient documentation

## 2013-06-16 DIAGNOSIS — Z8719 Personal history of other diseases of the digestive system: Secondary | ICD-10-CM | POA: Insufficient documentation

## 2013-06-16 DIAGNOSIS — R0602 Shortness of breath: Secondary | ICD-10-CM | POA: Insufficient documentation

## 2013-06-16 DIAGNOSIS — Z7982 Long term (current) use of aspirin: Secondary | ICD-10-CM | POA: Insufficient documentation

## 2013-06-16 DIAGNOSIS — R0789 Other chest pain: Secondary | ICD-10-CM

## 2013-06-16 DIAGNOSIS — Z794 Long term (current) use of insulin: Secondary | ICD-10-CM | POA: Insufficient documentation

## 2013-06-16 LAB — COMPREHENSIVE METABOLIC PANEL
AST: 19 U/L (ref 0–37)
Alkaline Phosphatase: 88 U/L (ref 39–117)
BUN: 12 mg/dL (ref 6–23)
CO2: 26 mEq/L (ref 19–32)
Chloride: 99 mEq/L (ref 96–112)
Creatinine, Ser: 0.96 mg/dL (ref 0.50–1.35)
GFR calc non Af Amer: 76 mL/min — ABNORMAL LOW (ref >90)
Total Bilirubin: 0.3 mg/dL (ref 0.3–1.2)

## 2013-06-16 LAB — CBC
HCT: 47.4 % (ref 39.0–52.0)
MCV: 91.9 fL (ref 78.0–100.0)
RBC: 5.16 MIL/uL (ref 4.22–5.81)
WBC: 7.8 10*3/uL (ref 4.0–10.5)

## 2013-06-16 LAB — POCT I-STAT TROPONIN I: Troponin i, poc: 0 ng/mL (ref 0.00–0.08)

## 2013-06-16 LAB — PRO B NATRIURETIC PEPTIDE: Pro B Natriuretic peptide (BNP): 49.3 pg/mL (ref 0–450)

## 2013-06-16 MED ORDER — DOCUSATE SODIUM 100 MG PO CAPS: 100.0000 mg | ORAL_CAPSULE | Freq: Two times a day (BID) | ORAL | Status: DC

## 2013-06-16 MED ORDER — HYDROCODONE-ACETAMINOPHEN 5-300 MG PO TABS: 5.0000 mg | ORAL_TABLET | Freq: Three times a day (TID) | ORAL | Status: DC | PRN

## 2013-06-16 MED ORDER — IBUPROFEN 600 MG PO TABS: 600.0000 mg | ORAL_TABLET | Freq: Three times a day (TID) | ORAL | Status: DC | PRN

## 2013-06-16 MED ORDER — KETOROLAC TROMETHAMINE 30 MG/ML IJ SOLN
15.0000 mg | Freq: Once | INTRAMUSCULAR | Status: AC
  Administered 2013-06-16: 15 mg via INTRAVENOUS
  Filled 2013-06-16: qty 1

## 2013-06-16 NOTE — Progress Notes (Signed)
   Subjective:    Patient ID: [name], male    DOB: 28-Jul-1929, 79 y.o.   MRN: [id number]  HPI    Review of Systems     Objective:   Physical Exam        Assessment & Plan:  Pt comes in same day with c/o 4 dys midsternal CP radiating to epigastric region. Pt was seen in ER last Wednesday for same sx's and negative work-up. States the pain worsens with lying down. Vss. Pt sent to ER [facility] via private vehicle accompanied by wife.

## 2013-06-16 NOTE — ED Notes (Signed)
Phlebotomy to bedside to attempt labs. Unsuccessful

## 2013-06-16 NOTE — ED Notes (Signed)
Dr. Elesa Massed made aware having difficult time obtaining labs. Reports will do EJ.

## 2013-06-16 NOTE — ED Notes (Addendum)
Cp and abd pain since wed went to dr today and was sent here for further tests has had some nausea no cough  Feels like pressure on chest

## 2013-06-16 NOTE — ED Provider Notes (Signed)
TIME SEEN: 11:09 AM  CHIEF COMPLAINT: Chest pain, headache  HPI: Patient is a 79 year old male with a history of diabetes, hypertension, hyperlipidemia, prior tobacco use presents the emergency department with sharp, left-sided chest pain has been constant for the past 2 weeks. He reports it radiates into his upper abdomen. He states it is worse when trying to sit up from a lying position and with movement. He also feels short of breath because it hurts to take a deep breath. His pain is not exertional. He has had some nausea but no vomiting. Denies any fevers, chills or cough. Denies a history of cardiac disease. No prior history of PE or DVT. No recent prolonged immobilization such as long flight or hospitalization, fracture, surgery, trauma. Patient was seen in the emergency department in August 2014 and was diagnosed with chest wall pain. He was seen in his primary care physician's office at [place] and wellness today was sent to the emergency department for further evaluation.  ROS: See HPI Constitutional: no fever  Eyes: no drainage  ENT: no runny nose   Cardiovascular:   chest pain  Resp: SOB  GI: no vomiting GU: no dysuria Integumentary: no rash  Allergy: no hives  Musculoskeletal: no leg swelling  Neurological: no slurred speech ROS otherwise negative  PAST MEDICAL HISTORY/PAST SURGICAL HISTORY:  Past Medical History  Diagnosis Date  . Diabetes mellitus   . Hypertension   . Obesity   . H/O hiatal hernia   . Arthritis   . Hyperlipidemia   . Neuropathy     MEDICATIONS:  Prior to Admission medications   Medication Sig Start Date End Date Taking? Authorizing Provider  aspirin EC 81 MG tablet Take 1 tablet (81 mg total) by mouth daily. 05/28/13   Richarda Overlie, MD  gabapentin (NEURONTIN) 300 MG capsule Take 1 capsule (300 mg total) by mouth 3 (three) times daily. 05/22/13   Celene Kras, MD  insulin NPH-regular (NOVOLIN 70/30) (70-30) 100 UNIT/ML injection Inject 50 Units  into the skin 2 (two) times daily with a meal. 05/28/13   Richarda Overlie, MD  metoprolol tartrate (LOPRESSOR) 25 MG tablet Take 1 tablet (25 mg total) by mouth 2 (two) times daily. 05/28/13   Richarda Overlie, MD  oxyCODONE-acetaminophen (PERCOCET/ROXICET) 5-325 MG per tablet Take 2 tablets by mouth every 4 (four) hours as needed for pain. 02/08/13   Fayrene Helper, PA-C  oxyCODONE-acetaminophen (PERCOCET/ROXICET) 5-325 MG per tablet Take 1-2 tablets by mouth every 6 (six) hours as needed. 05/22/13   Celene Kras, MD  traMADol (ULTRAM) 50 MG tablet Take 1 tablet (50 mg total) by mouth every 12 (twelve) hours as needed for moderate pain (In his feet). 05/28/13   Richarda Overlie, MD    ALLERGIES:  No Known Allergies  SOCIAL HISTORY:  History  Substance Use Topics  . Smoking status: Former Smoker    Types: Cigarettes    Quit date: 12/30/1996  . Smokeless tobacco: Never Used  . Alcohol Use: No    FAMILY HISTORY: Family History  Problem Relation Age of Onset  . Cancer Mother     EXAM: BP 138/78  Pulse 75  Temp(Src) 98.1 F (36.7 C)  Resp 16  SpO2 97% CONSTITUTIONAL: Alert and oriented and responds appropriately to questions. Well-appearing; well-nourished HEAD: Normocephalic EYES: Conjunctivae clear, PERRL ENT: normal nose; no rhinorrhea; moist mucous membranes; pharynx without lesions noted NECK: Supple, no meningismus, no LAD  CARD: RRR; S1 and S2 appreciated; no murmurs, no clicks, no  rubs, no gallops RESP: Normal chest excursion without splinting or tachypnea; breath sounds clear and equal bilaterally; no wheezes, no rhonchi, no rales, left chest wall is extremely tender to palpation without skin lesions or crepitus ABD/GI: Normal bowel sounds; non-distended; soft, non-tender, no rebound, no guarding BACK:  The back appears normal and is non-tender to palpation, there is no CVA tenderness EXT: Normal ROM in all joints; non-tender to palpation; no edema; normal capillary refill; no cyanosis     SKIN: Normal color for age and race; warm NEURO: Moves all extremities equally PSYCH: The patient's mood and manner are appropriate. Grooming and personal hygiene are appropriate.  MEDICAL DECISION MAKING: Patient here with likely chest wall pain. He is hemodynamically stable. His pain has been present for 2 weeks. His EKG shows no ischemic changes. Given his multiple risk factors for ACS, will obtain cardiac labs but suspect this is musculoskeletal in nature. Will also obtain chest x-ray and give Toradol. Patient has no risk factors for pulmonary embolus other than age.  ED PROGRESS: Patient's chest x-ray shows possible mild interstitial edema on top of his chronic fibrosis. Labs have been obtained and BNP is pending. I had to place a left EJ for IV access. When patient had to extend his neck and turn it to the left he noticed an increase in pain in his chest which is consistent with musculoskeletal causes of his pain.  2:19 PM  Pt reports his pain is gone after Toradol. His labs are unremarkable including a negative troponin and BNP of 49.3. Chest x-ray shows cardiomegaly with slight increase in diffuse interstitial coarsening secondary to fibrosis with mild superimposed edema. He has had no hypoxia or respiratory distress in the emergency department. I do not feel he needs admission for diuresis. Given strict return precautions and PCP followup information. We'll discharge home with instructions to take ibuprofen for pain. Patient and wife at bedside verbalize understanding and are comfortable plan.  EKG Interpretation    Date/Time:  Monday June 16 2013 10:51:21 EST Ventricular Rate:  77 PR Interval:  250 QRS Duration: 90 QT Interval:  388 QTC Calculation: 439 R Axis:   79 Text Interpretation:  Sinus rhythm with 1st degree A-V block Otherwise normal ECG No significant change since last tracing Confirmed by Alzada Brazee  DO, Elainna Eshleman ([id number]) on 06/16/2013 11:09:49 AM             Angiocath  insertion Performed by: Raelyn Number  Consent: Verbal consent obtained. Risks and benefits: risks, benefits and alternatives were discussed Time out: Immediately prior to procedure a "time out" was called to verify the correct patient, procedure, equipment, support staff and site/side marked as required.  Preparation: Patient was prepped and draped in the usual sterile fashion.  Vein Location: Left EJ  Gauge: 20  Normal blood return and flush without difficulty Patient tolerance: Patient tolerated the procedure well with no immediate complications.     Layla Maw Flower Franko, DO 06/16/13 1420

## 2013-06-16 NOTE — ED Notes (Signed)
Family at bedside. 

## 2013-06-18 ENCOUNTER — Ambulatory Visit: Payer: Medicare Other | Admitting: Cardiology

## 2013-06-23 ENCOUNTER — Telehealth: Payer: Self-pay | Admitting: Internal Medicine

## 2013-06-23 ENCOUNTER — Telehealth: Payer: Self-pay | Admitting: Emergency Medicine

## 2013-06-23 NOTE — Telephone Encounter (Signed)
Pt called regarding a foot pain that he is having, He would like a call back from the nurse. Please contact pt

## 2013-06-23 NOTE — Telephone Encounter (Signed)
Spoke with pt regarding foot pain. Informed pt of narcotic policy but offered him increase in Gabapentin. Refused  States he will call later if sx worsens

## 2013-06-23 NOTE — Telephone Encounter (Signed)
Spoke with pt. Told him we could increase Gabapentin dose but pt refused. States he will call back to schedule appt if sx worsens

## 2013-06-30 ENCOUNTER — Telehealth: Payer: Self-pay | Admitting: Internal Medicine

## 2013-06-30 ENCOUNTER — Telehealth: Payer: Self-pay | Admitting: *Deleted

## 2013-06-30 ENCOUNTER — Ambulatory Visit: Payer: Medicare Other | Admitting: Internal Medicine

## 2013-06-30 NOTE — Telephone Encounter (Addendum)
Patient stated that he could not afford the copay on his medication due to the medication being a Tier 4 medication on his [facility] Medicare prescription coverage.  CSW provided patient with application for Tier Exception with [employer] to see if it is possible for patient copay to be reduced.  CSW also assessed potential for support with pharmaceutical company.  The pharmaceutical company stated that the patient could apply regardless of medicare status.   CSW will refer and make appointment with PASS Coordinator.  CSW will continue to follow as needed  Beverly Sessions MSW, LCSW Duration - 60 minutes

## 2013-06-30 NOTE — Telephone Encounter (Signed)
Patient would like to be referred for a colonoscopy and to an endocrinologist. His pcp is [facility], but he is a Dr. Daleen Squibb patient. Please advise.

## 2013-07-01 ENCOUNTER — Encounter: Payer: Self-pay | Admitting: Internal Medicine

## 2013-07-01 ENCOUNTER — Ambulatory Visit: Payer: Medicare HMO | Attending: Internal Medicine | Admitting: Internal Medicine

## 2013-07-01 ENCOUNTER — Ambulatory Visit: Payer: Medicare Other | Attending: Internal Medicine

## 2013-07-01 VITALS — BP 143/83 | HR 86 | Temp 98.9°F | Resp 14 | Ht 70.0 in | Wt 270.6 lb

## 2013-07-01 DIAGNOSIS — E1142 Type 2 diabetes mellitus with diabetic polyneuropathy: Secondary | ICD-10-CM | POA: Insufficient documentation

## 2013-07-01 DIAGNOSIS — E1149 Type 2 diabetes mellitus with other diabetic neurological complication: Secondary | ICD-10-CM | POA: Insufficient documentation

## 2013-07-01 DIAGNOSIS — E119 Type 2 diabetes mellitus without complications: Secondary | ICD-10-CM

## 2013-07-01 LAB — CBC WITH DIFFERENTIAL/PLATELET
Basophils Absolute: 0.1 10*3/uL (ref 0.0–0.1)
Eosinophils Absolute: 0.3 10*3/uL (ref 0.0–0.7)
Eosinophils Relative: 5 % (ref 0–5)
HCT: 49.5 % (ref 39.0–52.0)
Lymphocytes Relative: 35 % (ref 12–46)
Lymphs Abs: 2.3 10*3/uL (ref 0.7–4.0)
MCV: 90.3 fL (ref 78.0–100.0)
Monocytes Absolute: 0.4 10*3/uL (ref 0.1–1.0)
Monocytes Relative: 6 % (ref 3–12)
RBC: 5.48 MIL/uL (ref 4.22–5.81)
RDW: 14.8 % (ref 11.5–15.5)
WBC: 6.5 10*3/uL (ref 4.0–10.5)

## 2013-07-01 MED ORDER — FUROSEMIDE 40 MG PO TABS: 40.0000 mg | ORAL_TABLET | Freq: Every day | ORAL | Status: DC

## 2013-07-01 NOTE — Progress Notes (Signed)
Pt is here for a f/u for diabetes and colonoscopy referral. A1c was completed in November.

## 2013-07-01 NOTE — Progress Notes (Signed)
Patient ID: [name], male   DOB: [birth year], 79 y.o.   MRN: [id number]   CC:  HPI: 79 year old male with history of diabetes, hypertension, dyslipidemia recently seen in the emergency department with left-sided chest pain for the past 2 weeks. He is also develop shortness of breath with exertion and some mild edema in his legs.Patient was seen in the emergency department in August 2014 and was diagnosed with chest wall pain.  EKG showed normal sinus rhythm. 2-D echo done which showed an EF of 55-60% with grade 1 diastolic dysfunction  Patient had a stress test on 7/13 which was normal. Last BNP was 50   No Known Allergies Past Medical History  Diagnosis Date  . Diabetes mellitus   . Hypertension   . Obesity   . H/O hiatal hernia   . Arthritis   . Hyperlipidemia   . Neuropathy    Current Outpatient Prescriptions on File Prior to Visit  Medication Sig Dispense Refill  . aspirin EC 81 MG tablet Take 1 tablet (81 mg total) by mouth daily.  90 tablet  2  . Hydrocodone-Acetaminophen 5-300 MG TABS Take 5 mg by mouth every 8 (eight) hours as needed (for pain uncontrolled with ibuprofen).  15 each  0  . insulin NPH-regular (NOVOLIN 70/30) (70-30) 100 UNIT/ML injection Inject 50 Units into the skin 2 (two) times daily with a meal.  10 mL  10  . traMADol (ULTRAM) 50 MG tablet Take 1 tablet (50 mg total) by mouth every 12 (twelve) hours as needed for moderate pain (In his feet).  60 tablet  0  . docusate sodium (COLACE) 100 MG capsule Take 1 capsule (100 mg total) by mouth every 12 (twelve) hours.  60 capsule  0  . gabapentin (NEURONTIN) 300 MG capsule Take 1 capsule (300 mg total) by mouth 3 (three) times daily.  90 capsule  0  . metoprolol tartrate (LOPRESSOR) 25 MG tablet Take 1 tablet (25 mg total) by mouth 2 (two) times daily.  180 tablet  3  . oxyCODONE-acetaminophen (PERCOCET/ROXICET) 5-325 MG per tablet Take 2 tablets by mouth every 4 (four) hours as needed for pain.  10 tablet  0   . [DISCONTINUED] hydrALAZINE (APRESOLINE) 25 MG tablet Take 1 tablet (25 mg total) by mouth 3 (three) times daily.  30 tablet  0   No current facility-administered medications on file prior to visit.   Family History  Problem Relation Age of Onset  . Cancer Mother    History   Social History  . Marital Status: Widowed    Spouse Name: N/A    Number of Children: N/A  . Years of Education: N/A   Occupational History  . Not on file.   Social History Main Topics  . Smoking status: Former Smoker    Types: Cigarettes    Quit date: 12/30/1996  . Smokeless tobacco: Never Used  . Alcohol Use: No  . Drug Use: No  . Sexual Activity: Not on file   Other Topics Concern  . Not on file   Social History Narrative  . No narrative on file    Review of Systems  Constitutional: As in history of present illness  HENT: Negative for ear pain, nosebleeds, congestion, facial swelling, rhinorrhea, neck pain, neck stiffness and ear discharge.   Eyes: Negative for pain, discharge, redness, itching and visual disturbance.  Respiratory: Negative for cough, choking, chest tightness, as in history of present illness  Cardiovascular: As in history of present  illness Gastrointestinal: Negative for abdominal distention.  Genitourinary: Negative for dysuria, urgency, frequency, hematuria, flank pain, decreased urine volume, difficulty urinating and dyspareunia.  Musculoskeletal: Negative for back pain, joint swelling, arthralgias and gait problem.  Neurological: Negative for dizziness, tremors, seizures, syncope, facial asymmetry, speech difficulty, weakness, light-headedness, numbness and headaches.  Hematological: Negative for adenopathy. Does not bruise/bleed easily.  Psychiatric/Behavioral: Negative for hallucinations, behavioral problems, confusion, dysphoric mood, decreased concentration and agitation.    Objective:   Filed Vitals:   07/01/13 0907  BP: 143/83  Pulse: 86  Temp: 98.9 F (37.2  C)  Resp: 14    Physical Exam  Constitutional: Appears well-developed and well-nourished. No distress.  HENT: Normocephalic. External right and left ear normal. Oropharynx is clear and moist.  Eyes: Conjunctivae and EOM are normal. PERRLA, no scleral icterus.  Neck: Normal ROM. Neck supple. No JVD. No tracheal deviation. No thyromegaly.  CVS: RRR, S1/S2 +, no murmurs, no gallops, no carotid bruit.  Pulmonary: Effort and breath sounds normal, no stridor, rhonchi, wheezes, rales.  Abdominal: Soft. BS +,  no distension, tenderness, rebound or guarding.  Musculoskeletal: Normal range of motion. No edema and no tenderness.  2+ dependent edema  Lymphadenopathy: No lymphadenopathy noted, cervical, inguinal. Neuro: Alert. Normal reflexes, muscle tone coordination. No cranial nerve deficit. Skin: Skin is warm and dry. No rash noted. Not diaphoretic. No erythema. No pallor.  Psychiatric: Normal mood and affect. Behavior, judgment, thought content normal.   Lab Results  Component Value Date   WBC 7.8 06/16/2013   HGB 16.4 06/16/2013   HCT 47.4 06/16/2013   MCV 91.9 06/16/2013   PLT 166 06/16/2013   Lab Results  Component Value Date   CREATININE 0.96 06/16/2013   BUN 12 06/16/2013   NA 135 06/16/2013   K 4.6 06/16/2013   CL 99 06/16/2013   CO2 26 06/16/2013    Lab Results  Component Value Date   HGBA1C 8.2 05/28/2013   Lipid Panel     Component Value Date/Time   CHOL 219* 05/28/2013 1127   TRIG 99 05/28/2013 1127   HDL 42 05/28/2013 1127   CHOLHDL 5.2 05/28/2013 1127   VLDL 20 05/28/2013 1127   LDLCALC 157* 05/28/2013 1127       Assessment and plan:   Patient Active Problem List   Diagnosis Date Noted  . Hyperlipidemia 01/01/2012  . Hyponatremia 12/31/2011  . CKD (chronic kidney disease), stage II 12/31/2011  . Dehydration 12/31/2011  . Dizziness 12/31/2011  . Polycythemia 12/31/2011  . Pulmonary nodule 12/31/2011  . Obesity 12/31/2011  . DM (diabetes mellitus),  type 2, uncontrolled with complications 07/10/2011  . HTN (hypertension), benign 07/10/2011  . Chest pain 07/09/2011  . Shortness of breath 07/09/2011   Diabetes Will check hemoglobin A1c with his next appointment Last hemoglobin A1c was reassuring The patient needs to continue with the same dose of insulin   Diabetic neuropathy Patient is already on gabapentin  Dependent edema Patient has chronic diastolic heart failure and is being started on Lasix He will need a BMP on his next visit   Fatigue We'll obtain a chest x-ray, most likely secondary to dependent edema and diastolic heart failure Will check CBC, TSH, vitamin D. Levels  Followup in one week       The patient was given clear instructions to go to ER or return to medical center if symptoms don't improve, worsen or new problems develop. The patient verbalized understanding. The patient was told to call to get  any lab results if not heard anything in the next week.

## 2013-07-02 ENCOUNTER — Ambulatory Visit (HOSPITAL_COMMUNITY)
Admission: RE | Admit: 2013-07-02 | Discharge: 2013-07-02 | Disposition: A | Discharge status: Home / Self Care | Payer: Medicare PPO | Source: Ambulatory Visit | Attending: Internal Medicine | Admitting: Internal Medicine

## 2013-07-02 ENCOUNTER — Telehealth: Payer: Self-pay | Admitting: *Deleted

## 2013-07-02 ENCOUNTER — Other Ambulatory Visit: Payer: Self-pay | Admitting: Internal Medicine

## 2013-07-02 DIAGNOSIS — I517 Cardiomegaly: Secondary | ICD-10-CM | POA: Insufficient documentation

## 2013-07-02 DIAGNOSIS — E119 Type 2 diabetes mellitus without complications: Secondary | ICD-10-CM

## 2013-07-02 DIAGNOSIS — J8410 Pulmonary fibrosis, unspecified: Secondary | ICD-10-CM | POA: Insufficient documentation

## 2013-07-02 DIAGNOSIS — I771 Stricture of artery: Secondary | ICD-10-CM | POA: Insufficient documentation

## 2013-07-02 MED ORDER — PREGABALIN 50 MG PO CAPS: 50.0000 mg | ORAL_CAPSULE | Freq: Every day | ORAL | Status: DC

## 2013-07-02 MED ORDER — VITAMIN D (ERGOCALCIFEROL) 1.25 MG (50000 UNIT) PO CAPS: 50000.0000 [IU] | ORAL_CAPSULE | ORAL | Status: DC

## 2013-07-02 NOTE — Telephone Encounter (Signed)
Message copied by Shavar Gorka, [place] R on Wed Jul 02, 2013  9:54 AM ------      Message from: Susie Cassette MD, [facility]      Created: Wed Jul 02, 2013  9:48 AM       Notify patient of the patient's vitamin D level is 12. Call in a prescription for vitamin D 50,000 units weekly, 10 tablets with 2 refills ------

## 2013-07-02 NOTE — Telephone Encounter (Signed)
Pt stated that he has already taken 7 pills a day since he got his prescription. Advised pt to follow the instructions on the bottle as prescribed.

## 2013-07-08 ENCOUNTER — Telehealth: Payer: Self-pay | Admitting: Emergency Medicine

## 2013-07-08 NOTE — Telephone Encounter (Signed)
Pt friend called in requesting medication for sleep. States she spoke with you regarding problem at last visit. Please f/u

## 2013-07-22 ENCOUNTER — Telehealth: Payer: Self-pay | Admitting: *Deleted

## 2013-07-22 MED ORDER — FUROSEMIDE 40 MG PO TABS: 40.0000 mg | ORAL_TABLET | Freq: Every day | ORAL | Status: DC

## 2013-07-22 NOTE — Telephone Encounter (Signed)
Message copied by Drinda Belgard, [place] R on Tue Jul 22, 2013 12:41 PM ------      Message from: Allyson Sabal MD, [facility]      Created: Mon Jul 21, 2013  3:06 PM       Notify patient of the chest x-ray shows mild fluid/edema in his lungs. Continue taking Lasix ------

## 2013-07-22 NOTE — Telephone Encounter (Signed)
Refilled Lasix for pt. Has been off medication for a few weeks. Medication sent to our pharmacy. Pt will pick up meds today.

## 2013-07-23 NOTE — Telephone Encounter (Signed)
Referral is done by her primary care physician so patient can see Dr. Amalia Hailey for referral to endocrinologist and colonoscopy

## 2013-07-26 ENCOUNTER — Encounter (HOSPITAL_COMMUNITY): Payer: Self-pay | Admitting: Emergency Medicine

## 2013-07-26 ENCOUNTER — Emergency Department (HOSPITAL_COMMUNITY)
Admission: EM | Admit: 2013-07-26 | Discharge: 2013-07-26 | Disposition: A | Discharge status: Home / Self Care | Payer: Medicare PPO | Attending: Emergency Medicine | Admitting: Emergency Medicine

## 2013-07-26 DIAGNOSIS — Z79899 Other long term (current) drug therapy: Secondary | ICD-10-CM | POA: Insufficient documentation

## 2013-07-26 DIAGNOSIS — Z7982 Long term (current) use of aspirin: Secondary | ICD-10-CM | POA: Insufficient documentation

## 2013-07-26 DIAGNOSIS — I10 Essential (primary) hypertension: Secondary | ICD-10-CM | POA: Insufficient documentation

## 2013-07-26 DIAGNOSIS — Y929 Unspecified place or not applicable: Secondary | ICD-10-CM | POA: Insufficient documentation

## 2013-07-26 DIAGNOSIS — Y939 Activity, unspecified: Secondary | ICD-10-CM | POA: Insufficient documentation

## 2013-07-26 DIAGNOSIS — Z794 Long term (current) use of insulin: Secondary | ICD-10-CM | POA: Insufficient documentation

## 2013-07-26 DIAGNOSIS — Z87891 Personal history of nicotine dependence: Secondary | ICD-10-CM | POA: Insufficient documentation

## 2013-07-26 DIAGNOSIS — S0500XA Injury of conjunctiva and corneal abrasion without foreign body, unspecified eye, initial encounter: Secondary | ICD-10-CM

## 2013-07-26 DIAGNOSIS — E669 Obesity, unspecified: Secondary | ICD-10-CM | POA: Insufficient documentation

## 2013-07-26 DIAGNOSIS — Z8719 Personal history of other diseases of the digestive system: Secondary | ICD-10-CM | POA: Insufficient documentation

## 2013-07-26 DIAGNOSIS — X58XXXA Exposure to other specified factors, initial encounter: Secondary | ICD-10-CM | POA: Insufficient documentation

## 2013-07-26 DIAGNOSIS — E119 Type 2 diabetes mellitus without complications: Secondary | ICD-10-CM | POA: Insufficient documentation

## 2013-07-26 DIAGNOSIS — S058X9A Other injuries of unspecified eye and orbit, initial encounter: Secondary | ICD-10-CM | POA: Insufficient documentation

## 2013-07-26 DIAGNOSIS — G589 Mononeuropathy, unspecified: Secondary | ICD-10-CM | POA: Insufficient documentation

## 2013-07-26 DIAGNOSIS — M129 Arthropathy, unspecified: Secondary | ICD-10-CM | POA: Insufficient documentation

## 2013-07-26 MED ORDER — FLUORESCEIN SODIUM 1 MG OP STRP
1.0000 | ORAL_STRIP | Freq: Once | OPHTHALMIC | Status: AC
  Administered 2013-07-26: 1 via OPHTHALMIC
  Filled 2013-07-26: qty 1

## 2013-07-26 MED ORDER — CIPROFLOXACIN HCL 0.3 % OP SOLN: 1.0000 [drp] | Freq: Four times a day (QID) | OPHTHALMIC | Status: DC

## 2013-07-26 MED ORDER — TETRACAINE HCL 0.5 % OP SOLN
2.0000 [drp] | Freq: Once | OPHTHALMIC | Status: AC
  Administered 2013-07-26: 2 [drp] via OPHTHALMIC
  Filled 2013-07-26: qty 2

## 2013-07-26 MED ORDER — HYDROCODONE-ACETAMINOPHEN 5-325 MG PO TABS: 1.0000 | ORAL_TABLET | ORAL | Status: DC | PRN

## 2013-07-26 NOTE — Discharge Instructions (Signed)
Read the information below.  Use the prescribed medication as directed.  Please discuss all new medications with your pharmacist.  Do not take additional tylenol while taking the prescribed pain medication to avoid overdose.  You may return to the Emergency Department at any time for worsening condition or any new symptoms that concern you.  If you develop uncontrolled pain, change in vision, fevers, or abnormal discharge from your eye, see the eye specialist or return to the ER immediately for a recheck.    Corneal Abrasion The cornea is the clear covering at the front and center of the eye. When looking at the colored portion of the eye (iris), you are looking through the cornea. This very thin tissue is made up of many layers. The surface layer is a single layer of cells (corneal epithelium) and is one of the most sensitive tissues in the body. If a scratch or injury causes the corneal epithelium to come off, it is called a corneal abrasion. If the injury extends to the tissues below the epithelium, the condition is called a corneal ulcer. CAUSES   Scratches.  Trauma.  Foreign body in the eye. Some people have recurrences of abrasions in the area of the original injury even after it has healed (recurrent erosion syndrome). Recurrent erosion syndrome generally improves and goes away with time. SYMPTOMS   Eye pain.  Difficulty or inability to keep the injured eye open.  The eye becomes very sensitive to light.  Recurrent erosions tend to happen suddenly, first thing in the morning, usually after waking up and opening the eye. DIAGNOSIS  Your health care provider can diagnose a corneal abrasion during an eye exam. Dye is usually placed in the eye using a drop or a small paper strip moistened by your tears. When the eye is examined with a special light, the abrasion shows up clearly because of the dye. TREATMENT   Small abrasions may be treated with antibiotic drops or ointment  alone.  Usually a pressure patch is specially applied. Pressure patches prevent the eye from blinking, allowing the corneal epithelium to heal. A pressure patch also reduces the amount of pain present in the eye during healing. Most corneal abrasions heal within 2 3 days with no effect on vision. If the abrasion becomes infected and spreads to the deeper tissues of the cornea, a corneal ulcer can result. This is serious because it can cause corneal scarring. Corneal scars interfere with light passing through the cornea and cause a loss of vision in the involved eye. HOME CARE INSTRUCTIONS  Use medicine or ointment as directed. Only take over-the-counter or prescription medicines for pain, discomfort, or fever as directed by your health care provider.  Do not drive or operate machinery while your eye is patched. Your ability to judge distances is impaired.  If your health care provider has given you a follow-up appointment, it is very important to keep that appointment. Not keeping the appointment could result in a severe eye infection or permanent loss of vision. If there is any problem keeping the appointment, let your health care provider know. SEEK MEDICAL CARE IF:   You have pain, light sensitivity, and a scratchy feeling in one eye or both eyes.  Your pressure patch keeps loosening up, and you can blink your eye under the patch after treatment.  Any kind of discharge develops from the eye after treatment or if the lids stick together in the morning.  You have the same symptoms in the  morning as you did with the original abrasion days, weeks, or months after the abrasion healed. MAKE SURE YOU:   Understand these instructions.  Will watch your condition.  Will get help right away if you are not doing well or get worse. Document Released: 06/16/2000 Document Revised: 04/09/2013 Document Reviewed: 02/24/2013 [facility] Patient Information 2014 [place].

## 2013-07-26 NOTE — ED Provider Notes (Signed)
CSN: [id number]     Arrival date & time 07/26/13  1253 History   First MD Initiated Contact with Patient 07/26/13 1335    This chart was scribed for Clayton Bibles PA-C, a non-physician practitioner working with Leota Jacobsen, MD by Denice Bors, ED Scribe. This patient was seen in room TR04C/TR04C and the patient's care was started at 1:40 PM     Chief Complaint  Patient presents with  . Eye Pain   (Consider location/radiation/quality/duration/timing/severity/associated sxs/prior Treatment) The history is provided by the patient. No language interpreter was used.   HPI Comments: [name] is a 80 y.o. male who presents to the Emergency Department complaining of constant moderate left eye pain onset today. Denies blurry vision, headache, vomiting.  Notes associated eye redness, and clear eye discharge. Denies any aggravating or alleviating factors. Denies associated fever, recent trauma, and known foreign body.   Past Medical History  Diagnosis Date  . Diabetes mellitus   . Hypertension   . Obesity   . H/O hiatal hernia   . Arthritis   . Hyperlipidemia   . Neuropathy    Past Surgical History  Procedure Laterality Date  . Abdominal surgery      pt has abdominal scarring and states he had sx but not sure what kind  . Back surgery    . Cholecystectomy     Family History  Problem Relation Age of Onset  . Cancer Mother    History  Substance Use Topics  . Smoking status: Former Smoker    Types: Cigarettes    Quit date: 12/30/1996  . Smokeless tobacco: Never Used  . Alcohol Use: No    Review of Systems  Constitutional: Negative for fever.  Eyes: Positive for pain, discharge and redness. Negative for photophobia, itching and visual disturbance.  Gastrointestinal: Negative for vomiting.  Skin: Negative for color change.  Neurological: Negative for headaches.    Allergies  Review of patient's allergies indicates no known allergies.  Home Medications    Current Outpatient Rx  Name  Route  Sig  Dispense  Refill  . aspirin EC 81 MG tablet   Oral   Take 1 tablet (81 mg total) by mouth daily.   90 tablet   2   . docusate sodium (COLACE) 100 MG capsule   Oral   Take 1 capsule (100 mg total) by mouth every 12 (twelve) hours.   60 capsule   0   . furosemide (LASIX) 40 MG tablet   Oral   Take 1 tablet (40 mg total) by mouth daily.   30 tablet   3   . gabapentin (NEURONTIN) 300 MG capsule   Oral   Take 1 capsule (300 mg total) by mouth 3 (three) times daily.   90 capsule   0   . Hydrocodone-Acetaminophen 5-300 MG TABS   Oral   Take 5 mg by mouth every 8 (eight) hours as needed (for pain uncontrolled with ibuprofen).   15 each   0   . insulin NPH-regular (NOVOLIN 70/30) (70-30) 100 UNIT/ML injection   Subcutaneous   Inject 50 Units into the skin 2 (two) times daily with a meal.   10 mL   10   . metoprolol tartrate (LOPRESSOR) 25 MG tablet   Oral   Take 1 tablet (25 mg total) by mouth 2 (two) times daily.   180 tablet   3   . oxyCODONE-acetaminophen (PERCOCET/ROXICET) 5-325 MG per tablet   Oral   Take  2 tablets by mouth every 4 (four) hours as needed for pain.   10 tablet   0   . pregabalin (LYRICA) 50 MG capsule   Oral   Take 1 capsule (50 mg total) by mouth daily.   90 capsule   4   . traMADol (ULTRAM) 50 MG tablet   Oral   Take 1 tablet (50 mg total) by mouth every 12 (twelve) hours as needed for moderate pain (In his feet).   60 tablet   0   . Vitamin D, Ergocalciferol, (DRISDOL) 50000 UNITS CAPS capsule   Oral   Take 1 capsule (50,000 Units total) by mouth every 7 (seven) days.   10 capsule   2    BP 132/85  Pulse 98  Temp(Src) 98.4 F (36.9 C) (Oral)  Resp 22  SpO2 97% Physical Exam  Nursing note and vitals reviewed. Constitutional: He appears well-developed and well-nourished. No distress.  HENT:  Head: Normocephalic and atraumatic.  Eyes: EOM and lids are normal. Pupils are equal,  round, and reactive to light. Lids are everted and swept, no foreign bodies found. Left eye exhibits no discharge, no exudate and no hordeolum. No foreign body present in the left eye. Left conjunctiva is injected. Left conjunctiva has no hemorrhage. No scleral icterus.  Slit lamp exam:      The left eye shows corneal abrasion.    No erythema and no edema or tenderness of left lid.   Neck: Neck supple.  Pulmonary/Chest: Effort normal.  Neurological: He is alert.  Skin: He is not diaphoretic.  Left eye pressure is 15.  * Patient notes that pain completely resolved with tetracaine drops.   ED Course  Procedures  COORDINATION OF CARE:  Nursing notes reviewed. Vital signs reviewed. Initial pt interview and examination performed.   1:41 PM-Discussed work up plan with pt at bedside, which includes visual acuity, fluorescein eye staining. Pt agrees with plan.   Treatment plan initiated:Medications - No data to display   Initial diagnostic testing ordered.     Labs Review Labs Reviewed - No data to display Imaging Review No results found.  EKG Interpretation   None      Dr Zenia Resides made aware of the patient.    MDM   1. Corneal abrasion    Patient presents with left eye pain that he woke up with this morning. Eyes injected with clear tearing. No known trauma or foreign body. No foreign body on exam. Patient does have corneal abrasion at 6:00 over the iris. The pain is completely resolved with tetracaine ophthalmic drops. Pressure is normal tested at 15. Patient discharged home with ciprofloxacin ophthalmic drops and Vicodin. Ophthalmology followup.  Discussed result, findings, treatment, and follow up  with patient.  Pt given return precautions.  Pt verbalizes understanding and agrees with plan.      I personally performed the services described in this documentation, which was scribed in my presence. The recorded information has been reviewed and is accurate.    [place],  PA-C 07/26/13 [phone]

## 2013-07-26 NOTE — ED Notes (Signed)
Pt. Having pain under his lt. Eye lid.  Denies any blurred vision or headache.  Pt. Just got new eye glasses.  Lt. Eye is red and tearing.

## 2013-07-27 NOTE — ED Provider Notes (Signed)
Medical screening examination/treatment/procedure(s) were performed by non-physician practitioner and as supervising physician I was immediately available for consultation/collaboration.   Subrena Devereux T Sherrell Weir, MD 07/27/13 1113 

## 2013-08-04 ENCOUNTER — Telehealth: Payer: Self-pay | Admitting: Internal Medicine

## 2013-08-04 DIAGNOSIS — Z0000 Encounter for general adult medical examination without abnormal findings: Secondary | ICD-10-CM

## 2013-08-04 NOTE — Telephone Encounter (Signed)
Pt came in today to schedule an appt. For a Dental referral; pt says that it is an urgent matter; pt is scheduled to come in on 09/11/2013 but was also put on the waiting list in case of any earlier appts

## 2013-08-11 ENCOUNTER — Emergency Department (HOSPITAL_COMMUNITY)
Admission: EM | Admit: 2013-08-11 | Discharge: 2013-08-11 | Disposition: A | Discharge status: Home / Self Care | Payer: Medicare PPO | Attending: Emergency Medicine | Admitting: Emergency Medicine

## 2013-08-11 ENCOUNTER — Encounter (HOSPITAL_COMMUNITY): Payer: Self-pay | Admitting: Emergency Medicine

## 2013-08-11 ENCOUNTER — Emergency Department (HOSPITAL_COMMUNITY): Payer: Medicare PPO

## 2013-08-11 DIAGNOSIS — I10 Essential (primary) hypertension: Secondary | ICD-10-CM | POA: Insufficient documentation

## 2013-08-11 DIAGNOSIS — R519 Headache, unspecified: Secondary | ICD-10-CM

## 2013-08-11 DIAGNOSIS — Z8719 Personal history of other diseases of the digestive system: Secondary | ICD-10-CM | POA: Insufficient documentation

## 2013-08-11 DIAGNOSIS — R0789 Other chest pain: Secondary | ICD-10-CM | POA: Insufficient documentation

## 2013-08-11 DIAGNOSIS — R079 Chest pain, unspecified: Secondary | ICD-10-CM

## 2013-08-11 DIAGNOSIS — Z792 Long term (current) use of antibiotics: Secondary | ICD-10-CM | POA: Insufficient documentation

## 2013-08-11 DIAGNOSIS — Z794 Long term (current) use of insulin: Secondary | ICD-10-CM | POA: Insufficient documentation

## 2013-08-11 DIAGNOSIS — G589 Mononeuropathy, unspecified: Secondary | ICD-10-CM | POA: Insufficient documentation

## 2013-08-11 DIAGNOSIS — R42 Dizziness and giddiness: Secondary | ICD-10-CM | POA: Insufficient documentation

## 2013-08-11 DIAGNOSIS — R51 Headache: Secondary | ICD-10-CM | POA: Insufficient documentation

## 2013-08-11 DIAGNOSIS — G8929 Other chronic pain: Secondary | ICD-10-CM | POA: Insufficient documentation

## 2013-08-11 DIAGNOSIS — E860 Dehydration: Secondary | ICD-10-CM | POA: Insufficient documentation

## 2013-08-11 DIAGNOSIS — I959 Hypotension, unspecified: Secondary | ICD-10-CM | POA: Insufficient documentation

## 2013-08-11 DIAGNOSIS — E669 Obesity, unspecified: Secondary | ICD-10-CM | POA: Insufficient documentation

## 2013-08-11 DIAGNOSIS — Z7982 Long term (current) use of aspirin: Secondary | ICD-10-CM | POA: Insufficient documentation

## 2013-08-11 DIAGNOSIS — R1033 Periumbilical pain: Secondary | ICD-10-CM | POA: Insufficient documentation

## 2013-08-11 DIAGNOSIS — R319 Hematuria, unspecified: Secondary | ICD-10-CM

## 2013-08-11 DIAGNOSIS — E119 Type 2 diabetes mellitus without complications: Secondary | ICD-10-CM | POA: Insufficient documentation

## 2013-08-11 DIAGNOSIS — M129 Arthropathy, unspecified: Secondary | ICD-10-CM | POA: Insufficient documentation

## 2013-08-11 DIAGNOSIS — Z87891 Personal history of nicotine dependence: Secondary | ICD-10-CM | POA: Insufficient documentation

## 2013-08-11 LAB — COMPREHENSIVE METABOLIC PANEL
ALT: 19 U/L (ref 0–53)
AST: 21 U/L (ref 0–37)
Albumin: 3.1 g/dL — ABNORMAL LOW (ref 3.5–5.2)
Alkaline Phosphatase: 86 U/L (ref 39–117)
BILIRUBIN TOTAL: 0.3 mg/dL (ref 0.3–1.2)
BUN: 17 mg/dL (ref 6–23)
CHLORIDE: 96 meq/L (ref 96–112)
CO2: 25 meq/L (ref 19–32)
Calcium: 8.9 mg/dL (ref 8.4–10.5)
Creatinine, Ser: 1.32 mg/dL (ref 0.50–1.35)
GFR calc Af Amer: 57 mL/min — ABNORMAL LOW (ref >90)
GFR, EST NON AFRICAN AMERICAN: 49 mL/min — AB (ref >90)
Glucose, Bld: 299 mg/dL — ABNORMAL HIGH (ref 70–99)
POTASSIUM: 4.5 meq/L (ref 3.7–5.3)
SODIUM: 137 meq/L (ref 137–147)
Total Protein: 7.7 g/dL (ref 6.0–8.3)

## 2013-08-11 LAB — URINALYSIS, ROUTINE W REFLEX MICROSCOPIC
Glucose, UA: 500 mg/dL — AB
Ketones, ur: 15 mg/dL — AB
Leukocytes, UA: NEGATIVE
NITRITE: NEGATIVE
Protein, ur: 100 mg/dL — AB
Specific Gravity, Urine: 1.026 (ref 1.005–1.030)
UROBILINOGEN UA: 1 mg/dL (ref 0.0–1.0)
pH: 5 (ref 5.0–8.0)

## 2013-08-11 LAB — POCT I-STAT, CHEM 8
BUN: 19 mg/dL (ref 6–23)
Calcium, Ion: 1.1 mmol/L — ABNORMAL LOW (ref 1.13–1.30)
Chloride: 98 mEq/L (ref 96–112)
Creatinine, Ser: 1.3 mg/dL (ref 0.50–1.35)
GLUCOSE: 311 mg/dL — AB (ref 70–99)
HCT: 56 % — ABNORMAL HIGH (ref 39.0–52.0)
HEMOGLOBIN: 19 g/dL — AB (ref 13.0–17.0)
POTASSIUM: 4.3 meq/L (ref 3.7–5.3)
Sodium: 138 mEq/L (ref 137–147)
TCO2: 27 mmol/L (ref 0–100)

## 2013-08-11 LAB — CBC WITH DIFFERENTIAL/PLATELET
BASOS PCT: 0 % (ref 0–1)
Basophils Absolute: 0 10*3/uL (ref 0.0–0.1)
Eosinophils Absolute: 0.1 10*3/uL (ref 0.0–0.7)
Eosinophils Relative: 1 % (ref 0–5)
HCT: 51.4 % (ref 39.0–52.0)
Hemoglobin: 18.1 g/dL — ABNORMAL HIGH (ref 13.0–17.0)
Lymphocytes Relative: 21 % (ref 12–46)
Lymphs Abs: 1.9 10*3/uL (ref 0.7–4.0)
MCH: 31.8 pg (ref 26.0–34.0)
MCHC: 35.2 g/dL (ref 30.0–36.0)
MCV: 90.3 fL (ref 78.0–100.0)
MONO ABS: 0.6 10*3/uL (ref 0.1–1.0)
Monocytes Relative: 7 % (ref 3–12)
NEUTROS ABS: 6.6 10*3/uL (ref 1.7–7.7)
Neutrophils Relative %: 71 % (ref 43–77)
Platelets: 188 10*3/uL (ref 150–400)
RBC: 5.69 MIL/uL (ref 4.22–5.81)
RDW: 14.4 % (ref 11.5–15.5)
WBC: 9.2 10*3/uL (ref 4.0–10.5)

## 2013-08-11 LAB — POCT I-STAT TROPONIN I: Troponin i, poc: 0 ng/mL (ref 0.00–0.08)

## 2013-08-11 LAB — URINE MICROSCOPIC-ADD ON

## 2013-08-11 MED ORDER — SODIUM CHLORIDE 0.9 % IV SOLN
INTRAVENOUS | Status: DC
Start: 2013-08-11 — End: 2013-08-11

## 2013-08-11 MED ORDER — IOHEXOL 300 MG/ML  SOLN
100.0000 mL | Freq: Once | INTRAMUSCULAR | Status: AC | PRN
  Administered 2013-08-11: 100 mL via INTRAVENOUS

## 2013-08-11 MED ORDER — IOHEXOL 300 MG/ML  SOLN
25.0000 mL | INTRAMUSCULAR | Status: AC
  Administered 2013-08-11: 25 mL via ORAL

## 2013-08-11 MED ORDER — SODIUM CHLORIDE 0.9 % IV BOLUS (SEPSIS)
1000.0000 mL | Freq: Once | INTRAVENOUS | Status: AC
  Administered 2013-08-11: 1000 mL via INTRAVENOUS

## 2013-08-11 NOTE — ED Notes (Signed)
Pt in NAD, able to speak full sentences, skin warm and dry. Pt is poor historian. sts he had a headache last night and started having urinary frequency in the evening. Pt has not urinated since. Pt was also c/o lower abdominal pain last night that has now migrated to upper left abdomen. Pt has no tenderness, denies n/v/d. Pt hypotensive. Initially, radial pulses 2+. Pt. mentating appropriately and joking with staff.

## 2013-08-11 NOTE — ED Notes (Signed)
Rn reminding patient to drink contrast and put fluids through a pump to increase flow.

## 2013-08-11 NOTE — ED Notes (Signed)
Pt difficult IV stick. Dr. Leonides Schanz spoke to pt. About EJ access. Pt denies at this time. 22G IV in hand achieved at this time- will give bolus and re-assess. Saa phlebotomy able to get basic blood work and Technical brewer. Will reasses for cultures. Dr. Leonides Schanz made aware.

## 2013-08-11 NOTE — Discharge Instructions (Signed)
Chest Pain (Nonspecific) °It is often hard to give a specific diagnosis for the cause of chest pain. There is always a chance that your pain could be related to something serious, such as a heart attack or a blood clot in the lungs. You need to follow up with your caregiver for further evaluation. °CAUSES  °· Heartburn. °· Pneumonia or bronchitis. °· Anxiety or stress. °· Inflammation around your heart (pericarditis) or lung (pleuritis or pleurisy). °· A blood clot in the lung. °· A collapsed lung (pneumothorax). It can develop suddenly on its own (spontaneous pneumothorax) or from injury (trauma) to the chest. °· Shingles infection (herpes zoster virus). °The chest wall is composed of bones, muscles, and cartilage. Any of these can be the source of the pain. °· The bones can be bruised by injury. °· The muscles or cartilage can be strained by coughing or overwork. °· The cartilage can be affected by inflammation and become sore (costochondritis). °DIAGNOSIS  °Lab tests or other studies, such as X-rays, electrocardiography, stress testing, or cardiac imaging, may be needed to find the cause of your pain.  °TREATMENT  °· Treatment depends on what may be causing your chest pain. Treatment may include: °· Acid blockers for heartburn. °· Anti-inflammatory medicine. °· Pain medicine for inflammatory conditions. °· Antibiotics if an infection is present. °· You may be advised to change lifestyle habits. This includes stopping smoking and avoiding alcohol, caffeine, and chocolate. °· You may be advised to keep your head raised (elevated) when sleeping. This reduces the chance of acid going backward from your stomach into your esophagus. °· Most of the time, nonspecific chest pain will improve within 2 to 3 days with rest and mild pain medicine. °HOME CARE INSTRUCTIONS  °· If antibiotics were prescribed, take your antibiotics as directed. Finish them even if you start to feel better. °· For the next few days, avoid physical  activities that bring on chest pain. Continue physical activities as directed. °· Do not smoke. °· Avoid drinking alcohol. °· Only take over-the-counter or prescription medicine for pain, discomfort, or fever as directed by your caregiver. °· Follow your caregiver's suggestions for further testing if your chest pain does not go away. °· Keep any follow-up appointments you made. If you do not go to an appointment, you could develop lasting (chronic) problems with pain. If there is any problem keeping an appointment, you must call to reschedule. °SEEK MEDICAL CARE IF:  °· You think you are having problems from the medicine you are taking. Read your medicine instructions carefully. °· Your chest pain does not go away, even after treatment. °· You develop a rash with blisters on your chest. °SEEK IMMEDIATE MEDICAL CARE IF:  °· You have increased chest pain or pain that spreads to your arm, neck, jaw, back, or abdomen. °· You develop shortness of breath, an increasing cough, or you are coughing up blood. °· You have severe back or abdominal pain, feel nauseous, or vomit. °· You develop severe weakness, fainting, or chills. °· You have a fever. °THIS IS AN EMERGENCY. Do not wait to see if the pain will go away. Get medical help at once. Call your local emergency services (911 in [place] Do not drive yourself to the hospital. °MAKE SURE YOU:  °· Understand these instructions. °· Will watch your condition. °· Will get help right away if you are not doing well or get worse. °Document Released: 03/29/2005 Document Revised: 09/11/2011 Document Reviewed: 01/23/2008 °[employer]® Patient Information ©2014 [employer]   [employer].  Dehydration, Adult Dehydration is when you lose more fluids from the body than you take in. Vital organs like the kidneys, brain, and heart cannot function without a proper amount of fluids and salt. Any loss of fluids from the body can cause dehydration.  CAUSES   Vomiting.  Diarrhea.  Excessive  sweating.  Excessive urine output.  Fever. SYMPTOMS  Mild dehydration  Thirst.  Dry lips.  Slightly dry mouth. Moderate dehydration  Very dry mouth.  Sunken eyes.  Skin does not bounce back quickly when lightly pinched and released.  Dark urine and decreased urine production.  Decreased tear production.  Headache. Severe dehydration  Very dry mouth.  Extreme thirst.  Rapid, weak pulse (more than 100 beats per minute at rest).  Cold hands and feet.  Not able to sweat in spite of heat and temperature.  Rapid breathing.  Blue lips.  Confusion and lethargy.  Difficulty being awakened.  Minimal urine production.  No tears. DIAGNOSIS  Your caregiver will diagnose dehydration based on your symptoms and your exam. Blood and urine tests will help confirm the diagnosis. The diagnostic evaluation should also identify the cause of dehydration. TREATMENT  Treatment of mild or moderate dehydration can often be done at home by increasing the amount of fluids that you drink. It is best to drink small amounts of fluid more often. Drinking too much at one time can make vomiting worse. Refer to the home care instructions below. Severe dehydration needs to be treated at the hospital where you will probably be given intravenous (IV) fluids that contain water and electrolytes. HOME CARE INSTRUCTIONS   Ask your caregiver about specific rehydration instructions.  Drink enough fluids to keep your urine clear or pale yellow.  Drink small amounts frequently if you have nausea and vomiting.  Eat as you normally do.  Avoid:  Foods or drinks high in sugar.  Carbonated drinks.  Juice.  Extremely hot or cold fluids.  Drinks with caffeine.  Fatty, greasy foods.  Alcohol.  Tobacco.  Overeating.  Gelatin desserts.  Wash your hands well to avoid spreading bacteria and viruses.  Only take over-the-counter or prescription medicines for pain, discomfort, or fever as  directed by your caregiver.  Ask your caregiver if you should continue all prescribed and over-the-counter medicines.  Keep all follow-up appointments with your caregiver. SEEK MEDICAL CARE IF:  You have abdominal pain and it increases or stays in one area (localizes).  You have a rash, stiff neck, or severe headache.  You are irritable, sleepy, or difficult to awaken.  You are weak, dizzy, or extremely thirsty. SEEK IMMEDIATE MEDICAL CARE IF:   You are unable to keep fluids down or you get worse despite treatment.  You have frequent episodes of vomiting or diarrhea.  You have blood or green matter (bile) in your vomit.  You have blood in your stool or your stool looks black and tarry.  You have not urinated in 6 to 8 hours, or you have only urinated a small amount of very dark urine.  You have a fever.  You faint. MAKE SURE YOU:   Understand these instructions.  Will watch your condition.  Will get help right away if you are not doing well or get worse. Document Released: 06/19/2005 Document Revised: 09/11/2011 Document Reviewed: 02/06/2011 [facility] Patient Information 2014 [place], [place].  Dehydration, Elderly Dehydration is when you lose more fluids from the body than you take in. Vital organs such as the kidneys, brain, and  heart cannot function without a proper amount of fluids and salt. Any loss of fluids from the body can cause dehydration.  Older adults are at a higher risk of dehydration than younger adults. As we age, our bodies are less able to conserve water and do not respond to temperature changes as well. Also, older adults do not become thirsty as easily or quickly. Because of this, older adults often do not realize they need to increase fluids to avoid dehydration.  CAUSES   Vomiting.  Diarrhea.  Excessive sweating.  Excessive urination.  Fever.  Certain medicines, such as blood pressure medicines called diuretics.  Poorly controlled blood  sugars. SIGNS AND SYMPTOMS  Mild dehydration:  Thirst.  Dry lips.  Slightly dry mouth. Moderate dehydration:  Very dry mouth.  Sunken eyes.  Skin does not bounce back quickly when lightly pinched and released.  Dark urine and decreased urine production.  Decreased tear production.  Headache. Severe dehydration:  Very dry mouth.  Extreme thirst.  Rapid, weak pulse (more than 100 beats per minute at rest).  Cold hands and feet.  Not able to sweat in spite of heat.  Rapid breathing.  Blue lips.  Confusion and lethargy.  Difficulty being awakened.  Minimal urine production.  No tears. DIAGNOSIS  Your health care provider will diagnose dehydration based on your symptoms and your exam. Blood and urine tests will help confirm the diagnosis. The diagnostic evaluation should also identify the cause of dehydration. TREATMENT  Treatment of mild or moderate dehydration can often be done at home by increasing the amount of fluids that you drink. It is best to drink small amounts of fluid more often. Drinking too much at one time can make vomiting worse. Severe dehydration needs to be treated at the hospital. You may be given IV fluids that contain water and electrolytes. HOME CARE INSTRUCTIONS   Ask your health care provider about specific rehydration instructions.  Drink enough fluids to keep your urine clear or pale yellow.  Drink small amounts frequently if you have nausea and vomiting.  Eat as you normally do.  Avoid:  Foods or drinks high in sugar.  Carbonated drinks.  Juice.  Extremely hot or cold fluids.  Drinks with caffeine.  Fatty, greasy foods.  Alcohol.  Tobacco.  Overeating.  Gelatin desserts.  Wash your hands well to avoid spreading bacteria and viruses.  Only take over-the-counter or prescription medicines for pain, discomfort, or fever as directed by your health care provider.  Ask your health care provider if you should continue  all prescribed and over-the-counter medicines.  Keep all follow-up appointments with your health care provider. SEEK MEDICAL CARE IF:  You have abdominal pain, and it increases or stays in one area (localizes).  You have a rash, stiff neck, or severe headache.  You are irritable, sleepy, or difficult to awaken.  You are weak, dizzy, or extremely thirsty. SEEK IMMEDIATE MEDICAL CARE IF:   You are unable to keep fluids down, or you get worse despite treatment.  You have frequent episodes of vomiting or diarrhea.  You have blood or green matter (bile) in your vomit.  You have blood in your stool, or your stool looks black and tarry.  You have not urinated in 6 8 hours, or you have only urinated a small amount of very dark urine.  You have a fever.  You faint. MAKE SURE YOU:   Understand these instructions.  Will watch your condition.  Will get help right away  if you are not doing well or get worse. Document Released: 09/09/2003 Document Revised: 04/09/2013 Document Reviewed: 02/24/2013 [facility] Patient Information 2014 [place].  Hematuria, Adult Hematuria is blood in your urine. It can be caused by a bladder infection, kidney infection, prostate infection, kidney stone, or cancer of your urinary tract. Infections can usually be treated with medicine, and a kidney stone usually will pass through your urine. If neither of these is the cause of your hematuria, further workup to find out the reason may be needed. It is very important that you tell your health care provider about any blood you see in your urine, even if the blood stops without treatment or happens without causing pain. Blood in your urine that happens and then stops and then happens again can be a symptom of a very serious condition. Also, pain is not a symptom in the initial stages of many urinary cancers. HOME CARE INSTRUCTIONS   Drink lots of fluid, 3 4 quarts a day. If you have been diagnosed with an  infection, cranberry juice is especially recommended, in addition to large amounts of water.  Avoid caffeine, tea, and carbonated beverages, because they tend to irritate the bladder.  Avoid alcohol because it may irritate the prostate.  Only take over-the-counter or prescription medicines for pain, discomfort, or fever as directed by your health care provider.  If you have been diagnosed with a kidney stone, follow your health care provider's instructions regarding straining your urine to catch the stone.  Empty your bladder often. Avoid holding urine for long periods of time.  After a bowel movement, women should cleanse front to back. Use each tissue only once.  Empty your bladder before and after sexual intercourse if you are a male. SEEK MEDICAL CARE IF: You develop back pain, fever, a feeling of sickness in your stomach (nausea), or vomiting or if your symptoms are not better in 3 days. Return sooner if you are getting worse. SEEK IMMEDIATE MEDICAL CARE IF:   You have a persistent fever, with a temperature of 101.62F (38.8C) or greater.  You develop severe vomiting and are unable to keep the medicine down.  You develop severe back or abdominal pain despite taking your medicines.  You begin passing a large amount of blood or clots in your urine.  You feel extremely weak or faint, or you pass out. MAKE SURE YOU:   Understand these instructions.  Will watch your condition.  Will get help right away if you are not doing well or get worse. Document Released: 06/19/2005 Document Revised: 04/09/2013 Document Reviewed: 02/17/2013 [facility] Patient Information 2014 [place].

## 2013-08-11 NOTE — ED Notes (Signed)
Patient states started feeling bad last night.  Patient states he "might have mixed some medications".   Unable to tell me what he mixed.   Patient states he now has headache.   Patient states had urinary frequency last night.

## 2013-08-11 NOTE — ED Notes (Signed)
Phlebotomy at bedside.

## 2013-08-11 NOTE — ED Provider Notes (Addendum)
TIME SEEN: 11:23 AM  CHIEF COMPLAINT: Headache, chest pain, abdominal pain, hypotension   HPI:  Patient is an 80 year old male with a history of hypertension, diabetes, hyperlipidemia, chronic headaches and chronic chest pain who presents emergency department with multiple complaints. Pt is a very poor historian which makes obtaining history difficult. Patient reports that last night he had urinary frequency and had to go to the bathroom several times in the middle of the night. He denies any dysuria or hematuria. He also had vomiting but no diarrhea. Today he feels very lightheaded and is hypotensive. He reports he took medication for his chronic headache and chronic chest pain last night but cannot tell me what these medications were. When asked if the patient is on blood pressure medication, he states now that he is on metoprolol and furosemide. He reports he is having left-sided throbbing headache and has had similar headaches in the past. No thunderclap headache. No numbness, tingling or focal weakness. He did have an episode of blurry vision that he reports lasted for several seconds earlier this morning and is now gone, no vision loss. No history of head injury. No neck pain or neck stiffness. No fever. He is also having central chest pain that he is unable to describe. No aggravating or alleviating factors. No shortness of breath. He has had this chest pain for "years". Patient is also complaining of periumbilical abdominal pain that he describes as a achy pain without radiation. No aggravating or alleviating factors.  ROS: See HPI Constitutional: no fever  Eyes: no drainage  ENT: no runny nose   Cardiovascular:  chest pain  Resp: no SOB  GI:  vomiting GU: no dysuria Integumentary: no rash  Allergy: no hives  Musculoskeletal: no leg swelling  Neurological: no slurred speech ROS otherwise negative  PAST MEDICAL HISTORY/PAST SURGICAL HISTORY:  Past Medical History  Diagnosis Date  .  Diabetes mellitus   . Hypertension   . Obesity   . H/O hiatal hernia   . Arthritis   . Hyperlipidemia   . Neuropathy     MEDICATIONS:  Prior to Admission medications   Medication Sig Start Date End Date Taking? Authorizing Provider  aspirin EC 81 MG tablet Take 1 tablet (81 mg total) by mouth daily. 05/28/13   Reyne Dumas, MD  ciprofloxacin (CILOXAN) 0.3 % ophthalmic solution Place 1-2 drops into the left eye 4 (four) times daily. X 5 days 07/26/13   Clayton Bibles, PA-C  docusate sodium (COLACE) 100 MG capsule Take 1 capsule (100 mg total) by mouth every 12 (twelve) hours. 06/16/13   Bastien Strawser N Itza Maniaci, DO  furosemide (LASIX) 40 MG tablet Take 1 tablet (40 mg total) by mouth daily. 07/22/13   Reyne Dumas, MD  gabapentin (NEURONTIN) 300 MG capsule Take 1 capsule (300 mg total) by mouth 3 (three) times daily. 05/22/13   Kathalene Frames, MD  HYDROcodone-acetaminophen (NORCO/VICODIN) 5-325 MG per tablet Take 1 tablet by mouth every 4 (four) hours as needed. 07/26/13   Clayton Bibles, PA-C  Hydrocodone-Acetaminophen 5-300 MG TABS Take 5 mg by mouth every 8 (eight) hours as needed (for pain uncontrolled with ibuprofen). 06/16/13   Sienna Stonehocker N Shaina Gullatt, DO  insulin NPH-regular (NOVOLIN 70/30) (70-30) 100 UNIT/ML injection Inject 50 Units into the skin 2 (two) times daily with a meal. 05/28/13   Reyne Dumas, MD  metoprolol tartrate (LOPRESSOR) 25 MG tablet Take 1 tablet (25 mg total) by mouth 2 (two) times daily. 05/28/13   Reyne Dumas, MD  oxyCODONE-acetaminophen (PERCOCET/ROXICET) 5-325 MG per tablet Take 2 tablets by mouth every 4 (four) hours as needed for pain. 02/08/13   Domenic Moras, PA-C  pregabalin (LYRICA) 50 MG capsule Take 1 capsule (50 mg total) by mouth daily. 07/02/13   Angelica Chessman, MD  traMADol (ULTRAM) 50 MG tablet Take 1 tablet (50 mg total) by mouth every 12 (twelve) hours as needed for moderate pain (In his feet). 05/28/13   Reyne Dumas, MD  Vitamin D, Ergocalciferol, (DRISDOL) 50000 UNITS CAPS  capsule Take 1 capsule (50,000 Units total) by mouth every 7 (seven) days. 07/02/13   Reyne Dumas, MD    ALLERGIES:  No Known Allergies  SOCIAL HISTORY:  History  Substance Use Topics  . Smoking status: Former Smoker    Types: Cigarettes    Quit date: 12/30/1996  . Smokeless tobacco: Never Used  . Alcohol Use: No    FAMILY HISTORY: Family History  Problem Relation Age of Onset  . Cancer Mother     EXAM: BP 87/47  Pulse 92  Temp(Src) 98 F (36.7 C) (Oral)  Resp 16  SpO2 98% CONSTITUTIONAL: Alert and oriented and responds appropriately to questions. Well-appearing; well-nourished, elderly, no apparent distress, hypotensive  HEAD: Normocephalic EYES: Conjunctivae clear, PERRL ENT: normal nose; no rhinorrhea; slightly dry  mucous membranes; pharynx without lesions noted NECK: Supple, no meningismus, no LAD  CARD: RRR; S1 and S2 appreciated; no murmurs, no clicks, no rubs, no gallops RESP: Normal chest excursion without splinting or tachypnea; breath sounds clear and equal bilaterally; no wheezes, no rhonchi, no rales,  ABD/GI: Normal bowel sounds; non-distended; soft,  diffusely tender to palpation, no rebound or guarding, no peritoneal signs  BACK:  The back appears normal and is non-tender to palpation, there is no CVA tenderness EXT: Normal ROM in all joints; non-tender to palpation; no edema; normal capillary refill; no cyanosis    SKIN: Normal color for age and race; warm NEURO: Moves all extremities equally, sensation to light touch intact diffusely, cranial nerves II through XII intact  PSYCH: The patient's mood and manner are appropriate. Grooming and personal hygiene are appropriate.  MEDICAL DECISION MAKING:  Patient here with hypotension which may be do to infection versus dehydration from vomiting versus possible medication overdose. He is not bradycardic however making the temporal overdose less likely. We'll continue to closely monitor, give IV fluids, obtain  labs, cultures, urine.  We'll also obtain chest x-ray to evaluate for possible pneumonia. Will obtain CT imaging of his abdomen given his complaints of abdominal pain and vomiting in the setting of hypotension.  ED PROGRESS:  Patient reports feeling much better. He has received 1 L of IV fluids and his hypotension and dizziness have resolved. He is no longer complaining of headache or chest pain. No longer having abdominal pain his exam is benign. He is laughing and joking and the room. He has been able to drink without any further vomiting. His head CT, chest x-ray and abdominal CT show no acute abnormality. His labs are reassuring. 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 Patient denies a history of kidney stones. There is no other sign of urinary tract infection. He has had blood in his urine several times in the past. Do not feel he needs to have this treated at this time but will have him followup with his primary care Dr. to have this rechecked. Patient reports he is feeling much better and is ready to be discharged home. I feel this is  a reasonable plan given patient is now able to drink without further vomiting I feel he can keep himself hydrated at home. I do not feel he needs a second set of cardiac enzymes given this chest pain has been constant for several weeks in his first set is negative. Have given strict return precautions. Patient and wife verbalize understanding and are comfortable plan.    EKG Interpretation    Date/Time:  Monday August 11 2013 11:28:23 EST Ventricular Rate:  91 PR Interval:  238 QRS Duration: 91 QT Interval:  396 QTC Calculation: 487 R Axis:   47 Text Interpretation:  Sinus rhythm Prolonged PR interval Probable left atrial enlargement ST elevation suggests acute pericarditis Borderline prolonged QT interval No significant change since last tracing Confirmed by Evaleen Sant  DO, Thara Searing ([id number]) on 08/11/2013 11:32:44 AM             Delice Bison Valeen Borys,  DO 08/11/13 [place], DO 08/11/13 [place], DO 08/11/13 [place], DO 08/11/13 [id number]

## 2013-08-11 NOTE — ED Notes (Signed)
Searched for IV by this RN and Oceanographer. 1 Unsuccessful attempt by this RN, blood return but catheter would not advance and vein blown. Dr. Leonides Schanz made aware.

## 2013-08-12 LAB — URINE CULTURE

## 2013-08-17 LAB — CULTURE, BLOOD (ROUTINE X 2)
CULTURE: NO GROWTH
CULTURE: NO GROWTH

## 2013-08-29 ENCOUNTER — Observation Stay (HOSPITAL_COMMUNITY)
Admission: EM | Admit: 2013-08-29 | Discharge: 2013-08-31 | Disposition: A | Discharge status: Home / Self Care | Payer: Medicare PPO | Attending: Cardiovascular Disease | Admitting: Cardiovascular Disease

## 2013-08-29 ENCOUNTER — Emergency Department (HOSPITAL_COMMUNITY): Payer: Medicare PPO

## 2013-08-29 DIAGNOSIS — M129 Arthropathy, unspecified: Secondary | ICD-10-CM | POA: Insufficient documentation

## 2013-08-29 DIAGNOSIS — I10 Essential (primary) hypertension: Secondary | ICD-10-CM | POA: Insufficient documentation

## 2013-08-29 DIAGNOSIS — L301 Dyshidrosis [pompholyx]: Secondary | ICD-10-CM | POA: Insufficient documentation

## 2013-08-29 DIAGNOSIS — R0602 Shortness of breath: Secondary | ICD-10-CM | POA: Insufficient documentation

## 2013-08-29 DIAGNOSIS — G589 Mononeuropathy, unspecified: Secondary | ICD-10-CM | POA: Insufficient documentation

## 2013-08-29 DIAGNOSIS — R0789 Other chest pain: Principal | ICD-10-CM | POA: Insufficient documentation

## 2013-08-29 DIAGNOSIS — Z79899 Other long term (current) drug therapy: Secondary | ICD-10-CM | POA: Insufficient documentation

## 2013-08-29 DIAGNOSIS — E785 Hyperlipidemia, unspecified: Secondary | ICD-10-CM | POA: Insufficient documentation

## 2013-08-29 DIAGNOSIS — Z794 Long term (current) use of insulin: Secondary | ICD-10-CM | POA: Insufficient documentation

## 2013-08-29 DIAGNOSIS — M2550 Pain in unspecified joint: Secondary | ICD-10-CM | POA: Insufficient documentation

## 2013-08-29 DIAGNOSIS — Z87891 Personal history of nicotine dependence: Secondary | ICD-10-CM | POA: Insufficient documentation

## 2013-08-29 DIAGNOSIS — K449 Diaphragmatic hernia without obstruction or gangrene: Secondary | ICD-10-CM | POA: Insufficient documentation

## 2013-08-29 DIAGNOSIS — E669 Obesity, unspecified: Secondary | ICD-10-CM | POA: Insufficient documentation

## 2013-08-29 DIAGNOSIS — R079 Chest pain, unspecified: Secondary | ICD-10-CM | POA: Diagnosis present

## 2013-08-29 DIAGNOSIS — IMO0001 Reserved for inherently not codable concepts without codable children: Secondary | ICD-10-CM | POA: Insufficient documentation

## 2013-08-29 DIAGNOSIS — E119 Type 2 diabetes mellitus without complications: Secondary | ICD-10-CM | POA: Insufficient documentation

## 2013-08-29 DIAGNOSIS — Z7982 Long term (current) use of aspirin: Secondary | ICD-10-CM | POA: Insufficient documentation

## 2013-08-29 LAB — BASIC METABOLIC PANEL
BUN: 11 mg/dL (ref 6–23)
CO2: 24 mEq/L (ref 19–32)
Calcium: 9.2 mg/dL (ref 8.4–10.5)
Chloride: 100 mEq/L (ref 96–112)
Creatinine, Ser: 1.09 mg/dL (ref 0.50–1.35)
GFR calc non Af Amer: 62 mL/min — ABNORMAL LOW (ref >90)
GFR, EST AFRICAN AMERICAN: 71 mL/min — AB (ref >90)
Glucose, Bld: 273 mg/dL — ABNORMAL HIGH (ref 70–99)
POTASSIUM: 4.7 meq/L (ref 3.7–5.3)
Sodium: 136 mEq/L — ABNORMAL LOW (ref 137–147)

## 2013-08-29 LAB — CBC
HCT: 47.9 % (ref 39.0–52.0)
HEMOGLOBIN: 16.8 g/dL (ref 13.0–17.0)
MCH: 31.9 pg (ref 26.0–34.0)
MCHC: 35.1 g/dL (ref 30.0–36.0)
MCV: 91.1 fL (ref 78.0–100.0)
Platelets: 167 10*3/uL (ref 150–400)
RBC: 5.26 MIL/uL (ref 4.22–5.81)
RDW: 14.1 % (ref 11.5–15.5)
WBC: 8 10*3/uL (ref 4.0–10.5)

## 2013-08-29 LAB — GLUCOSE, CAPILLARY: GLUCOSE-CAPILLARY: 319 mg/dL — AB (ref 70–99)

## 2013-08-29 LAB — TROPONIN I

## 2013-08-29 LAB — PRO B NATRIURETIC PEPTIDE: Pro B Natriuretic peptide (BNP): 30.2 pg/mL (ref 0–450)

## 2013-08-29 LAB — I-STAT TROPONIN, ED: Troponin i, poc: 0.01 ng/mL (ref 0.00–0.08)

## 2013-08-29 MED ORDER — HEPARIN BOLUS VIA INFUSION
4000.0000 [IU] | Freq: Once | INTRAVENOUS | Status: AC
  Administered 2013-08-29: 4000 [IU] via INTRAVENOUS
  Filled 2013-08-29: qty 4000

## 2013-08-29 MED ORDER — ASPIRIN 81 MG PO CHEW: 324.0000 mg | CHEWABLE_TABLET | ORAL | Status: AC

## 2013-08-29 MED ORDER — ONDANSETRON HCL 4 MG/2ML IJ SOLN: 4.0000 mg | Freq: Four times a day (QID) | INTRAMUSCULAR | Status: DC | PRN

## 2013-08-29 MED ORDER — DOXAZOSIN MESYLATE 2 MG PO TABS
2.0000 mg | ORAL_TABLET | Freq: Every day | ORAL | Status: DC
  Administered 2013-08-29 – 2013-08-31 (×3): 2 mg via ORAL
  Filled 2013-08-29 (×3): qty 1

## 2013-08-29 MED ORDER — SODIUM CHLORIDE 0.9 % IJ SOLN
3.0000 mL | Freq: Two times a day (BID) | INTRAMUSCULAR | Status: DC
  Administered 2013-08-29: 3 mL via INTRAVENOUS

## 2013-08-29 MED ORDER — GABAPENTIN 300 MG PO CAPS
300.0000 mg | ORAL_CAPSULE | Freq: Every day | ORAL | Status: DC
  Administered 2013-08-29 – 2013-08-30 (×2): 300 mg via ORAL
  Filled 2013-08-29 (×3): qty 1

## 2013-08-29 MED ORDER — INSULIN ASPART PROT & ASPART (70-30 MIX) 100 UNIT/ML ~~LOC~~ SUSP
45.0000 [IU] | Freq: Two times a day (BID) | SUBCUTANEOUS | Status: DC
  Filled 2013-08-29: qty 10

## 2013-08-29 MED ORDER — HEPARIN (PORCINE) IN NACL 100-0.45 UNIT/ML-% IJ SOLN
1500.0000 [IU]/h | INTRAMUSCULAR | Status: DC
  Administered 2013-08-29: 1500 [IU]/h via INTRAVENOUS
  Filled 2013-08-29: qty 250

## 2013-08-29 MED ORDER — ASPIRIN EC 81 MG PO TBEC
81.0000 mg | DELAYED_RELEASE_TABLET | Freq: Every day | ORAL | Status: DC
  Administered 2013-08-30 – 2013-08-31 (×2): 81 mg via ORAL
  Filled 2013-08-29 (×2): qty 1

## 2013-08-29 MED ORDER — ASPIRIN 300 MG RE SUPP
300.0000 mg | RECTAL | Status: AC
  Filled 2013-08-29: qty 1

## 2013-08-29 MED ORDER — ACETAMINOPHEN 325 MG PO TABS
650.0000 mg | ORAL_TABLET | ORAL | Status: DC | PRN
  Administered 2013-08-31: 650 mg via ORAL
  Filled 2013-08-29: qty 2

## 2013-08-29 MED ORDER — FUROSEMIDE 40 MG PO TABS
40.0000 mg | ORAL_TABLET | Freq: Every day | ORAL | Status: DC
  Administered 2013-08-30 – 2013-08-31 (×2): 40 mg via ORAL
  Filled 2013-08-29 (×2): qty 1

## 2013-08-29 MED ORDER — HYDROCODONE-ACETAMINOPHEN 10-325 MG PO TABS
1.0000 | ORAL_TABLET | Freq: Every day | ORAL | Status: DC | PRN
  Administered 2013-08-29 – 2013-08-30 (×2): 1 via ORAL
  Filled 2013-08-29 (×2): qty 1

## 2013-08-29 MED ORDER — NITROGLYCERIN 0.4 MG SL SUBL: 0.4000 mg | SUBLINGUAL_TABLET | SUBLINGUAL | Status: DC | PRN

## 2013-08-29 MED ORDER — INSULIN ASPART 100 UNIT/ML ~~LOC~~ SOLN
0.0000 [IU] | Freq: Three times a day (TID) | SUBCUTANEOUS | Status: DC
  Administered 2013-08-30: 15 [IU] via SUBCUTANEOUS
  Administered 2013-08-31: 7 [IU] via SUBCUTANEOUS

## 2013-08-29 MED ORDER — SODIUM CHLORIDE 0.9 % IJ SOLN: 3.0000 mL | INTRAMUSCULAR | Status: DC | PRN

## 2013-08-29 MED ORDER — METOPROLOL TARTRATE 25 MG PO TABS
25.0000 mg | ORAL_TABLET | Freq: Two times a day (BID) | ORAL | Status: DC
  Administered 2013-08-29 – 2013-08-31 (×3): 25 mg via ORAL
  Filled 2013-08-29 (×5): qty 1

## 2013-08-29 MED ORDER — SODIUM CHLORIDE 0.9 % IV SOLN
250.0000 mL | INTRAVENOUS | Status: DC | PRN
  Administered 2013-08-29: 250 mL via INTRAVENOUS

## 2013-08-29 NOTE — ED Notes (Signed)
IV team unable to obtain access or labwork.

## 2013-08-29 NOTE — ED Notes (Signed)
Attempted report 

## 2013-08-29 NOTE — ED Notes (Signed)
Pt reports he is a difficult for blood draw. This RN attempted x 1. IV team paged. Ultrasound at bedside for second RN to attempt.

## 2013-08-29 NOTE — ED Notes (Signed)
EDPA Jarrett Soho made aware that blood hemolyzed, states to prepare for Femoral draw.

## 2013-08-29 NOTE — ED Notes (Addendum)
Per EMS: Pt from PCP with c/o 5/10 non-radiating left sided CP x 2 days with HA. Pt seen by PCP 2 days prior for same complaint, reports it hasn't resolved. Given 324 aspirin. Given 1 nitro relieving pain from 6/10 to 5/10. Mild elevation in V2, V3. 148/90. 68 SR. 100% 2L. AOx4.

## 2013-08-29 NOTE — H&P (Signed)
[name] is an 80 y.o. male.   Chief Complaint: Chest pain HPI: 80 year old male with history of diabetes, hypertension, obesity, hyperlipidemia, neuropathy, and hiatal hernia who presents today with pressure type non-radiating chest pain since last night. It has been constant since last night and is improving. It is associated with mild shortness of breath. He had a brief episode of diaphoresis early this morning which resolved after a few minutes. His symptoms improved with SL nitroglycerin.    Past Medical History  Diagnosis Date  . Diabetes mellitus   . Hypertension   . Obesity   . H/O hiatal hernia   . Arthritis   . Hyperlipidemia   . Neuropathy       Past Surgical History  Procedure Laterality Date  . Abdominal surgery      pt has abdominal scarring and states he had sx but not sure what kind  . Back surgery    . Cholecystectomy      Family History  Problem Relation Age of Onset  . Cancer Mother    Social History:  reports that he quit smoking about 16 years ago. His smoking use included Cigarettes. He smoked 0.00 packs per day. He has never used smokeless tobacco. He reports that he does not drink alcohol or use illicit drugs.  Allergies: No Known Allergies   (Not in a hospital admission)  Results for orders placed during the hospital encounter of 08/29/13 (from the past 48 hour(s))  PRO B NATRIURETIC PEPTIDE     Status: None   Collection Time    08/29/13  6:15 PM      Result Value Ref Range   Pro B Natriuretic peptide (BNP) 30.2  0 - 450 pg/mL  TROPONIN I     Status: None   Collection Time    08/29/13  6:15 PM      Result Value Ref Range   Troponin I <0.30  <0.30 ng/mL   Comment:            Due to the release kinetics of cTnI,     a negative result within the first hours     of the onset of symptoms does not rule out     myocardial infarction with certainty.     If myocardial infarction is still suspected,     repeat the test at appropriate  intervals.  BASIC METABOLIC PANEL     Status: Abnormal   Collection Time    08/29/13  6:15 PM      Result Value Ref Range   Sodium 136 (*) 137 - 147 mEq/L   Potassium 4.7  3.7 - 5.3 mEq/L   Chloride 100  96 - 112 mEq/L   CO2 24  19 - 32 mEq/L   Glucose, Bld 273 (*) 70 - 99 mg/dL   BUN 11  6 - 23 mg/dL   Creatinine, Ser 1.09  0.50 - 1.35 mg/dL   Calcium 9.2  8.4 - 10.5 mg/dL   GFR calc non Af Amer 62 (*) >90 mL/min   GFR calc Af Amer 71 (*) >90 mL/min   Comment: (NOTE)     The eGFR has been calculated using the CKD EPI equation.     This calculation has not been validated in all clinical situations.     eGFR's persistently <90 mL/min signify possible Chronic Kidney     Disease.  CBC     Status: None   Collection Time    08/29/13  6:15 PM      Result Value Ref Range   WBC 8.0  4.0 - 10.5 K/uL   RBC 5.26  4.22 - 5.81 MIL/uL   Hemoglobin 16.8  13.0 - 17.0 g/dL   HCT 47.9  39.0 - 52.0 %   MCV 91.1  78.0 - 100.0 fL   MCH 31.9  26.0 - 34.0 pg   MCHC 35.1  30.0 - 36.0 g/dL   RDW 14.1  11.5 - 15.5 %   Platelets 167  150 - 400 K/uL  I-STAT TROPOININ, ED     Status: None   Collection Time    08/29/13  6:18 PM      Result Value Ref Range   Troponin i, poc 0.01  0.00 - 0.08 ng/mL   Comment 3            Comment: Due to the release kinetics of cTnI,     a negative result within the first hours     of the onset of symptoms does not rule out     myocardial infarction with certainty.     If myocardial infarction is still suspected,     repeat the test at appropriate intervals.   Dg Chest 2 View  08/29/2013   CLINICAL DATA:  Dyspnea and chest pain and cough  EXAM: CHEST  2 VIEW  COMPARISON:  DG CHEST 1V PORT dated 08/11/2013  FINDINGS: The lungs are adequately inflated. The interstitial markings are increased but this is not new. Coarse lung markings in the right upper hemithorax are present but stable. The cardiac silhouette is enlarged but stable. The pulmonary vascularity is mildly  prominent centrally but also stable. There is no pleural effusion.  IMPRESSION: The findings are consistent with low-grade CHF. There may be underlying COPD. There is no focal pneumonia nor pleural effusion.   Electronically Signed   By: David  Martinique   On: 08/29/2013 16:18    ROS Constitutional: Positive for diaphoresis. Negative for fever and chills.  Respiratory: Positive for shortness of breath.  Cardiovascular: Positive for chest pain.  Gastrointestinal: Negative for nausea, vomiting and abdominal pain.  Musculoskeletal: Positive for arthralgias and myalgias.  All other systems reviewed and are negative.  Blood pressure 113/82, pulse 89, temperature 98.6 F (37 C), temperature source Oral, resp. rate 21, SpO2 94.00%. Physical Exam  Constitutional: He appears well-developed and well-nourished. No distress.  HENT: Head: Normocephalic and atraumatic. Right Ear: External ear normal. Left Ear: External ear normal. Nose: Nose normal. Brown eyes, Conjunctivae are normal. Tongue is pink and dry. Neck: Normal range of motion. No tracheal deviation present.  Cardiovascular: Normal rate, regular rhythm and normal S1 and S2..  Pulmonary/Chest: Effort normal and breath sounds normal. No stridor.  Abdominal: Soft. Distended but non-tender.  Musculoskeletal: Normal range of motion.  Neurological: He is alert and oriented to person, place, and time. Moves all 4 extremities. Skin: Skin is warm and dry.   Psychiatric: He has a normal mood and affect. His behavior is normal.    Assessment/Plan Unstable angina Hypertension DM, II Dyslipidemia Morbid Obesity  Place in observation/R/O MI/Nuclear stress test in AM.  Charlsey Moragne S 08/29/2013, 7:39 PM

## 2013-08-29 NOTE — Progress Notes (Signed)
ANTICOAGULATION CONSULT NOTE - Initial Consult  Pharmacy Consult for heparin Indication: chest pain/ACS  No Known Allergies  Patient Measurements: Height: 5\' 10"  (177.8 cm) Weight: 270 lb 6.4 oz (122.653 kg) IBW/kg (Calculated) : 73 Heparin Dosing Weight: 100kg  Vital Signs: Temp: 98.6 F (37 C) (02/27 1705) Temp src: Oral (02/27 1705) BP: 112/71 mmHg (02/27 2015) Pulse Rate: 81 (02/27 2015)  Labs:  Recent Labs  08/29/13 1815  HGB 16.8  HCT 47.9  PLT 167  CREATININE 1.09  TROPONINI <0.30    Estimated Creatinine Clearance: 69.8 ml/min (by C-G formula based on Cr of 1.09).   Medical History: Past Medical History  Diagnosis Date  . Diabetes mellitus   . Hypertension   . Obesity   . H/O hiatal hernia   . Arthritis   . Hyperlipidemia   . Neuropathy     Medications:  Prescriptions prior to admission  Medication Sig Dispense Refill  . aspirin EC 81 MG tablet Take 1 tablet (81 mg total) by mouth daily.  90 tablet  2  . doxazosin (CARDURA) 2 MG tablet Take 2 mg by mouth daily.      . furosemide (LASIX) 40 MG tablet Take 1 tablet (40 mg total) by mouth daily.  30 tablet  3  . gabapentin (NEURONTIN) 300 MG capsule Take 300 mg by mouth at bedtime.      Marland Kitchen HYDROcodone-acetaminophen (NORCO) 10-325 MG per tablet Take 1 tablet by mouth daily as needed. For pain      . insulin NPH-regular Human (NOVOLIN 70/30) (70-30) 100 UNIT/ML injection Inject 45 Units into the skin 2 (two) times daily with a meal.      . metoprolol tartrate (LOPRESSOR) 25 MG tablet Take 1 tablet (25 mg total) by mouth 2 (two) times daily.  180 tablet  3  . Vitamin D, Ergocalciferol, (DRISDOL) 50000 UNITS CAPS capsule Take 1 capsule (50,000 Units total) by mouth every 7 (seven) days.  10 capsule  2   Scheduled:  . aspirin  324 mg Oral NOW   Or  . aspirin  300 mg Rectal NOW  . [START ON 08/30/2013] aspirin EC  81 mg Oral Daily  . doxazosin  2 mg Oral Daily  . furosemide  40 mg Oral Daily  . gabapentin   300 mg Oral QHS  . [START ON 08/30/2013] insulin aspart  0-20 Units Subcutaneous TID WC  . [START ON 08/30/2013] insulin aspart protamine- aspart  45 Units Subcutaneous BID WC  . metoprolol tartrate  25 mg Oral BID  . sodium chloride  3 mL Intravenous Q12H   Infusions:    Assessment: 80 yo who was admitted for CP. Plan is to observe and stress test in AM. IV heparin has been ordered to r/o MI  Goal of Therapy:  Heparin level 0.3-0.7 units/ml Monitor platelets by anticoagulation protocol: Yes   Plan:   Heparin bolus 4000 units x1 Heparin drip at  1500 units/hr Check AM HL and daily Daily CBC

## 2013-08-29 NOTE — ED Provider Notes (Signed)
  Face-to-face evaluation   History: Chest pain since last night. 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 Heart regular rate and rhythm. No murmur. Lungs clear.  Medical screening examination/treatment/procedure(s) were conducted as a shared visit with non-physician practitioner(s) and myself.  I personally evaluated the patient during the encounter  Richarda Blade, MD 08/29/13 2035

## 2013-08-29 NOTE — ED Notes (Signed)
IV team at bedside 

## 2013-08-29 NOTE — ED Provider Notes (Signed)
CSN: [id number]     Arrival date & time 08/29/13  1349 History   First MD Initiated Contact with Patient 08/29/13 1431     Chief Complaint  Patient presents with  . Chest Pain     (Consider location/radiation/quality/duration/timing/severity/associated sxs/prior Treatment) HPI Comments: Patient is an 80 year old male with history of diabetes, hypertension, obesity, hyperlipidemia, neuropathy, and hiatal hernia who presents today with chest pain since last night. He describes the pain as a pressure sensation. It has been constant since last night and is improving. The pain does not radiate and stays in his chest. It is associated with mild shortness of breath. He had a brief episode of diaphoresis early this morning which resolved after a few minutes. His symptoms improved with nitro. He went to his primary care physician who sent him here for further evaluation. He has seen Dr. Terrence Dupont in the past. Most recently appears to be in the summer of 2013. He cannot recall when exactly this was. At that time he had a stress test which was able to r/o MI. He reports doctors have told him he "has had a light heart attack" in the past. He also complains of years of bilateral, gradually worsening foot pain. He has been told this is his neuropathy from his diabetes. He states, "the doctors can't fix me and I want them to just cut my feet off". He is a former smoker. He denies fevers, chills, nausea, vomiting, abdominal pain.    Patient is a 80 y.o. male presenting with chest pain. The history is provided by the patient. No language interpreter was used.  Chest Pain Associated symptoms: diaphoresis and shortness of breath   Associated symptoms: no abdominal pain, no fever, no nausea and not vomiting     Past Medical History  Diagnosis Date  . Diabetes mellitus   . Hypertension   . Obesity   . H/O hiatal hernia   . Arthritis   . Hyperlipidemia   . Neuropathy    Past Surgical History  Procedure  Laterality Date  . Abdominal surgery      pt has abdominal scarring and states he had sx but not sure what kind  . Back surgery    . Cholecystectomy     Family History  Problem Relation Age of Onset  . Cancer Mother    History  Substance Use Topics  . Smoking status: Former Smoker    Types: Cigarettes    Quit date: 12/30/1996  . Smokeless tobacco: Never Used  . Alcohol Use: No    Review of Systems  Constitutional: Positive for diaphoresis. Negative for fever and chills.  Respiratory: Positive for shortness of breath.   Cardiovascular: Positive for chest pain.  Gastrointestinal: Negative for nausea, vomiting and abdominal pain.  Musculoskeletal: Positive for arthralgias and myalgias.  All other systems reviewed and are negative.      Allergies  Review of patient's allergies indicates no known allergies.  Home Medications   Current Outpatient Rx  Name  Route  Sig  Dispense  Refill  . aspirin EC 81 MG tablet   Oral   Take 1 tablet (81 mg total) by mouth daily.   90 tablet   2   . doxazosin (CARDURA) 2 MG tablet   Oral   Take 2 mg by mouth daily.         . furosemide (LASIX) 40 MG tablet   Oral   Take 1 tablet (40 mg total) by mouth daily.  30 tablet   3   . gabapentin (NEURONTIN) 300 MG capsule   Oral   Take 300 mg by mouth at bedtime.         Marland Kitchen HYDROcodone-acetaminophen (NORCO) 10-325 MG per tablet   Oral   Take 1 tablet by mouth daily as needed. For pain         . insulin NPH-regular Human (NOVOLIN 70/30) (70-30) 100 UNIT/ML injection   Subcutaneous   Inject 45 Units into the skin 2 (two) times daily with a meal.         . metoprolol tartrate (LOPRESSOR) 25 MG tablet   Oral   Take 1 tablet (25 mg total) by mouth 2 (two) times daily.   180 tablet   3   . Vitamin D, Ergocalciferol, (DRISDOL) 50000 UNITS CAPS capsule   Oral   Take 1 capsule (50,000 Units total) by mouth every 7 (seven) days.   10 capsule   2    BP 112/66  Pulse 87   Temp(Src) 97.8 F (36.6 C) (Oral)  Resp 20  SpO2 95% Physical Exam  Nursing note and vitals reviewed. Constitutional: He is oriented to person, place, and time. He appears well-developed and well-nourished. No distress.  HENT:  Head: Normocephalic and atraumatic.  Right Ear: External ear normal.  Left Ear: External ear normal.  Nose: Nose normal.  Eyes: Conjunctivae are normal.  Neck: Normal range of motion. No tracheal deviation present.  Cardiovascular: Normal rate, regular rhythm and normal heart sounds.   Pulmonary/Chest: Effort normal and breath sounds normal. No stridor.  Abdominal: Soft. He exhibits no distension. There is no tenderness.  protuberant  Musculoskeletal: Normal range of motion.  Neurological: He is alert and oriented to person, place, and time.  Skin: Skin is warm and dry. He is not diaphoretic.  Psychiatric: He has a normal mood and affect. His behavior is normal.    ED Course  Procedures (including critical care time) Labs Review Labs Reviewed  BASIC METABOLIC PANEL - Abnormal; Notable for the following:    Sodium 136 (*)    Glucose, Bld 273 (*)    GFR calc non Af Amer 62 (*)    GFR calc Af Amer 71 (*)    All other components within normal limits  PRO B NATRIURETIC PEPTIDE  TROPONIN I  CBC  I-STAT TROPOININ, ED   Imaging Review Dg Chest 2 View  08/29/2013   CLINICAL DATA:  Dyspnea and chest pain and cough  EXAM: CHEST  2 VIEW  COMPARISON:  DG CHEST 1V PORT dated 08/11/2013  FINDINGS: The lungs are adequately inflated. The interstitial markings are increased but this is not new. Coarse lung markings in the right upper hemithorax are present but stable. The cardiac silhouette is enlarged but stable. The pulmonary vascularity is mildly prominent centrally but also stable. There is no pleural effusion.  IMPRESSION: The findings are consistent with low-grade CHF. There may be underlying COPD. There is no focal pneumonia nor pleural effusion.   Electronically  Signed   By: David  Martinique   On: 08/29/2013 16:18     EKG Interpretation   Date/Time:  Friday August 29 2013 14:21:03 EST Ventricular Rate:  85 PR Interval:  244 QRS Duration: 90 QT Interval:  389 QTC Calculation: 463 R Axis:   55 Text Interpretation:  Sinus rhythm Prolonged PR interval Borderline ST  elevation, inferior leads since last tracing no significant change  Confirmed by [facility]  MD, ELLIOTT [phone]) on 08/29/2013 3:16:26  PM      MDM   Final diagnoses:  Chest pain   Concern for cardiac etiology of Chest Pain. Cardiology has been consulted and will see patient in the ED for likely admit. Pt does not meet criteria for CP protocol and a further evaluation is recommended. Pt has been re-evaluated prior to consult and VSS, NAD, heart RRR, pain 0/10, lungs CTAB. No acute abnormalities found on EKG and first round of cardiac enzymes negative. This case was discussed with Dr. Eulis Foster who has seen the patient and agrees with plan to admit.      Elwyn Lade, PA-C 08/29/13 1920

## 2013-08-29 NOTE — ED Notes (Signed)
PT returned from Xray 

## 2013-08-29 NOTE — ED Notes (Signed)
PT reports left sided chest pain since discharge on Feb 9th. States is it worse with exertion and SOB/diaphoresis. Pt reports "a lot of relief" with nitro, now rates 5/10. Denies radiation. NAD.

## 2013-08-29 NOTE — ED Notes (Addendum)
Received call from lab that blood hemolyzed. EDPA notified. Phlebotomy and IV team notified to attempt draw. Pt continues to refuse External Jugular attempt by EDP

## 2013-08-30 ENCOUNTER — Observation Stay (HOSPITAL_COMMUNITY): Payer: Medicare PPO

## 2013-08-30 LAB — GLUCOSE, CAPILLARY
GLUCOSE-CAPILLARY: 229 mg/dL — AB (ref 70–99)
GLUCOSE-CAPILLARY: 226 mg/dL — AB (ref 70–99)
GLUCOSE-CAPILLARY: 334 mg/dL — AB (ref 70–99)
Glucose-Capillary: 338 mg/dL — ABNORMAL HIGH (ref 70–99)
Glucose-Capillary: 224 mg/dL — ABNORMAL HIGH (ref 70–99)

## 2013-08-30 LAB — TROPONIN I

## 2013-08-30 LAB — BASIC METABOLIC PANEL
BUN: 12 mg/dL (ref 6–23)
CALCIUM: 9.1 mg/dL (ref 8.4–10.5)
CHLORIDE: 101 meq/L (ref 96–112)
CO2: 22 mEq/L (ref 19–32)
CREATININE: 0.99 mg/dL (ref 0.50–1.35)
GFR calc Af Amer: 87 mL/min — ABNORMAL LOW (ref >90)
GFR calc non Af Amer: 75 mL/min — ABNORMAL LOW (ref >90)
Glucose, Bld: 228 mg/dL — ABNORMAL HIGH (ref 70–99)
Potassium: 4.4 mEq/L (ref 3.7–5.3)
Sodium: 137 mEq/L (ref 137–147)

## 2013-08-30 LAB — HEMOGLOBIN A1C
Hgb A1c MFr Bld: 10.6 % — ABNORMAL HIGH (ref <5.7)
MEAN PLASMA GLUCOSE: 258 mg/dL — AB (ref <117)

## 2013-08-30 LAB — PROTIME-INR
INR: 1 (ref 0.00–1.49)
Prothrombin Time: 13 seconds (ref 11.6–15.2)

## 2013-08-30 LAB — CBC
HEMATOCRIT: 47.8 % (ref 39.0–52.0)
Hemoglobin: 16.8 g/dL (ref 13.0–17.0)
MCH: 31.8 pg (ref 26.0–34.0)
MCHC: 35.1 g/dL (ref 30.0–36.0)
MCV: 90.5 fL (ref 78.0–100.0)
PLATELETS: 149 10*3/uL — AB (ref 150–400)
RBC: 5.28 MIL/uL (ref 4.22–5.81)
RDW: 14.2 % (ref 11.5–15.5)
WBC: 6 10*3/uL (ref 4.0–10.5)

## 2013-08-30 LAB — LIPID PANEL
CHOL/HDL RATIO: 4.8 ratio
CHOLESTEROL: 227 mg/dL — AB (ref 0–200)
HDL: 47 mg/dL (ref >39)
LDL Cholesterol: 148 mg/dL — ABNORMAL HIGH (ref 0–99)
Triglycerides: 158 mg/dL — ABNORMAL HIGH (ref <150)
VLDL: 32 mg/dL (ref 0–40)

## 2013-08-30 LAB — MRSA PCR SCREENING: MRSA by PCR: NEGATIVE

## 2013-08-30 MED ORDER — TECHNETIUM TC 99M SESTAMIBI - CARDIOLITE
30.0000 | Freq: Once | INTRAVENOUS | Status: AC | PRN
  Administered 2013-08-30: 30 via INTRAVENOUS

## 2013-08-30 MED ORDER — ENOXAPARIN SODIUM 120 MG/0.8ML ~~LOC~~ SOLN
120.0000 mg | Freq: Two times a day (BID) | SUBCUTANEOUS | Status: DC
  Administered 2013-08-30 – 2013-08-31 (×2): 120 mg via SUBCUTANEOUS
  Filled 2013-08-30 (×4): qty 0.8

## 2013-08-30 MED ORDER — SODIUM CHLORIDE 0.9 % IJ SOLN: 10.0000 mL | Freq: Two times a day (BID) | INTRAMUSCULAR | Status: DC

## 2013-08-30 MED ORDER — REGADENOSON 0.4 MG/5ML IV SOLN
0.4000 mg | Freq: Once | INTRAVENOUS | Status: AC
  Administered 2013-08-30: 0.4 mg via INTRAVENOUS

## 2013-08-30 MED ORDER — INSULIN ASPART PROT & ASPART (70-30 MIX) 100 UNIT/ML ~~LOC~~ SUSP
50.0000 [IU] | Freq: Two times a day (BID) | SUBCUTANEOUS | Status: DC
  Administered 2013-08-30: 50 [IU] via SUBCUTANEOUS
  Administered 2013-08-31: 25 [IU] via SUBCUTANEOUS
  Filled 2013-08-30: qty 10

## 2013-08-30 MED ORDER — REGADENOSON 0.4 MG/5ML IV SOLN
INTRAVENOUS | Status: AC
  Filled 2013-08-30: qty 5

## 2013-08-30 MED ORDER — ENOXAPARIN SODIUM 120 MG/0.8ML ~~LOC~~ SOLN
120.0000 mg | Freq: Two times a day (BID) | SUBCUTANEOUS | Status: DC
  Filled 2013-08-30 (×3): qty 0.8

## 2013-08-30 MED ORDER — TECHNETIUM TC 99M SESTAMIBI - CARDIOLITE
10.0000 | Freq: Once | INTRAVENOUS | Status: AC | PRN
  Administered 2013-08-30: 10 via INTRAVENOUS

## 2013-08-30 MED ORDER — SODIUM CHLORIDE 0.9 % IJ SOLN
10.0000 mL | INTRAMUSCULAR | Status: DC | PRN
  Administered 2013-08-30 – 2013-08-31 (×2): 30 mL

## 2013-08-30 MED ORDER — HYDROCODONE-ACETAMINOPHEN 5-325 MG PO TABS
1.0000 | ORAL_TABLET | Freq: Every day | ORAL | Status: DC
  Administered 2013-08-30: 1 via ORAL
  Filled 2013-08-30 (×2): qty 1

## 2013-08-30 NOTE — Progress Notes (Signed)
ANTICOAGULATION CONSULT NOTE - Initial Consult  Pharmacy Consult for Lovenox Indication: chest pain/ACS  No Known Allergies Patient Measurements: Height: 5\' 10"  (177.8 cm) Weight: 270 lb 6.4 oz (122.653 kg) IBW/kg (Calculated) : 73 Vital Signs: Temp: 97.3 F (36.3 C) (02/28 0630) Temp src: Oral (02/28 0630) BP: 123/81 mmHg (02/28 0630) Pulse Rate: 94 (02/28 0630) Labs:  Recent Labs  08/29/13 1815 08/30/13 0012 08/30/13 0535  HGB 16.8  --  16.8  HCT 47.9  --  47.8  PLT 167  --  149*  LABPROT  --   --  13.0  INR  --   --  1.00  CREATININE 1.09  --  0.99  TROPONINI <0.30 <0.30  --    Estimated Creatinine Clearance: 76.9 ml/min (by C-G formula based on Cr of 0.99).   Medical History: Past Medical History  Diagnosis Date  . Diabetes mellitus   . Hypertension   . Obesity   . H/O hiatal hernia   . Arthritis   . Hyperlipidemia   . Neuropathy    Assessment: 80 YO obese male admitted for ACS initially to start on IV heparin however unable to place IV access and patient is refusing central line. Dr. Doylene Canard gave telephone order to change to Lovenox. Stress test planned for today. Troponin negative so far. H/H within normal limits. Platelets with mild decrease to 149. SCr is stable.   Goal of Therapy:  Anti-Xa level 0.6-1 units/ml 4hrs after LMWH dose given Monitor platelets by anticoagulation protocol: Yes   Plan:  1. Lovenox 120mg  sq q12h (~1mg /kg). 2. If continues on Lovenox- will consider anti-Xa level to confirm appropriate dosing.  3. CBC q72h while on therapy.   Sloan Leiter, PharmD, BCPS Clinical Pharmacist [phone] 08/30/2013,9:04 AM

## 2013-08-30 NOTE — Progress Notes (Signed)
Pt's iv infiltrated that was placed with ultrasound. Iv team and staff RN unable to get iv access. Pt is refusing central line. Dr Doylene Canard notified of being unable to give IV heparin. No new orders. Will continue to monitor.

## 2013-08-30 NOTE — Progress Notes (Signed)
Peripherally Inserted Central Catheter/Midline Placement  The IV Nurse has discussed with the patient and/or persons authorized to consent for the patient, the purpose of this procedure and the potential benefits and risks involved with this procedure.  The benefits include less needle sticks, lab draws from the catheter and patient may be discharged home with the catheter.  Risks include, but not limited to, infection, bleeding, blood clot (thrombus formation), and puncture of an artery; nerve damage and irregular heat beat.  Alternatives to this procedure were also discussed.  PICC/Midline Placement Documentation  PICC / Midline Double Lumen 50/35/46 PICC Right Basilic 45 cm 3 cm (Active)  Indication for Insertion or Continuance of Line Poor Vasculature-patient has had multiple peripheral attempts or PIVs lasting less than 24 hours 08/30/2013 11:15 AM  Exposed Catheter (cm) 3 cm 08/30/2013 11:15 AM  Lumen #1 Status Flushed;Saline locked;Blood return noted 08/30/2013 11:15 AM  Lumen #2 Status Flushed;Saline locked;Blood return noted 08/30/2013 11:15 AM  Dressing Intervention New dressing 08/30/2013 11:15 AM  Dressing Change Due 09/06/13 08/30/2013 11:15 AM       Gordan Payment 08/30/2013, 11:27 AM

## 2013-08-30 NOTE — Progress Notes (Signed)
Utilization Review Completed.   Tarron Krolak, RN, BSN Nurse Case Manager  

## 2013-08-30 NOTE — Progress Notes (Signed)
Subjective:  Has sleep problem for quite some time. Passed nuclear stress test (No reversible ischemia) with global hypokinesia and EF 42 %  Objective:  Vital Signs in the last 24 hours: Temp:  [97.3 F (36.3 C)-98.6 F (37 C)] 97.3 F (36.3 C) (02/28 0630) Pulse Rate:  [78-104] 99 (02/28 1321) Cardiac Rhythm:  [-] Normal sinus rhythm;Heart block (02/28 0730) Resp:  [14-22] 18 (02/28 0630) BP: (84-134)/(43-96) 84/55 mmHg (02/28 1321) SpO2:  [92 %-99 %] 94 % (02/28 0630) Weight:  [122.653 kg (270 lb 6.4 oz)] 122.653 kg (270 lb 6.4 oz) (02/27 2053)  Physical Exam: BP Readings from Last 1 Encounters:  08/30/13 84/55     Wt Readings from Last 1 Encounters:  08/29/13 122.653 kg (270 lb 6.4 oz)    Weight change:   HEENT: [place]/AT, Eyes-Brown, PERL, EOMI, Conjunctiva-Pink, Sclera-Non-icteric Neck: No JVD, No bruit, Trachea midline. Lungs:  Clear, Bilateral. Cardiac:  Regular rhythm, normal S1 and S2, no S3.  Abdomen:  Soft, non-tender. Extremities:  No edema present. No cyanosis. No clubbing. CNS: AxOx3, Cranial nerves grossly intact, moves all 4 extremities. Right handed. Skin: Warm and dry.   Intake/Output from previous day: 02/27 0701 - 02/28 0700 In: 3 [I.V.:3] Out: 400 [Urine:400]    Lab Results: BMET    Component Value Date/Time   NA 137 08/30/2013 0535   K 4.4 08/30/2013 0535   CL 101 08/30/2013 0535   CO2 22 08/30/2013 0535   GLUCOSE 228* 08/30/2013 0535   BUN 12 08/30/2013 0535   CREATININE 0.99 08/30/2013 0535   CREATININE 1.04 05/28/2013 1127   CALCIUM 9.1 08/30/2013 0535   GFRNONAA 75* 08/30/2013 0535   GFRAA 87* 08/30/2013 0535   CBC    Component Value Date/Time   WBC 6.0 08/30/2013 0535   RBC 5.28 08/30/2013 0535   HGB 16.8 08/30/2013 0535   HCT 47.8 08/30/2013 0535   PLT 149* 08/30/2013 0535   MCV 90.5 08/30/2013 0535   MCH 31.8 08/30/2013 0535   MCHC 35.1 08/30/2013 0535   RDW 14.2 08/30/2013 0535   LYMPHSABS 1.9 08/11/2013 1152   MONOABS 0.6 08/11/2013 1152   EOSABS 0.1 08/11/2013 1152   BASOSABS 0.0 08/11/2013 1152   CARDIAC ENZYMES Lab Results  Component Value Date   CKTOTAL 89 01/01/2012   CKMB 2.2 01/01/2012   TROPONINI <0.30 08/30/2013    Scheduled Meds: . aspirin  324 mg Oral NOW   Or  . aspirin  300 mg Rectal NOW  . aspirin EC  81 mg Oral Daily  . doxazosin  2 mg Oral Daily  . enoxaparin (LOVENOX) injection  120 mg Subcutaneous Q12H  . furosemide  40 mg Oral Daily  . gabapentin  300 mg Oral QHS  . HYDROcodone-acetaminophen  1 tablet Oral QHS  . insulin aspart  0-20 Units Subcutaneous TID WC  . insulin aspart protamine- aspart  45 Units Subcutaneous BID WC  . metoprolol tartrate  25 mg Oral BID  . regadenoson      . sodium chloride  10-40 mL Intracatheter Q12H  . sodium chloride  3 mL Intravenous Q12H   Continuous Infusions:  PRN Meds:.sodium chloride, acetaminophen, nitroGLYCERIN, ondansetron (ZOFRAN) IV, sodium chloride, sodium chloride  Assessment/Plan: Angina  Hypertension  DM, II  Dyslipidemia  Morbid Obesity Anxiety Insomnia  2-D echo for low EF on nuclear images. Increase activity as tolerated. Discussed diet and exercise to improve sugar control. Increase Insulin.   LOS: 1 day    Dixie Dials  MD  08/30/2013, 4:43 PM

## 2013-08-31 LAB — GLUCOSE, CAPILLARY
Glucose-Capillary: 121 mg/dL — ABNORMAL HIGH (ref 70–99)
Glucose-Capillary: 202 mg/dL — ABNORMAL HIGH (ref 70–99)

## 2013-08-31 LAB — CBC
HEMATOCRIT: 43.5 % (ref 39.0–52.0)
Hemoglobin: 15.1 g/dL (ref 13.0–17.0)
MCH: 31.6 pg (ref 26.0–34.0)
MCHC: 34.7 g/dL (ref 30.0–36.0)
MCV: 91 fL (ref 78.0–100.0)
Platelets: 131 10*3/uL — ABNORMAL LOW (ref 150–400)
RBC: 4.78 MIL/uL (ref 4.22–5.81)
RDW: 14.3 % (ref 11.5–15.5)
WBC: 7.4 10*3/uL (ref 4.0–10.5)

## 2013-08-31 MED ORDER — CYCLOBENZAPRINE HCL 5 MG PO TABS: 5.0000 mg | ORAL_TABLET | Freq: Every day | ORAL | Status: DC

## 2013-08-31 MED ORDER — HYDROCODONE-ACETAMINOPHEN 10-325 MG PO TABS: 1.0000 | ORAL_TABLET | Freq: Two times a day (BID) | ORAL | Status: DC

## 2013-08-31 MED ORDER — NITROGLYCERIN 0.4 MG SL SUBL: 0.4000 mg | SUBLINGUAL_TABLET | SUBLINGUAL | Status: AC | PRN

## 2013-08-31 MED ORDER — ENOXAPARIN SODIUM 120 MG/0.8ML ~~LOC~~ SOLN
115.0000 mg | Freq: Two times a day (BID) | SUBCUTANEOUS | Status: DC
Start: 2013-08-31 — End: 2013-08-31
  Filled 2013-08-31: qty 0.8

## 2013-08-31 MED ORDER — HYDROCODONE-ACETAMINOPHEN 5-325 MG PO TABS
1.0000 | ORAL_TABLET | Freq: Once | ORAL | Status: AC
  Administered 2013-08-31: 1 via ORAL

## 2013-08-31 MED ORDER — LISINOPRIL 2.5 MG PO TABS: 2.5000 mg | ORAL_TABLET | Freq: Every day | ORAL | Status: DC

## 2013-08-31 MED ORDER — ENOXAPARIN SODIUM 120 MG/0.8ML ~~LOC~~ SOLN
115.0000 mg | Freq: Two times a day (BID) | SUBCUTANEOUS | Status: DC
  Filled 2013-08-31 (×2): qty 0.8

## 2013-08-31 MED ORDER — PRAVASTATIN SODIUM 20 MG PO TABS: 20.0000 mg | ORAL_TABLET | Freq: Every day | ORAL | Status: DC

## 2013-08-31 NOTE — Progress Notes (Signed)
  Echocardiogram 2D Echocardiogram has been performed.  [place], [facility] 08/31/2013, 9:28 AM

## 2013-08-31 NOTE — Progress Notes (Signed)
ANTICOAGULATION CONSULT NOTE - Follow Up Consult  Pharmacy Consult for Lovenox Indication: chest pain/ACS No Known Allergies Patient Measurements: Height: 5\' 10"  (177.8 cm) Weight: 247 lb 14.7 oz (112.455 kg) IBW/kg (Calculated) : 73 Vital Signs: Temp: 98.1 F (36.7 C) (03/01 0609) Temp src: Oral (03/01 0609) BP: 95/66 mmHg (03/01 0609) Pulse Rate: 77 (03/01 0609) Labs:  Recent Labs  08/29/13 1815 08/30/13 0012 08/30/13 0535 08/30/13 1545 08/31/13 0415  HGB 16.8  --  16.8  --  15.1  HCT 47.9  --  47.8  --  43.5  PLT 167  --  149*  --  131*  LABPROT  --   --  13.0  --   --   INR  --   --  1.00  --   --   CREATININE 1.09  --  0.99  --   --   TROPONINI <0.30 <0.30  --  <0.30  --    Estimated Creatinine Clearance: 73.5 ml/min (by C-G formula based on Cr of 0.99).  Assessment: 83 YOM on Lovenox for ACS. Passed stress test. Weight corrected down to 112.5kg. H/H stable. Plts trending down at 131. No bleeding reported.   Goal of Therapy:  Anti-Xa level 0.6-1 units/ml 4hrs after LMWH dose given Monitor platelets by anticoagulation protocol: Yes   Plan:  Adjust Lovenox to 115mg  SQ q12h.  Monitor renal function and platelets closely.  Follow-up duration of therapy -MD: does patient still need full dose or just DVT prophylaxis?   Sloan Leiter, PharmD, BCPS Clinical Pharmacist [phone] 08/31/2013,10:28 AM

## 2013-08-31 NOTE — Discharge Summary (Signed)
Physician Discharge Summary  Patient ID: [name] MRN: [id number] DOB/AGE: October 31, 1929 80 y.o.  Admit date: 08/29/2013 Discharge date: 08/31/2013  Admission Diagnoses: Angina  Hypertension  DM, II  Dyslipidemia  Morbid Obesity  Anxiety  Insomnia  Discharge Diagnoses:  Principle Problem: * Chest pain at rest * Hypertrophic cardiomyopathy with diastolic dysfunction Hypertension  DM, II, uncontrolled Dyslipidemia  Morbid Obesity  Anxiety  Insomnia   Discharged Condition: fair  Hospital Course: 80 year old male with history of diabetes, hypertension, obesity, hyperlipidemia, neuropathy, and hiatal hernia who presents today with pressure type non-radiating chest pain since last night. It has been constant since last night and is improving. It is associated with mild shortness of breath. He had a brief episode of diaphoresis early this morning which resolved after a few minutes. His symptoms improved with SL nitroglycerin. He underwent nuclear stress test with no reversible ischemia. His echocardiogram showed moderate LVH with mild LV outflow obstruction. He was advised to control his blood sugar with diet and activity. Analgesic and NTG SL were prescribed for chest pain with follow up by primary care in 2 -7 days.  Consults: cardiology  Significant Diagnostic Studies: labs: Normal CBC and BMET except elevated glucose of 273 mg/dL. Elevated Hgb A1C of 10.6. Elevated LDL of 148 mg/dL with total of 227 mg/dL  EKG-Sinus rhythm with 1st. degree AV block.  Nuclear stress test: No reversible ischemia. Global hypokinesia EF 42 %.   Echocardiogram: Good LV systolic function with EF 65 %. Moderate LVH and mild diastolic dysfunction.  Treatments: analgesia: Vicodin and cardiac meds: lisinopril (Zestril), metoprolol,  NTG and Pravachol  Discharge Exam: Blood pressure 128/67, pulse 87, temperature 98.1 F (36.7 C), temperature source Oral, resp. rate 20, height 5\' 10"  (1.778 m), weight  112.455 kg (247 lb 14.7 oz), SpO2 97.00%. HEENT: [place]/AT, Eyes-Brown, EOMI, Conjunctiva-Pink, Sclera-Non-icteric  Neck: No JVD, No bruit, Trachea midline.  Lungs: Clear, Bilateral. Chest wall tender on left precordium on palpation. Cardiac: Regular rhythm, normal S1 and S2, no S3.  Abdomen: Soft, non-tender.  Extremities: No edema present. No cyanosis. No clubbing.  CNS: AxOx3, Cranial nerves grossly intact, moves all 4 extremities. Right handed.  Skin: Warm and dry.   Disposition: 01-Home or Self Care   Future Appointments Provider Department Dept Phone   09/11/2013 10:45 AM Angelica Chessman, MD [place] [phone]       Medication List         aspirin EC 81 MG tablet  Take 1 tablet (81 mg total) by mouth daily.     cyclobenzaprine 5 MG tablet  Commonly known as:  FLEXERIL  Take 1 tablet (5 mg total) by mouth at bedtime.     doxazosin 2 MG tablet  Commonly known as:  CARDURA  Take 2 mg by mouth daily.     furosemide 40 MG tablet  Commonly known as:  LASIX  Take 1 tablet (40 mg total) by mouth daily.     gabapentin 300 MG capsule  Commonly known as:  NEURONTIN  Take 300 mg by mouth at bedtime.     HYDROcodone-acetaminophen 10-325 MG per tablet  Commonly known as:  NORCO  Take 1 tablet by mouth 2 (two) times daily. For pain     insulin NPH-regular Human (70-30) 100 UNIT/ML injection  Commonly known as:  NOVOLIN 70/30  Inject 45 Units into the skin 2 (two) times daily with a meal.     metoprolol tartrate 25 MG tablet  Commonly known as:  LOPRESSOR  Take 1 tablet (25 mg total) by mouth 2 (two) times daily.     nitroGLYCERIN 0.4 MG SL tablet  Commonly known as:  NITROSTAT  Place 1 tablet (0.4 mg total) under the tongue every 5 (five) minutes x 3 doses as needed for chest pain.     Vitamin D (Ergocalciferol) 50000 UNITS Caps capsule  Commonly known as:  DRISDOL  Take 1 capsule (50,000 Units total) by mouth every 7 (seven) days.            Follow-up Information   Follow up with Angelica Chessman, MD. Schedule an appointment as soon as possible for a visit in 2 days.   Specialty:  Internal Medicine   Contact information:   [address] [place] [address] [phone]       Follow up with Reyne Dumas, MD In 1 week. (As needed)    Specialty:  Internal Medicine   Contact information:   [place] [address] [phone]       Signed: Birdie Riddle 08/31/2013, 11:38 AM

## 2013-09-11 ENCOUNTER — Ambulatory Visit: Payer: Medicare PPO | Attending: Internal Medicine | Admitting: Family Medicine

## 2013-09-11 VITALS — BP 124/84 | HR 74 | Temp 98.0°F | Resp 16 | Ht 70.0 in | Wt 270.0 lb

## 2013-09-11 DIAGNOSIS — E119 Type 2 diabetes mellitus without complications: Secondary | ICD-10-CM | POA: Insufficient documentation

## 2013-09-11 DIAGNOSIS — K089 Disorder of teeth and supporting structures, unspecified: Secondary | ICD-10-CM | POA: Insufficient documentation

## 2013-09-11 DIAGNOSIS — K0889 Other specified disorders of teeth and supporting structures: Secondary | ICD-10-CM

## 2013-09-11 LAB — GLUCOSE, POCT (MANUAL RESULT ENTRY): POC GLUCOSE: 193 mg/dL — AB (ref 70–99)

## 2013-09-11 LAB — POCT GLYCOSYLATED HEMOGLOBIN (HGB A1C): HEMOGLOBIN A1C: 9.8

## 2013-09-11 NOTE — Progress Notes (Signed)
[facility] Dental referral Following up on his diabetes.

## 2013-09-11 NOTE — Progress Notes (Signed)
   Subjective:    Patient ID: [name], male    DOB: Jul 28, 1929, 80 y.o.   MRN: [id number]  HPI [name] is an 80 y.o. man here for a follow up.  He would like a dental referral. .He hasan oral infection that was diagnosed by his dentist and is currently on penicillin. His dentist reccommended a referral to an oral surgeon. However, the oral surgeon will not see him because he does not have an orange card.   He also is following up on DM. His diabetes is not well controlled on current therapy. His blood glucose and hemoglobin A1C (9.8) are elevated. He is also having numbness and tingling in his lower extremities. Patient did not bring medications with him to clinic today, and he is not sure what medications he is currently taking for diabetes control.  Review of Systems  Constitutional: Negative.   HENT: Positive for dental problem.   Eyes: Negative.   Respiratory: Negative.   Cardiovascular: Negative.   Gastrointestinal: Negative.   Endocrine: Negative.   Genitourinary: Negative.   Musculoskeletal: Negative.   Skin: Negative.   Allergic/Immunologic: Negative.   Neurological: Positive for numbness.  Hematological: Negative.   Psychiatric/Behavioral: Negative.        Objective:   Physical Exam  Constitutional: He is oriented to person, place, and time. He appears well-developed and well-nourished.  HENT:  Head: Normocephalic.  Mouth/Throat:    Mouth - The patient has multiple missing teeth. He has no front right tooth and there is a hole that is red, swollen and appears infected.  Neck: Normal range of motion. Neck supple.  Cardiovascular: Normal rate and regular rhythm.   Pulmonary/Chest: Effort normal and breath sounds normal.  Abdominal: Soft.  Neurological: He is alert and oriented to person, place, and time.  Skin: Skin is warm and dry.  Psychiatric: He has a normal mood and affect. Judgment and thought content normal.          Assessment & Plan:    Darly was seen today for follow-up.  Diagnoses and associated orders for this visit:  Diabetes - HgB A1c - Glucose (CBG) The pateint has been instructed to follow up as soon as an appointment is available, and to bring his medications so his diabetes therapy can be adjusted.  Pain, dental - Ambulatory referral to Oral Maxillofacial Surgery  The patient has been given clear instruction to apply for an orange card, and follow up with the oral surgeon which he was given an referral for.

## 2013-09-19 ENCOUNTER — Telehealth: Payer: Self-pay | Admitting: Internal Medicine

## 2013-09-19 NOTE — Telephone Encounter (Signed)
Pt. came in wanting a refill on hydrocodone...Reginald Kitchenper [name] pt. would have to see doctor, we don't have openings therefore pt. cannot be seen today at [facility]...Pt. Was informed of this. Pt. Has f/u appt. on 09/25/13.Reginald KitchenMarland Osborn

## 2013-09-25 ENCOUNTER — Ambulatory Visit: Payer: Medicare PPO | Admitting: Internal Medicine

## 2013-11-17 ENCOUNTER — Encounter: Payer: Self-pay | Admitting: Physical Medicine & Rehabilitation

## 2013-12-26 ENCOUNTER — Encounter: Payer: Medicare HMO | Attending: Physical Medicine & Rehabilitation

## 2013-12-26 ENCOUNTER — Encounter: Payer: Self-pay | Admitting: Physical Medicine & Rehabilitation

## 2013-12-26 ENCOUNTER — Ambulatory Visit (HOSPITAL_BASED_OUTPATIENT_CLINIC_OR_DEPARTMENT_OTHER): Payer: Commercial Managed Care - HMO | Admitting: Physical Medicine & Rehabilitation

## 2013-12-26 VITALS — BP 120/72 | HR 91 | Resp 18 | Ht 70.5 in | Wt 262.0 lb

## 2013-12-26 DIAGNOSIS — M25579 Pain in unspecified ankle and joints of unspecified foot: Secondary | ICD-10-CM

## 2013-12-26 DIAGNOSIS — E1140 Type 2 diabetes mellitus with diabetic neuropathy, unspecified: Secondary | ICD-10-CM

## 2013-12-26 DIAGNOSIS — Z79899 Other long term (current) drug therapy: Secondary | ICD-10-CM | POA: Insufficient documentation

## 2013-12-26 DIAGNOSIS — G8929 Other chronic pain: Secondary | ICD-10-CM

## 2013-12-26 DIAGNOSIS — E1149 Type 2 diabetes mellitus with other diabetic neurological complication: Secondary | ICD-10-CM

## 2013-12-26 DIAGNOSIS — M545 Low back pain, unspecified: Secondary | ICD-10-CM | POA: Insufficient documentation

## 2013-12-26 DIAGNOSIS — E785 Hyperlipidemia, unspecified: Secondary | ICD-10-CM | POA: Insufficient documentation

## 2013-12-26 DIAGNOSIS — E669 Obesity, unspecified: Secondary | ICD-10-CM | POA: Insufficient documentation

## 2013-12-26 DIAGNOSIS — E1142 Type 2 diabetes mellitus with diabetic polyneuropathy: Secondary | ICD-10-CM | POA: Insufficient documentation

## 2013-12-26 DIAGNOSIS — Z87891 Personal history of nicotine dependence: Secondary | ICD-10-CM | POA: Insufficient documentation

## 2013-12-26 DIAGNOSIS — M549 Dorsalgia, unspecified: Secondary | ICD-10-CM

## 2013-12-26 DIAGNOSIS — M961 Postlaminectomy syndrome, not elsewhere classified: Secondary | ICD-10-CM | POA: Insufficient documentation

## 2013-12-26 DIAGNOSIS — M79609 Pain in unspecified limb: Secondary | ICD-10-CM | POA: Insufficient documentation

## 2013-12-26 DIAGNOSIS — I10 Essential (primary) hypertension: Secondary | ICD-10-CM | POA: Insufficient documentation

## 2013-12-26 DIAGNOSIS — Z5181 Encounter for therapeutic drug level monitoring: Secondary | ICD-10-CM

## 2013-12-26 MED ORDER — GABAPENTIN 400 MG PO CAPS: 400.0000 mg | ORAL_CAPSULE | Freq: Three times a day (TID) | ORAL | Status: DC

## 2013-12-26 MED ORDER — TRAMADOL HCL 50 MG PO TABS: 50.0000 mg | ORAL_TABLET | Freq: Three times a day (TID) | ORAL | Status: DC

## 2013-12-26 NOTE — Patient Instructions (Signed)
Please call your primary care physician notify him of your elevated blood pressure, take your blood pressure medications when you go home  Keep blood sugars less than 200  Start new medicines including tramadol 3 times per day Start increased dose of gabapentin 400 mg 3 times per day  Return for nerve test to look at your right hand numbness

## 2013-12-26 NOTE — Progress Notes (Signed)
Subjective:    Patient ID: [name], male    DOB: 02/01/1930, 80 y.o.   MRN: [id number]  HPI Chief complaint is bilateral foot burning pain 80 year old male with history of uncontrolled diabetes has several month history of burning pain in both feet. He's been evaluated by his primary care physician. He was started on gabapentin first 300 mg each bedtime and now 300 mg 3 times a day which is not very helpful. He also tried oxycodone, prescribed by his former nephrologist and has tried hydrocodone prescribed by his primary care physician. . The hydrocodone does not seem to help very much.  His pain is worse at night. He also has some numbness in the right index finger. He does not have any significant neck pain. He has a history of chronic low back pain. He's had a total of 4 back surgeries 2 of them performed in the 1960s in 2 of them performed in the 1980s. His back pain is not severe at the current time  Pain Inventory Average Pain 5 Pain Right Now 5 My pain is burning, stabbing and tingling  In the last 24 hours, has pain interfered with the following? General activity 8 Relation with others 8 Enjoyment of life 8 What TIME of day is your pain at its worst? night Sleep (in general) Fair  Pain is worse with: some activites Pain improves with: therapy/exercise Relief from Meds: 8  Mobility walk without assistance walk with assistance use a cane how many minutes can you walk? 30 ability to climb steps?  yes do you drive?  yes Do you have any goals in this area?  no  Function employed # of hrs/week no not employed: date last employed 1986 I need assistance with the following:  bathing, meal prep and shopping Do you have any goals in this area?  no  Neuro/Psych bowel control problems weakness numbness tingling trouble walking  Prior Studies Any changes since last visit?  no  Physicians involved in your care Any changes since last visit?  no   Family  History  Problem Relation Age of Onset  . Cancer Mother    History   Social History  . Marital Status: Widowed    Spouse Name: N/A    Number of Children: N/A  . Years of Education: N/A   Social History Main Topics  . Smoking status: Former Smoker    Types: Cigarettes    Quit date: 12/30/1996  . Smokeless tobacco: Never Used  . Alcohol Use: No  . Drug Use: No  . Sexual Activity: None   Other Topics Concern  . None   Social History Narrative  . None   Past Surgical History  Procedure Laterality Date  . Abdominal surgery      pt has abdominal scarring and states he had sx but not sure what kind  . Back surgery    . Cholecystectomy     Past Medical History  Diagnosis Date  . Diabetes mellitus   . Hypertension   . Obesity   . H/O hiatal hernia   . Arthritis   . Hyperlipidemia   . Neuropathy    BP 162/122  Pulse 91  Resp 18  Ht 5' 10.5" (1.791 m)  Wt 262 lb (118.842 kg)  BMI 37.05 kg/m2  SpO2 96%  Opioid Risk Score:  4 Fall Risk Score: High Fall Risk (>13 points) (patient educated handout declined)   Review of Systems  Constitutional: Positive for unexpected weight change.  Respiratory: Positive for shortness of breath.   Gastrointestinal: Positive for constipation.  Musculoskeletal: Positive for gait problem.  Neurological: Positive for weakness and numbness.  All other systems reviewed and are negative.      Objective:   Physical Exam  Nursing note and vitals reviewed. Constitutional: He is oriented to person, place, and time. He appears well-developed.  Obese  HENT:  Head: Atraumatic.  Eyes: Conjunctivae and EOM are normal. Pupils are equal, round, and reactive to light.  Neck: Normal range of motion.  Abdominal:  Midline abdominal incision well healed   Musculoskeletal:       Lumbar back: He exhibits decreased range of motion. He exhibits no tenderness, no pain and no spasm.  Neurological: He is alert and oriented to person, place, and  time. He has normal strength. A sensory deficit is present. Gait normal.  Reflex Scores:      Tricep reflexes are 1+ on the right side and 1+ on the left side.      Bicep reflexes are 1+ on the right side and 1+ on the left side.      Brachioradialis reflexes are 1+ on the right side and 1+ on the left side.      Patellar reflexes are 0 on the right side and 0 on the left side.      Achilles reflexes are 0 on the right side and 0 on the left side. Absent sensation to pinprick below the ankles bilaterally Absent proprioception in bilateral great toes Proprioception normal at the ankles bilaterally Proprioception normal bilateral thumbs Decreased to pinprick in the right index middle and small finger Normal pinprick sensation in the left hand  Psychiatric: He has a normal mood and affect.          Assessment & Plan:  1. 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 He is on a small to moderate dose of gabapentin. Not having much relief with this. Increase to 400 mg 3 times a day  He does not get much relief from hydrocodone. He did not tolerate oxycodone. Given his age, comorbidities as well as primary neurogenic pain, would recommend tramadol 50 mg 3 times a day  We discussed treatment options. We discussed the importance of type diabetic control. We discussed that no interventional procedures are likely to be very helpful. We also discussed that this is a chronic condition.  2. Right hand pain does not appear to be polyneuropathy I suspect he may have a compressive neuropathy of either the median or ulnar nerve or possibly both. Will check with EMG and CV  3. Lumbar postlaminectomy syndrome I do not think his lower extremity symptoms are related to this. He does have some chronic low back pain but this is not a major complaints at the current time. May benefit from therapy if this flares up

## 2014-01-20 ENCOUNTER — Emergency Department (HOSPITAL_COMMUNITY)
Admission: EM | Admit: 2014-01-20 | Discharge: 2014-01-20 | Disposition: A | Discharge status: Home / Self Care | Payer: Medicare HMO | Attending: Emergency Medicine | Admitting: Emergency Medicine

## 2014-01-20 ENCOUNTER — Emergency Department (HOSPITAL_COMMUNITY): Payer: Medicare HMO

## 2014-01-20 ENCOUNTER — Encounter (HOSPITAL_COMMUNITY): Payer: Self-pay | Admitting: Emergency Medicine

## 2014-01-20 DIAGNOSIS — Z79899 Other long term (current) drug therapy: Secondary | ICD-10-CM | POA: Insufficient documentation

## 2014-01-20 DIAGNOSIS — Z794 Long term (current) use of insulin: Secondary | ICD-10-CM | POA: Insufficient documentation

## 2014-01-20 DIAGNOSIS — Z7982 Long term (current) use of aspirin: Secondary | ICD-10-CM | POA: Insufficient documentation

## 2014-01-20 DIAGNOSIS — M129 Arthropathy, unspecified: Secondary | ICD-10-CM | POA: Insufficient documentation

## 2014-01-20 DIAGNOSIS — R634 Abnormal weight loss: Secondary | ICD-10-CM

## 2014-01-20 DIAGNOSIS — I10 Essential (primary) hypertension: Secondary | ICD-10-CM | POA: Insufficient documentation

## 2014-01-20 DIAGNOSIS — R110 Nausea: Secondary | ICD-10-CM

## 2014-01-20 DIAGNOSIS — G589 Mononeuropathy, unspecified: Secondary | ICD-10-CM | POA: Insufficient documentation

## 2014-01-20 DIAGNOSIS — Z8719 Personal history of other diseases of the digestive system: Secondary | ICD-10-CM | POA: Insufficient documentation

## 2014-01-20 DIAGNOSIS — Z8669 Personal history of other diseases of the nervous system and sense organs: Secondary | ICD-10-CM | POA: Insufficient documentation

## 2014-01-20 DIAGNOSIS — R079 Chest pain, unspecified: Secondary | ICD-10-CM | POA: Insufficient documentation

## 2014-01-20 DIAGNOSIS — R519 Headache, unspecified: Secondary | ICD-10-CM

## 2014-01-20 DIAGNOSIS — R630 Anorexia: Secondary | ICD-10-CM | POA: Insufficient documentation

## 2014-01-20 DIAGNOSIS — E119 Type 2 diabetes mellitus without complications: Secondary | ICD-10-CM | POA: Insufficient documentation

## 2014-01-20 DIAGNOSIS — E669 Obesity, unspecified: Secondary | ICD-10-CM | POA: Insufficient documentation

## 2014-01-20 DIAGNOSIS — R51 Headache: Secondary | ICD-10-CM | POA: Insufficient documentation

## 2014-01-20 DIAGNOSIS — R112 Nausea with vomiting, unspecified: Secondary | ICD-10-CM | POA: Insufficient documentation

## 2014-01-20 DIAGNOSIS — Z87891 Personal history of nicotine dependence: Secondary | ICD-10-CM | POA: Insufficient documentation

## 2014-01-20 DIAGNOSIS — R42 Dizziness and giddiness: Secondary | ICD-10-CM | POA: Insufficient documentation

## 2014-01-20 DIAGNOSIS — E785 Hyperlipidemia, unspecified: Secondary | ICD-10-CM | POA: Insufficient documentation

## 2014-01-20 LAB — URINALYSIS, ROUTINE W REFLEX MICROSCOPIC
Bilirubin Urine: NEGATIVE
Glucose, UA: 100 mg/dL — AB
Ketones, ur: NEGATIVE mg/dL
LEUKOCYTES UA: NEGATIVE
NITRITE: NEGATIVE
PH: 5 (ref 5.0–8.0)
Protein, ur: NEGATIVE mg/dL
SPECIFIC GRAVITY, URINE: 1.023 (ref 1.005–1.030)
Urobilinogen, UA: 0.2 mg/dL (ref 0.0–1.0)

## 2014-01-20 LAB — HEPATIC FUNCTION PANEL
ALT: 18 U/L (ref 0–53)
AST: 17 U/L (ref 0–37)
Albumin: 3 g/dL — ABNORMAL LOW (ref 3.5–5.2)
Alkaline Phosphatase: 77 U/L (ref 39–117)
TOTAL PROTEIN: 7.5 g/dL (ref 6.0–8.3)
Total Bilirubin: 0.4 mg/dL (ref 0.3–1.2)

## 2014-01-20 LAB — CBC
HCT: 50.1 % (ref 39.0–52.0)
Hemoglobin: 16.8 g/dL (ref 13.0–17.0)
MCH: 31.1 pg (ref 26.0–34.0)
MCHC: 33.5 g/dL (ref 30.0–36.0)
MCV: 92.6 fL (ref 78.0–100.0)
PLATELETS: 167 10*3/uL (ref 150–400)
RBC: 5.41 MIL/uL (ref 4.22–5.81)
RDW: 15.2 % (ref 11.5–15.5)
WBC: 8.4 10*3/uL (ref 4.0–10.5)

## 2014-01-20 LAB — URINE MICROSCOPIC-ADD ON

## 2014-01-20 LAB — TROPONIN I: Troponin I: <0.3 ng/mL (ref <0.30)

## 2014-01-20 LAB — I-STAT CG4 LACTIC ACID, ED: LACTIC ACID, VENOUS: 1.19 mmol/L (ref 0.5–2.2)

## 2014-01-20 MED ORDER — ONDANSETRON 4 MG PO TBDP
4.0000 mg | ORAL_TABLET | Freq: Once | ORAL | Status: AC
  Administered 2014-01-20: 4 mg via ORAL
  Filled 2014-01-20: qty 1

## 2014-01-20 MED ORDER — ONDANSETRON HCL 4 MG/2ML IJ SOLN
4.0000 mg | Freq: Once | INTRAMUSCULAR | Status: DC
  Filled 2014-01-20: qty 2

## 2014-01-20 NOTE — ED Notes (Signed)
He states "ive had a headache, no energy, and no appetite all week."

## 2014-01-20 NOTE — ED Provider Notes (Signed)
CSN: [id number]     Arrival date & time 01/20/14  1214 History   First MD Initiated Contact with Patient 01/20/14 1558     Chief Complaint  Patient presents with  . Headache     (Consider location/radiation/quality/duration/timing/severity/associated sxs/prior Treatment) HPI  This is an 80 year old male with a history diabetes, hypertension, hyperlipidemia who presents with multiple complaints. Patient reports weight loss, headache, decreased energy and poor by mouth intake. He states that this has been for "weeks."  He states that he has had no appetite and feels nauseated. Denies any overt abdominal pain. Patient also reports that he had onset of vomiting this morning. It was nonbloody, nonbilious. Patient reports a 30 pound weight loss over the last 2 months. He states that this morning he had onset of chest pain which is central and sharp in nature. Denies any shortness of breath. Does endorse dizziness. Denies room spinning dizziness. No history of cancer but is a former smoker.  Past Medical History  Diagnosis Date  . Diabetes mellitus   . Hypertension   . Obesity   . H/O hiatal hernia   . Arthritis   . Hyperlipidemia   . Neuropathy    Past Surgical History  Procedure Laterality Date  . Abdominal surgery      pt has abdominal scarring and states he had sx but not sure what kind  . Back surgery    . Cholecystectomy     Family History  Problem Relation Age of Onset  . Cancer Mother    History  Substance Use Topics  . Smoking status: Former Smoker    Types: Cigarettes    Quit date: 12/30/1996  . Smokeless tobacco: Never Used  . Alcohol Use: No    Review of Systems  Constitutional: Positive for appetite change. Negative for fever and chills.       Weight loss  Respiratory: Positive for chest tightness. Negative for shortness of breath.   Cardiovascular: Negative.  Negative for chest pain.  Gastrointestinal: Positive for nausea and vomiting. Negative for abdominal  pain and diarrhea.       Anorexia  Genitourinary: Negative.  Negative for dysuria.  Musculoskeletal: Negative for back pain.  Skin: Negative for rash.  Neurological: Positive for dizziness and headaches. Negative for syncope and weakness.  Psychiatric/Behavioral: Negative for confusion.  All other systems reviewed and are negative.     Allergies  Review of patient's allergies indicates no known allergies.  Home Medications   Prior to Admission medications   Medication Sig Start Date End Date Taking? Authorizing Provider  aspirin EC 81 MG tablet Take 1 tablet (81 mg total) by mouth daily. 05/28/13   Reyne Dumas, MD  cyclobenzaprine (FLEXERIL) 5 MG tablet Take 1 tablet (5 mg total) by mouth at bedtime. 08/31/13   Birdie Riddle, MD  doxazosin (CARDURA) 2 MG tablet Take 2 mg by mouth daily. 08/21/13   Historical Provider, MD  furosemide (LASIX) 40 MG tablet Take 1 tablet (40 mg total) by mouth daily. 07/22/13   Reyne Dumas, MD  gabapentin (NEURONTIN) 400 MG capsule Take 1 capsule (400 mg total) by mouth 3 (three) times daily. 12/26/13   Charlett Blake, MD  HYDROcodone-acetaminophen (NORCO) 10-325 MG per tablet Take 1 tablet by mouth 2 (two) times daily. For pain 08/31/13   Birdie Riddle, MD  insulin NPH-regular Human (NOVOLIN 70/30) (70-30) 100 UNIT/ML injection Inject 45 Units into the skin 2 (two) times daily with a meal.    Historical  Provider, MD  lisinopril (ZESTRIL) 2.5 MG tablet Take 1 tablet (2.5 mg total) by mouth daily. 08/31/13   Birdie Riddle, MD  metoprolol tartrate (LOPRESSOR) 25 MG tablet Take 1 tablet (25 mg total) by mouth 2 (two) times daily. 05/28/13   Reyne Dumas, MD  nitroGLYCERIN (NITROSTAT) 0.4 MG SL tablet Place 1 tablet (0.4 mg total) under the tongue every 5 (five) minutes x 3 doses as needed for chest pain. 08/31/13   Birdie Riddle, MD  pravastatin (PRAVACHOL) 20 MG tablet Take 1 tablet (20 mg total) by mouth daily. 08/31/13   Birdie Riddle, MD  traMADol (ULTRAM)  50 MG tablet Take 1 tablet (50 mg total) by mouth 3 (three) times daily. 12/26/13   Charlett Blake, MD  Vitamin D, Ergocalciferol, (DRISDOL) 50000 UNITS CAPS capsule Take 1 capsule (50,000 Units total) by mouth every 7 (seven) days. 07/02/13   Reyne Dumas, MD   BP 112/87  Pulse 94  Temp(Src) 98.2 F (36.8 C) (Oral)  Resp 18  SpO2 90% Physical Exam  Nursing note and vitals reviewed. Constitutional: He is oriented to person, place, and time. No distress.  Elderly, NAD  HENT:  Head: Normocephalic and atraumatic.  Mouth/Throat: Oropharynx is clear and moist.  Eyes: EOM are normal. Pupils are equal, round, and reactive to light.  NO nystagmus noted  Neck: Neck supple.  Cardiovascular: Normal rate, regular rhythm and normal heart sounds.   No murmur heard. Pulmonary/Chest: Effort normal and breath sounds normal. No respiratory distress. He has no wheezes. He exhibits no tenderness.  Abdominal: Soft. Bowel sounds are normal. There is no tenderness. There is no rebound.  Extensive abdominal scarring  Musculoskeletal: He exhibits no edema.  Lymphadenopathy:    He has no cervical adenopathy.  Neurological: He is alert and oriented to person, place, and time. No cranial nerve deficit.  No dysmetria to finger-nose-finger, 5 out of 5 strength in all 4 extremities  Skin: Skin is warm and dry.  Psychiatric: He has a normal mood and affect.    ED Course  Procedures (including critical care time) Labs Review Labs Reviewed  URINALYSIS, ROUTINE W REFLEX MICROSCOPIC - Abnormal; Notable for the following:    Glucose, UA 100 (*)    Hgb urine dipstick TRACE (*)    All other components within normal limits  HEPATIC FUNCTION PANEL - Abnormal; Notable for the following:    Albumin 3.0 (*)    All other components within normal limits  CBC  TROPONIN I  TROPONIN I  URINE MICROSCOPIC-ADD ON  I-STAT CG4 LACTIC ACID, ED    Imaging Review Dg Chest 2 View  01/20/2014   CLINICAL DATA:  Left  chest pain.  Shortness of breath.  Weakness.  EXAM: CHEST  2 VIEW  COMPARISON:  08/29/2013  FINDINGS: Low lung volumes again demonstrated. Diffuse pulmonary interstitial prominence appears stable. No evidence of pulmonary consolidation or pleural effusion. Heart size and mediastinal contours are also stable.  IMPRESSION: Stable low lung volumes and diffuse pulmonary interstitial prominence. No acute findings.   Electronically Signed   By: Earle Gell M.D.   On: 01/20/2014 17:33   Ct Head Wo Contrast  01/20/2014   CLINICAL DATA:  Headache for 1 week  EXAM: CT HEAD WITHOUT CONTRAST  TECHNIQUE: Contiguous axial images were obtained from the base of the skull through the vertex without intravenous contrast.  COMPARISON:  08/11/2013  FINDINGS: Diffuse age-related atrophy. No hemorrhage, infarct, or extra-axial fluid. No hydrocephalus or mass.  No skull fracture. No inflammatory changes in the visualized portions of the paranasal sinuses.  IMPRESSION: No acute findings   Electronically Signed   By: Skipper Cliche M.D.   On: 01/20/2014 18:19     EKG Interpretation   Date/Time:  Tuesday January 20 2014 16:34:16 EDT Ventricular Rate:  76 PR Interval:  268 QRS Duration: 93 QT Interval:  385 QTC Calculation: 433 R Axis:   54 Text Interpretation:  Sinus rhythm Prolonged PR interval Confirmed by  Dina Rich  MD, COURTNEY ([id number]) on 01/20/2014 6:27:28 PM      MDM   Final diagnoses:  Weight loss  Nausea  Nonintractable episodic headache, unspecified headache type  Chest pain, unspecified chest pain type    Patient presents with multiple complains including weight loss, anorexia chest pain and headache.  Nontoxic and vitals stable on exam.  Has seen his PCP.  Lab work is unremarkable including delta troponin.  EKG is reassuring.  CXR is stable and CT head is without evidence of mass or bleed.  Patient's symptoms have been indolent and ongoing.  He was given tylenol and zofran with some improvement and was able  to tolerate PO.  Patient encouraged to follow-up closely with both cardiology and PCP if symptoms continue.  After history, exam, and medical workup I feel the patient has been appropriately medically screened and is safe for discharge home. Pertinent diagnoses were discussed with the patient. Patient was given return precautions.    Merryl Hacker, MD 01/21/14 [phone]

## 2014-01-20 NOTE — ED Notes (Signed)
Pt placed into gown and on monitor upon arrival to room. Pt monitored by 5 lead, blood pressure, and pulse ox.  

## 2014-01-20 NOTE — Discharge Instructions (Signed)
Chest Pain (Nonspecific) °It is often hard to give a specific diagnosis for the cause of chest pain. There is always a chance that your pain could be related to something serious, such as a heart attack or a blood clot in the lungs. You need to follow up with your health care provider for further evaluation. °CAUSES  °· Heartburn. °· Pneumonia or bronchitis. °· Anxiety or stress. °· Inflammation around your heart (pericarditis) or lung (pleuritis or pleurisy). °· A blood clot in the lung. °· A collapsed lung (pneumothorax). It can develop suddenly on its own (spontaneous pneumothorax) or from trauma to the chest. °· Shingles infection (herpes zoster virus). °The chest wall is composed of bones, muscles, and cartilage. Any of these can be the source of the pain. °· The bones can be bruised by injury. °· The muscles or cartilage can be strained by coughing or overwork. °· The cartilage can be affected by inflammation and become sore (costochondritis). °DIAGNOSIS  °Lab tests or other studies may be needed to find the cause of your pain. Your health care provider may have you take a test called an ambulatory electrocardiogram (ECG). An ECG records your heartbeat patterns over a 24-hour period. You may also have other tests, such as: °· Transthoracic echocardiogram (TTE). During echocardiography, sound waves are used to evaluate how blood flows through your heart. °· Transesophageal echocardiogram (TEE). °· Cardiac monitoring. This allows your health care provider to monitor your heart rate and rhythm in real time. °· Holter monitor. This is a portable device that records your heartbeat and can help diagnose heart arrhythmias. It allows your health care provider to track your heart activity for several days, if needed. °· Stress tests by exercise or by giving medicine that makes the heart beat faster. °TREATMENT  °· Treatment depends on what may be causing your chest pain. Treatment may include: °¨ Acid blockers for  heartburn. °¨ Anti-inflammatory medicine. °¨ Pain medicine for inflammatory conditions. °¨ Antibiotics if an infection is present. °· You may be advised to change lifestyle habits. This includes stopping smoking and avoiding alcohol, caffeine, and chocolate. °· You may be advised to keep your head raised (elevated) when sleeping. This reduces the chance of acid going backward from your stomach into your esophagus. °Most of the time, nonspecific chest pain will improve within 2-3 days with rest and mild pain medicine.  °HOME CARE INSTRUCTIONS  °· If antibiotics were prescribed, take them as directed. Finish them even if you start to feel better. °· For the next few days, avoid physical activities that bring on chest pain. Continue physical activities as directed. °· Do not use any tobacco products, including cigarettes, chewing tobacco, or electronic cigarettes. °· Avoid drinking alcohol. °· Only take medicine as directed by your health care provider. °· Follow your health care provider's suggestions for further testing if your chest pain does not go away. °· Keep any follow-up appointments you made. If you do not go to an appointment, you could develop lasting (chronic) problems with pain. If there is any problem keeping an appointment, call to reschedule. °SEEK MEDICAL CARE IF:  °· Your chest pain does not go away, even after treatment. °· You have a rash with blisters on your chest. °· You have a fever. °SEEK IMMEDIATE MEDICAL CARE IF:  °· You have increased chest pain or pain that spreads to your arm, neck, jaw, back, or abdomen. °· You have shortness of breath. °· You have an increasing cough, or you cough   up blood.  You have severe back or abdominal pain.  You feel nauseous or vomit.  You have severe weakness.  You faint.  You have chills. This is an emergency. Do not wait to see if the pain will go away. Get medical help at once. Call your local emergency services (911 in [place] Do not drive  yourself to the hospital. MAKE SURE YOU:   Understand these instructions.  Will watch your condition.  Will get help right away if you are not doing well or get worse. Document Released: 03/29/2005 Document Revised: 06/24/2013 Document Reviewed: 01/23/2008 [facility] Patient Information 2015 [place], [place]. This information is not intended to replace advice given to you by your health care provider. Make sure you discuss any questions you have with your health care provider. General Headache Without Cause A headache is pain or discomfort felt around the head or neck area. The specific cause of a headache may not be found. There are many causes and types of headaches. A few common ones are:  Tension headaches.  Migraine headaches.  Cluster headaches.  Chronic daily headaches. HOME CARE INSTRUCTIONS   Keep all follow-up appointments with your caregiver or any specialist referral.  Only take over-the-counter or prescription medicines for pain or discomfort as directed by your caregiver.  Lie down in a dark, quiet room when you have a headache.  Keep a headache journal to find out what may trigger your migraine headaches. For example, write down:  What you eat and drink.  How much sleep you get.  Any change to your diet or medicines.  Try massage or other relaxation techniques.  Put ice packs or heat on the head and neck. Use these 3 to 4 times per day for 15 to 20 minutes each time, or as needed.  Limit stress.  Sit up straight, and do not tense your muscles.  Quit smoking if you smoke.  Limit alcohol use.  Decrease the amount of caffeine you drink, or stop drinking caffeine.  Eat and sleep on a regular schedule.  Get 7 to 9 hours of sleep, or as recommended by your caregiver.  Keep lights dim if bright lights bother you and make your headaches worse. SEEK MEDICAL CARE IF:   You have problems with the medicines you were prescribed.  Your medicines are not  working.  You have a change from the usual headache.  You have nausea or vomiting. SEEK IMMEDIATE MEDICAL CARE IF:   Your headache becomes severe.  You have a fever.  You have a stiff neck.  You have loss of vision.  You have muscular weakness or loss of muscle control.  You start losing your balance or have trouble walking.  You feel faint or pass out.  You have severe symptoms that are different from your first symptoms. MAKE SURE YOU:   Understand these instructions.  Will watch your condition.  Will get help right away if you are not doing well or get worse. Document Released: 06/19/2005 Document Revised: 09/11/2011 Document Reviewed: 07/05/2011 [facility] Patient Information 2015 [place], [place]. This information is not intended to replace advice given to you by your health care provider. Make sure you discuss any questions you have with your health care provider.

## 2014-01-20 NOTE — ED Notes (Signed)
Pt continues to be monitored by 5 lead, blood pressure, and pulse ox.  

## 2014-01-20 NOTE — ED Notes (Signed)
INFORMED DR. HORTON IV THERAPIST UNSUCESSFUL WITH IV START

## 2014-01-20 NOTE — ED Notes (Signed)
Pt getting dressed and ready for discharge waiting for paperwork at bedside.

## 2014-02-04 ENCOUNTER — Encounter (HOSPITAL_COMMUNITY): Payer: Self-pay | Admitting: Emergency Medicine

## 2014-02-04 ENCOUNTER — Emergency Department (HOSPITAL_COMMUNITY)
Admission: EM | Admit: 2014-02-04 | Discharge: 2014-02-05 | Disposition: A | Discharge status: Home / Self Care | Payer: Medicare HMO | Attending: Emergency Medicine | Admitting: Emergency Medicine

## 2014-02-04 ENCOUNTER — Emergency Department (HOSPITAL_COMMUNITY): Payer: Medicare HMO

## 2014-02-04 DIAGNOSIS — G589 Mononeuropathy, unspecified: Secondary | ICD-10-CM | POA: Insufficient documentation

## 2014-02-04 DIAGNOSIS — R0602 Shortness of breath: Secondary | ICD-10-CM | POA: Diagnosis not present

## 2014-02-04 DIAGNOSIS — R5383 Other fatigue: Secondary | ICD-10-CM | POA: Diagnosis present

## 2014-02-04 DIAGNOSIS — R630 Anorexia: Secondary | ICD-10-CM | POA: Insufficient documentation

## 2014-02-04 DIAGNOSIS — Z87891 Personal history of nicotine dependence: Secondary | ICD-10-CM | POA: Insufficient documentation

## 2014-02-04 DIAGNOSIS — I10 Essential (primary) hypertension: Secondary | ICD-10-CM | POA: Insufficient documentation

## 2014-02-04 DIAGNOSIS — R5381 Other malaise: Secondary | ICD-10-CM | POA: Diagnosis not present

## 2014-02-04 DIAGNOSIS — Z8719 Personal history of other diseases of the digestive system: Secondary | ICD-10-CM | POA: Insufficient documentation

## 2014-02-04 DIAGNOSIS — E119 Type 2 diabetes mellitus without complications: Secondary | ICD-10-CM | POA: Diagnosis not present

## 2014-02-04 DIAGNOSIS — E669 Obesity, unspecified: Secondary | ICD-10-CM | POA: Insufficient documentation

## 2014-02-04 DIAGNOSIS — Z8739 Personal history of other diseases of the musculoskeletal system and connective tissue: Secondary | ICD-10-CM | POA: Diagnosis not present

## 2014-02-04 LAB — BASIC METABOLIC PANEL
Anion gap: 12 (ref 5–15)
BUN: 10 mg/dL (ref 6–23)
CHLORIDE: 105 meq/L (ref 96–112)
CO2: 23 mEq/L (ref 19–32)
Calcium: 9.5 mg/dL (ref 8.4–10.5)
Creatinine, Ser: 0.98 mg/dL (ref 0.50–1.35)
GFR calc Af Amer: 87 mL/min — ABNORMAL LOW (ref >90)
GFR calc non Af Amer: 75 mL/min — ABNORMAL LOW (ref >90)
GLUCOSE: 136 mg/dL — AB (ref 70–99)
Potassium: 4.2 mEq/L (ref 3.7–5.3)
Sodium: 140 mEq/L (ref 137–147)

## 2014-02-04 LAB — I-STAT TROPONIN, ED: Troponin i, poc: 0 ng/mL (ref 0.00–0.08)

## 2014-02-04 LAB — CBC
HEMATOCRIT: 49.7 % (ref 39.0–52.0)
HEMOGLOBIN: 16.7 g/dL (ref 13.0–17.0)
MCH: 31.2 pg (ref 26.0–34.0)
MCHC: 33.6 g/dL (ref 30.0–36.0)
MCV: 92.7 fL (ref 78.0–100.0)
Platelets: 158 10*3/uL (ref 150–400)
RBC: 5.36 MIL/uL (ref 4.22–5.81)
RDW: 15.4 % (ref 11.5–15.5)
WBC: 6.5 10*3/uL (ref 4.0–10.5)

## 2014-02-04 LAB — CBG MONITORING, ED: Glucose-Capillary: 142 mg/dL — ABNORMAL HIGH (ref 70–99)

## 2014-02-04 NOTE — ED Notes (Signed)
Pt here for generalized weakness X 1 month. Pt states his chest feels fine at present, mild headache 4/10. Denies LOC or changes in vision.  Denies N/V and reports stools are normal.

## 2014-02-04 NOTE — ED Provider Notes (Signed)
CSN: [id number]     Arrival date & time 02/04/14  1549 History   First MD Initiated Contact with Patient 02/04/14 2041     Chief Complaint  Patient presents with  . Weakness     (Consider location/radiation/quality/duration/timing/severity/associated sxs/prior Treatment) Patient is a 80 y.o. male presenting with general illness.  Illness Location:  Generalized Quality:  Fatigue, Low apetite Severity:  Moderate Onset quality:  Gradual Duration:  3 months Timing:  Intermittent Progression:  Worsening Chronicity:  Chronic Context:  Dyspnea on exertion, "I feel tired all the time" Relieved by:  Nothing tried Worsened by:  Exertion Ineffective treatments:  Rest Associated symptoms: fatigue and shortness of breath   Associated symptoms: no abdominal pain, no chest pain, no congestion, no cough, no diarrhea, no ear pain, no fever, no headaches, no loss of consciousness, no myalgias, no nausea, no rash, no rhinorrhea, no sore throat, no vomiting and no wheezing   Fatigue:    Severity:  Severe   Duration:  1 month   Timing:  Constant   Progression:  Worsening Risk factors:  Elderly, smoking history, Obesity   BRANNAN CASSEDY is a 80 y.o. male presenting for multiple complaints.  Pt endorsing decreased appetite, and fatigue for 1 month or more.  Endorses worsening SOB on exertion for the past week.  Denies chest pain, PND, night sweats, F/C, N/V/D.       Past Medical History  Diagnosis Date  . Diabetes mellitus   . Hypertension   . Obesity   . H/O hiatal hernia   . Arthritis   . Hyperlipidemia   . Neuropathy    Past Surgical History  Procedure Laterality Date  . Abdominal surgery      pt has abdominal scarring and states he had sx but not sure what kind  . Back surgery    . Cholecystectomy     Family History  Problem Relation Age of Onset  . Cancer Mother    History  Substance Use Topics  . Smoking status: Former Smoker    Types: Cigarettes    Quit date:  12/30/1996  . Smokeless tobacco: Never Used  . Alcohol Use: No    Review of Systems  Constitutional: Positive for fatigue. Negative for fever.  HENT: Negative.  Negative for congestion, ear pain, rhinorrhea and sore throat.   Eyes: Negative.   Respiratory: Positive for shortness of breath. Negative for cough and wheezing.   Cardiovascular: Negative.  Negative for chest pain.  Gastrointestinal: Negative.  Negative for nausea, vomiting, abdominal pain and diarrhea.  Endocrine: Negative.   Genitourinary: Negative.   Musculoskeletal: Negative.  Negative for myalgias.  Skin: Negative.  Negative for rash.  Allergic/Immunologic: Negative.   Neurological: Negative.  Negative for loss of consciousness and headaches.  Hematological: Negative.   Psychiatric/Behavioral: Negative.       Allergies  Review of patient's allergies indicates no known allergies.  Home Medications   Prior to Admission medications   Medication Sig Start Date End Date Taking? Authorizing Provider  gabapentin (NEURONTIN) 400 MG capsule Take 1 capsule (400 mg total) by mouth 3 (three) times daily. 12/26/13  Yes Charlett Blake, MD  HYDROcodone-acetaminophen (NORCO) 10-325 MG per tablet Take 1 tablet by mouth 2 (two) times daily. For pain 08/31/13  Yes Birdie Riddle, MD  nitroGLYCERIN (NITROSTAT) 0.4 MG SL tablet Place 1 tablet (0.4 mg total) under the tongue every 5 (five) minutes x 3 doses as needed for chest pain. 08/31/13  Yes Ajay  Merrilee Jansky, MD  SURE COMFORT PEN NEEDLES 31G X 8 MM MISC 1 each by Other route daily. 01/07/14  Yes Historical Provider, MD  VICTOZA 18 MG/3ML SOPN Inject 0.6 mg into the skin daily. 01/05/14  Yes Historical Provider, MD   BP 155/65  Pulse 76  Temp(Src) 98.6 F (37 C) (Oral)  Resp 15  Ht 5\' 10"  (1.778 m)  Wt 260 lb (117.935 kg)  BMI 37.31 kg/m2  SpO2 98% Physical Exam  Nursing note and vitals reviewed. Constitutional: He is oriented to person, place, and time. He appears  well-developed and well-nourished. No distress.  HENT:  Head: Normocephalic and atraumatic.  Right Ear: External ear normal.  Left Ear: External ear normal.  Nose: Nose normal.  Mouth/Throat: Oropharynx is clear and moist. No oropharyngeal exudate.  Eyes: Conjunctivae and EOM are normal. Pupils are equal, round, and reactive to light. Right eye exhibits no discharge. Left eye exhibits no discharge. No scleral icterus.  Neck: Normal range of motion. Neck supple. No JVD present. No tracheal deviation present. No thyromegaly present.  Cardiovascular: Normal rate, regular rhythm, normal heart sounds and intact distal pulses.  Exam reveals no gallop and no friction rub.   No murmur heard. Pulmonary/Chest: Effort normal and breath sounds normal. No stridor. No respiratory distress. He has no wheezes. He has no rales. He exhibits no tenderness.  Abdominal: Soft. Bowel sounds are normal. He exhibits no distension. There is no tenderness. There is no rebound and no guarding.  Musculoskeletal: Normal range of motion. He exhibits no edema and no tenderness.  Lymphadenopathy:    He has no cervical adenopathy.  Neurological: He is alert and oriented to person, place, and time. He has normal strength. He is not disoriented. He displays no atrophy and no tremor. No cranial nerve deficit or sensory deficit. He exhibits normal muscle tone. He displays a negative Romberg sign. He displays no seizure activity. Coordination and gait normal. GCS eye subscore is 4. GCS verbal subscore is 5. GCS motor subscore is 6.  Skin: Skin is warm and dry. No rash noted. He is not diaphoretic. No erythema. No pallor.  Psychiatric: He has a normal mood and affect. His behavior is normal. Judgment and thought content normal.    ED Course  Procedures (including critical care time) Labs Review Labs Reviewed  BASIC METABOLIC PANEL - Abnormal; Notable for the following:    Glucose, Bld 136 (*)    GFR calc non Af Amer 75 (*)     GFR calc Af Amer 87 (*)    All other components within normal limits  URINALYSIS, ROUTINE W REFLEX MICROSCOPIC - Abnormal; Notable for the following:    Hgb urine dipstick SMALL (*)    Ketones, ur 15 (*)    All other components within normal limits  CBG MONITORING, ED - Abnormal; Notable for the following:    Glucose-Capillary 142 (*)    All other components within normal limits  CBC  URINE MICROSCOPIC-ADD ON  CBC WITH DIFFERENTIAL  CBC WITH DIFFERENTIAL  I-STAT TROPOININ, ED    Imaging Review Dg Chest 2 View  02/04/2014   CLINICAL DATA:  Weakness and dizziness  EXAM: CHEST  2 VIEW  COMPARISON:  01/20/2014  FINDINGS: Chronic lung disease with peripheral scarring as noted on prior studies. Negative for superimposed infiltrate or effusion. Cardiac enlargement without heart failure or edema.  IMPRESSION: COPD with pulmonary fibrosis.  No acute abnormality.   Electronically Signed   By: Franchot Gallo M.D.  On: 02/04/2014 23:19     EKG Interpretation   Date/Time:  Wednesday February 04 2014 19:40:59 EDT Ventricular Rate:  78 PR Interval:  263 QRS Duration: 92 QT Interval:  394 QTC Calculation: 449 R Axis:   68 Text Interpretation:  Sinus rhythm Prolonged PR interval No acute changes  Confirmed by Kathrynn Humble, MD, ANKIT [phone]) on 02/04/2014 10:50:41 PM      MDM   Final diagnoses:  Other fatigue   Will obtain screening workup including CXR, UA, and labs.  Blood was drawn in triage for BMP and CBC.  Attempted to obtain LFTs and TSH, as lab was unable to use the blood which was already drawn, but patient did not want any additional blood draw as he is a "hard stick."  No significant peripheral edema.  Low concern for CHF.  Pt has normal O2 saturations, and is not hypoxic, or hypotensive, low concern for PE.  No abdominal pain or tenderness on exam.  No chest pain, or pain radiating to back, low concern for aortic dissection.  Will screen for cardiac causes with a single troponin level,  as patient has had ongoing symptoms.    CXR significant for fibrotic changes.  Pt does not have any desaturation with ambulation, and is not short of breath or dyspneic while in the ED.  W/U largely unremarkable.  No signs of infection, or a metabolic process causing patient's symptoms.  Discussed w/u with patient, specifically his CXR findings as it may relate to his fatigue.  He will likely need further out patient studies for his fatigue and SOB.  No apparent acute process identified upon initial w/u, and pt stable for further w/u as an outpatient.  Patient  given return precautions for dyspnea and fatigue.  Advised to return for worsening symptoms including chest pain, shortness of breath, severe headache, intractable nausea or vomiting, fever, or chills, inability to take medications, or other acute concerns.  Advised to follow up with PCP in 2 days.  Patient in agreement with and expressed understanding of follow plan, plan of care, and return precautions.  All questions answered prior to discharge.  Patient was discharged in stable condition, ambulating without difficulty.   Patient care was discussed with my attending, Dr. Kathrynn Humble.    Hoyle Sauer, MD 02/05/14 2226

## 2014-02-04 NOTE — ED Notes (Signed)
Pt c/o generalized fatigue and decreased appetite x 1 week. Pt reports this isn't normal for him, sts he just feels weak all over and is having a harder time getting around the house. Nad, skin warm and dry, resp e/u.

## 2014-02-04 NOTE — ED Notes (Signed)
Pt unable to void 

## 2014-02-05 LAB — CBC WITH DIFFERENTIAL/PLATELET
BASOS ABS: 0 10*3/uL (ref 0.0–0.1)
Basophils Relative: 0 % (ref 0–1)
EOS ABS: 0.3 10*3/uL (ref 0.0–0.7)
Eosinophils Relative: 4 % (ref 0–5)
HCT: 45.7 % (ref 39.0–52.0)
Hemoglobin: 15.8 g/dL (ref 13.0–17.0)
LYMPHS PCT: 40 % (ref 12–46)
Lymphs Abs: 2.8 10*3/uL (ref 0.7–4.0)
MCH: 31.2 pg (ref 26.0–34.0)
MCHC: 34.6 g/dL (ref 30.0–36.0)
MCV: 90.1 fL (ref 78.0–100.0)
Monocytes Absolute: 0.5 10*3/uL (ref 0.1–1.0)
Monocytes Relative: 6 % (ref 3–12)
Neutro Abs: 3.5 10*3/uL (ref 1.7–7.7)
Neutrophils Relative %: 50 % (ref 43–77)
PLATELETS: 157 10*3/uL (ref 150–400)
RBC: 5.07 MIL/uL (ref 4.22–5.81)
RDW: 15.4 % (ref 11.5–15.5)
WBC: 7 10*3/uL (ref 4.0–10.5)

## 2014-02-05 LAB — URINALYSIS, ROUTINE W REFLEX MICROSCOPIC
BILIRUBIN URINE: NEGATIVE
Glucose, UA: NEGATIVE mg/dL
KETONES UR: 15 mg/dL — AB
Leukocytes, UA: NEGATIVE
Nitrite: NEGATIVE
Protein, ur: NEGATIVE mg/dL
Specific Gravity, Urine: 1.02 (ref 1.005–1.030)
Urobilinogen, UA: 1 mg/dL (ref 0.0–1.0)
pH: 5.5 (ref 5.0–8.0)

## 2014-02-05 LAB — URINE MICROSCOPIC-ADD ON

## 2014-02-05 NOTE — Discharge Instructions (Signed)

## 2014-02-05 NOTE — ED Notes (Signed)
Lab called stating patient CBC was hemolyzed. Phlebotomy aware and going to redraw.

## 2014-02-11 NOTE — ED Provider Notes (Signed)
I saw and evaluated the patient, reviewed the resident's note and I agree with the findings and plan.   EKG Interpretation   Date/Time:  Wednesday February 04 2014 19:40:59 EDT Ventricular Rate:  78 PR Interval:  263 QRS Duration: 92 QT Interval:  394 QTC Calculation: 449 R Axis:   68 Text Interpretation:  Sinus rhythm Prolonged PR interval No acute changes  Confirmed by Ahmed Inniss, MD, Quinton Voth ([id number]) on 02/04/2014 10:50:41 PM      Pt with cc of weakness. Filed Vitals:   02/05/14 0116  BP: 148/86  Pulse: 76  Temp:   Resp: 16    No fevers.  Pt has no focal complains and there are no focal findings on exam. Pt ambulated well, w/o hypoxia, or noticeable dyspnea. Fow now, we have agreed on conservative measures. Stable for d/c.  Varney Biles, MD 02/11/14 2255

## 2014-02-26 ENCOUNTER — Encounter: Payer: Self-pay | Admitting: Physical Medicine & Rehabilitation

## 2014-02-26 ENCOUNTER — Other Ambulatory Visit: Payer: Self-pay

## 2014-02-26 ENCOUNTER — Encounter: Payer: Medicare HMO | Attending: Physical Medicine & Rehabilitation

## 2014-02-26 ENCOUNTER — Ambulatory Visit (HOSPITAL_BASED_OUTPATIENT_CLINIC_OR_DEPARTMENT_OTHER): Payer: Commercial Managed Care - HMO | Admitting: Physical Medicine & Rehabilitation

## 2014-02-26 VITALS — BP 166/83 | HR 98 | Resp 14 | Ht 70.0 in | Wt 260.0 lb

## 2014-02-26 DIAGNOSIS — Z87891 Personal history of nicotine dependence: Secondary | ICD-10-CM | POA: Insufficient documentation

## 2014-02-26 DIAGNOSIS — G8929 Other chronic pain: Secondary | ICD-10-CM | POA: Diagnosis not present

## 2014-02-26 DIAGNOSIS — E785 Hyperlipidemia, unspecified: Secondary | ICD-10-CM | POA: Insufficient documentation

## 2014-02-26 DIAGNOSIS — M79609 Pain in unspecified limb: Secondary | ICD-10-CM

## 2014-02-26 DIAGNOSIS — E1149 Type 2 diabetes mellitus with other diabetic neurological complication: Secondary | ICD-10-CM | POA: Diagnosis not present

## 2014-02-26 DIAGNOSIS — E669 Obesity, unspecified: Secondary | ICD-10-CM | POA: Insufficient documentation

## 2014-02-26 DIAGNOSIS — Z79899 Other long term (current) drug therapy: Secondary | ICD-10-CM | POA: Diagnosis not present

## 2014-02-26 DIAGNOSIS — M545 Low back pain, unspecified: Secondary | ICD-10-CM | POA: Diagnosis not present

## 2014-02-26 DIAGNOSIS — I10 Essential (primary) hypertension: Secondary | ICD-10-CM | POA: Insufficient documentation

## 2014-02-26 DIAGNOSIS — E1142 Type 2 diabetes mellitus with diabetic polyneuropathy: Secondary | ICD-10-CM | POA: Diagnosis not present

## 2014-02-26 DIAGNOSIS — M961 Postlaminectomy syndrome, not elsewhere classified: Secondary | ICD-10-CM | POA: Diagnosis not present

## 2014-02-26 DIAGNOSIS — M79601 Pain in right arm: Secondary | ICD-10-CM

## 2014-02-26 NOTE — Patient Instructions (Signed)
As discussed he will followup with her primary care physician for your chronic pain medications.

## 2014-02-26 NOTE — Progress Notes (Signed)
EMG performed 02/26/2014.  See EMG report under media tab.  Summary  1. Abnormal study 2. 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 Given that he has numbness that spares the right fifth digit I do not think that this is symptomatic. 3. Right median neuropathy cannot localize to the wrist. I suspect that the diffuse nature of his upper extremity findings is most likely related to progression of his diabetic neuropathy. A median nerve ultrasound may help. Please refer if this is desired. This would look for enlargement of the median nerve proximal to the transverse carpal ligament.

## 2014-03-04 ENCOUNTER — Encounter: Payer: Self-pay | Admitting: Physical Medicine & Rehabilitation

## 2014-03-23 ENCOUNTER — Emergency Department (HOSPITAL_COMMUNITY): Payer: Medicare HMO

## 2014-03-23 ENCOUNTER — Encounter (HOSPITAL_COMMUNITY): Payer: Self-pay | Admitting: Emergency Medicine

## 2014-03-23 ENCOUNTER — Emergency Department (HOSPITAL_COMMUNITY)
Admission: EM | Admit: 2014-03-23 | Discharge: 2014-03-23 | Disposition: A | Discharge status: Home / Self Care | Payer: Medicare HMO | Attending: Emergency Medicine | Admitting: Emergency Medicine

## 2014-03-23 DIAGNOSIS — E119 Type 2 diabetes mellitus without complications: Secondary | ICD-10-CM | POA: Diagnosis not present

## 2014-03-23 DIAGNOSIS — R5383 Other fatigue: Secondary | ICD-10-CM | POA: Diagnosis present

## 2014-03-23 DIAGNOSIS — R42 Dizziness and giddiness: Secondary | ICD-10-CM | POA: Insufficient documentation

## 2014-03-23 DIAGNOSIS — D869 Sarcoidosis, unspecified: Secondary | ICD-10-CM | POA: Insufficient documentation

## 2014-03-23 DIAGNOSIS — Z79899 Other long term (current) drug therapy: Secondary | ICD-10-CM | POA: Insufficient documentation

## 2014-03-23 DIAGNOSIS — Z8669 Personal history of other diseases of the nervous system and sense organs: Secondary | ICD-10-CM | POA: Diagnosis not present

## 2014-03-23 DIAGNOSIS — Z87891 Personal history of nicotine dependence: Secondary | ICD-10-CM | POA: Diagnosis not present

## 2014-03-23 DIAGNOSIS — E669 Obesity, unspecified: Secondary | ICD-10-CM | POA: Diagnosis not present

## 2014-03-23 DIAGNOSIS — M129 Arthropathy, unspecified: Secondary | ICD-10-CM | POA: Insufficient documentation

## 2014-03-23 DIAGNOSIS — R5381 Other malaise: Secondary | ICD-10-CM | POA: Insufficient documentation

## 2014-03-23 DIAGNOSIS — I10 Essential (primary) hypertension: Secondary | ICD-10-CM | POA: Insufficient documentation

## 2014-03-23 DIAGNOSIS — R531 Weakness: Secondary | ICD-10-CM

## 2014-03-23 LAB — URINALYSIS, ROUTINE W REFLEX MICROSCOPIC
Bilirubin Urine: NEGATIVE
Glucose, UA: 250 mg/dL — AB
Ketones, ur: NEGATIVE mg/dL
LEUKOCYTES UA: NEGATIVE
Nitrite: NEGATIVE
PH: 5 (ref 5.0–8.0)
Protein, ur: 100 mg/dL — AB
Specific Gravity, Urine: 1.024 (ref 1.005–1.030)
Urobilinogen, UA: 0.2 mg/dL (ref 0.0–1.0)

## 2014-03-23 LAB — BASIC METABOLIC PANEL
ANION GAP: 15 (ref 5–15)
BUN: 10 mg/dL (ref 6–23)
CHLORIDE: 101 meq/L (ref 96–112)
CO2: 20 meq/L (ref 19–32)
CREATININE: 1.02 mg/dL (ref 0.50–1.35)
Calcium: 9.2 mg/dL (ref 8.4–10.5)
GFR calc non Af Amer: 66 mL/min — ABNORMAL LOW (ref >90)
GFR, EST AFRICAN AMERICAN: 77 mL/min — AB (ref >90)
Glucose, Bld: 154 mg/dL — ABNORMAL HIGH (ref 70–99)
POTASSIUM: 4.3 meq/L (ref 3.7–5.3)
Sodium: 136 mEq/L — ABNORMAL LOW (ref 137–147)

## 2014-03-23 LAB — TROPONIN I: Troponin I: <0.3 ng/mL (ref <0.30)

## 2014-03-23 LAB — D-DIMER, QUANTITATIVE: D-Dimer, Quant: 0.88 ug/mL-FEU — ABNORMAL HIGH (ref 0.00–0.48)

## 2014-03-23 LAB — CBC
HCT: 48.6 % (ref 39.0–52.0)
Hemoglobin: 17.2 g/dL — ABNORMAL HIGH (ref 13.0–17.0)
MCH: 31.2 pg (ref 26.0–34.0)
MCHC: 35.4 g/dL (ref 30.0–36.0)
MCV: 88.2 fL (ref 78.0–100.0)
PLATELETS: 157 10*3/uL (ref 150–400)
RBC: 5.51 MIL/uL (ref 4.22–5.81)
RDW: 14.6 % (ref 11.5–15.5)
WBC: 7 10*3/uL (ref 4.0–10.5)

## 2014-03-23 LAB — URINE MICROSCOPIC-ADD ON

## 2014-03-23 MED ORDER — IOHEXOL 350 MG/ML SOLN
100.0000 mL | Freq: Once | INTRAVENOUS | Status: AC | PRN
  Administered 2014-03-23: 100 mL via INTRAVENOUS

## 2014-03-23 MED ORDER — SODIUM CHLORIDE 0.9 % IV BOLUS (SEPSIS)
250.0000 mL | Freq: Once | INTRAVENOUS | Status: AC
  Administered 2014-03-23: 250 mL via INTRAVENOUS

## 2014-03-23 NOTE — ED Notes (Signed)
Pt reports dizziness x 4 days that is progressively worsening. States going from sitting to standing makes it worse. Describes as "room spinning." Pt also reports generalized fatigue. Reports 5/10 HA. Denies vision changes. PT denies any chest pain or SOB. Pt is AO x4. Neuro intact, grips equal.

## 2014-03-23 NOTE — ED Notes (Signed)
Jody, RN to attempt ultrasound IV placement.

## 2014-03-23 NOTE — ED Provider Notes (Signed)
Signed out to me by Doctor Eulis Foster to followup on labs and imaging. Patient had been seen for generalized weakness and unexplained weight loss. He complained of a mild headache, but that resolved before I evaluated him. He also reports dizziness, which he has been seen before in the past. He did have a CT scan previously that did not show any acute abnormality. Patient does not have any focal neurologic findings on examination.  Focal Neuro Exam:  Extraocular muscle movement: normal No visual field cut Pupils: equal and reactive both direct and consensual response is normal No nystagmus present    Sensory function is intact to light touch, pinprick Proprioception intact  Grip strength 5/5 symmetric in upper extremities Lower extremity strength 5/5 against gravity No pronator drift Normal finger to nose bilaterally Normal heel to shin bilaterally  Gait: normal  Patient does not require repeat CT at this time. Patient does endorse mild shortness of breath. X-ray did show findings consistent with possible sarcoid which has been seen in the past. I discussed this with the patient, he is unfamiliar with this term, has not had any treatment for it in the past. As the d-dimer that Doctor Eulis Foster had ordered was elevated, CT angio chest was performed. I included abdomen and pelvis as well because the patient does endorse recurrent abdominal pain and a 50 pound weight loss. No acute findings were seen, other than lung findings consistent with sarcoidosis or possible pulmonary fibrosis. Patient feeling much better after IV fluids. He reports that previously when he had similar symptoms he felt better after fluids as well. Perhaps he was slightly dehydrated. Will be discharged, follow up with primary doctor and Pulmonology.   Orpah Greek, MD 03/23/14 2228

## 2014-03-23 NOTE — ED Notes (Signed)
Pt back from CT

## 2014-03-23 NOTE — ED Provider Notes (Signed)
CSN: [id number]     Arrival date & time 03/23/14  1216 History   First MD Initiated Contact with Patient 03/23/14 1540     Chief Complaint  Patient presents with  . Dizziness  . Fatigue     (Consider location/radiation/quality/duration/timing/severity/associated sxs/prior Treatment) The history is provided by the patient.    He complains of weakness and shortness of breath, with exertion for 2 weeks. He also has had a 50 pound weight loss over 3 months and decreased appetite, during this time. He is taking his usual medications, without relief. He denies chest pain, fever, chills, cough, nausea, vomiting, diarrhea, or constipation. He has not seen his doctor recently. There are no other known modifying factors.  Past Medical History  Diagnosis Date  . Diabetes mellitus   . Hypertension   . Obesity   . H/O hiatal hernia   . Arthritis   . Hyperlipidemia   . Neuropathy    Past Surgical History  Procedure Laterality Date  . Abdominal surgery      pt has abdominal scarring and states he had sx but not sure what kind  . Back surgery    . Cholecystectomy     Family History  Problem Relation Age of Onset  . Cancer Mother    History  Substance Use Topics  . Smoking status: Former Smoker    Types: Cigarettes    Quit date: 12/30/1996  . Smokeless tobacco: Never Used  . Alcohol Use: No    Review of Systems  All other systems reviewed and are negative.     Allergies  Review of patient's allergies indicates no known allergies.  Home Medications   Prior to Admission medications   Medication Sig Start Date End Date Taking? Authorizing Provider  atorvastatin (LIPITOR) 20 MG tablet  01/22/14   Historical Provider, MD  gabapentin (NEURONTIN) 400 MG capsule Take 1 capsule (400 mg total) by mouth 3 (three) times daily. 12/26/13   Charlett Blake, MD  HYDROcodone-acetaminophen (NORCO) 10-325 MG per tablet Take 1 tablet by mouth 2 (two) times daily. For pain 08/31/13   Birdie Riddle, MD  nitroGLYCERIN (NITROSTAT) 0.4 MG SL tablet Place 1 tablet (0.4 mg total) under the tongue every 5 (five) minutes x 3 doses as needed for chest pain. 08/31/13   Birdie Riddle, MD  PROAIR HFA 108 (90 BASE) MCG/ACT inhaler  02/11/14   Historical Provider, MD  SURE COMFORT PEN NEEDLES 31G X 8 MM MISC 1 each by Other route daily. 01/07/14   Historical Provider, MD  VICTOZA 18 MG/3ML SOPN Inject 0.6 mg into the skin daily. 01/05/14   Historical Provider, MD   BP 114/81  Pulse 79  Temp(Src) 97.3 F (36.3 C) (Oral)  Resp 21  SpO2 97% Physical Exam  Nursing note and vitals reviewed. Constitutional: He is oriented to person, place, and time. He appears well-developed and well-nourished. No distress.  HENT:  Head: Normocephalic and atraumatic.  Right Ear: External ear normal.  Left Ear: External ear normal.  Eyes: Conjunctivae and EOM are normal. Pupils are equal, round, and reactive to light.  Neck: Normal range of motion and phonation normal. Neck supple.  Cardiovascular: Normal rate, regular rhythm and normal heart sounds.   No JVD  Pulmonary/Chest: Effort normal and breath sounds normal. He exhibits no bony tenderness.  Good airflow bilaterally without wheezes, rales, or rhonchi  Abdominal: Soft. There is no tenderness.  Musculoskeletal: Normal range of motion. He exhibits no edema and no  tenderness.  Neurological: He is alert and oriented to person, place, and time. No cranial nerve deficit or sensory deficit. He exhibits normal muscle tone. Coordination normal.  Skin: Skin is warm, dry and intact.  Psychiatric: He has a normal mood and affect. His behavior is normal. Judgment and thought content normal.    ED Course  Procedures (including critical care time) Medications  sodium chloride 0.9 % bolus 250 mL (0 mLs Intravenous Stopped 03/23/14 2239)  iohexol (OMNIPAQUE) 350 MG/ML injection 100 mL (100 mLs Intravenous Contrast Given 03/23/14 2109)    No data found.   The case  was discussed with Dr. Betsey Holiday will followup with the patient, following return of the laboratory test   Labs Review Labs Reviewed  CBC - Abnormal; Notable for the following:    Hemoglobin 17.2 (*)    All other components within normal limits  BASIC METABOLIC PANEL - Abnormal; Notable for the following:    Sodium 136 (*)    Glucose, Bld 154 (*)    GFR calc non Af Amer 66 (*)    GFR calc Af Amer 77 (*)    All other components within normal limits  URINALYSIS, ROUTINE W REFLEX MICROSCOPIC - Abnormal; Notable for the following:    Glucose, UA 250 (*)    Hgb urine dipstick SMALL (*)    Protein, ur 100 (*)    All other components within normal limits  D-DIMER, QUANTITATIVE - Abnormal; Notable for the following:    D-Dimer, Quant 0.88 (*)    All other components within normal limits  URINE MICROSCOPIC-ADD ON - Abnormal; Notable for the following:    Casts HYALINE CASTS (*)    All other components within normal limits  URINE CULTURE  TROPONIN I    Imaging Review No results found.   EKG Interpretation   Date/Time:  Monday March 23 2014 12:23:53 EDT Ventricular Rate:  85 PR Interval:  248 QRS Duration: 92 QT Interval:  360 QTC Calculation: 428 R Axis:   70 Text Interpretation:  Sinus rhythm with 1st degree A-V block Nonspecific  ST abnormality Abnormal ECG since last tracing no significant change  Confirmed by Sylvester Minton  MD, Ly Wass ([id number]) on 03/23/2014 3:59:31 PM      MDM   Final diagnoses:  Weakness  Sarcoidosis  Dizziness   Nonspecific complaints, with weight loss. Screening evaluation done, for medical evaluation.  Nursing Notes Reviewed/ Care Coordinated, and agree without changes. Applicable Imaging Reviewed.  Interpretation of Laboratory Data incorporated into ED treatment  Plan: As per oncoming provider team   Richarda Blade, MD 03/26/14 1739

## 2014-03-23 NOTE — Discharge Instructions (Signed)
Sarcoidosis, Schaumann's Disease, Sarcoid of [place] Sarcoidosis appears briefly and heals naturally in 60 to 70 percent of cases, often without the patient knowing or doing anything about it. 20 to 30 percent of patients with sarcoidosis are left with some permanent lung damage. In 10 to 15 percent of the patients, sarcoidosis can become chronic (long lasting). When either the granulomas or fibrosis seriously affect the function of a vital organ (lungs, heart, nervous system, liver, or kidneys), sarcoidosis can be fatal. This occurs 5 to 10 percent of the time. No one can predict how sarcoidosis will progress in an individual patient. The symptoms the patient experiences, the caregiver's findings, and the patient's race can give some clues. Sarcoidosis was once considered a rare disease. We now know that it is a common chronic illness that appears all over the world. It is the most common of the fibrotic (scarring) lung disorders. Anyone can get sarcoidosis. It occurs in all races and in both sexes. The risk is greater if you are a young black adult, especially a black woman, or are of Scandinavian, German, Irish, or Puerto Rican origin. In sarcoidosis, small lumps (also called nodules or granulomas) develop in multiple organs of the body. These granulomas are small collections of inflamed cells. They commonly appear in the lungs. This is the most common organ affected. They also occur in the lymph nodes (your glands), skin, liver, and eyes. The granulomas vary in the amount of disease they produce from very little with no problems (symptoms) to causing severe illness. The cause of sarcoidosis is not known. It may be due to an abnormal immune reaction in the body. Most people will recover. A few people will develop long lasting conditions that may get worse. Women are affected more often than men. The majority of those affected are under forty years of age. Because we do not know the cause, we do not have ways to  prevent it. SYMPTOMS   Fever.  Loss of appetite.  Night sweats.  Joint pain.  Aching muscles Symptoms vary because the disease affects different parts of the body in different people. Most people who see their caregiver with sarcoidosis have lung problems. The first signs are usually a dry cough and shortness of breath. There may also be wheezing, chest pain, or a cough that brings up bloody mucus. In severe cases, lung function may become so poor that the person cannot perform even the simple routine tasks of daily life. Other symptoms of sarcoidosis are less common than lung symptoms. They can include:  Skin symptoms. Sarcoidosis can appear as a collection of tender, red bumps called erythema nodosum. These bumps usually occur on the face, shins, and arms. They can also occur as a scaly, purplish discoloration on the nose, cheeks, and ears. This is called lupus pernio. Less often, sarcoidosis causes cysts, pimples, or disfiguring over growths of skin. In many cases, the disfiguring over growths develop in areas of scars or tattoos.  Eye symptoms. These include redness, eye pain, and sensitivity to light.  Heart symptoms. These include irregular heartbeat and heart failure.  Other symptoms. A person may have paralyzed facial muscles, seizures, psychiatric symptoms, swollen salivary glands, or bone pain. DIAGNOSIS  Even when there are no symptoms, your caregiver can sometimes pick up signs of sarcoidosis during a routine examination, usually through a chest x-ray or when checking other complaints. The patient's age and race or ethnic group can raise an additional red flag that a sign or symptom could   be related to sarcoidosis.   Enlargement of the salivary or tear glands and cysts in bone tissue may also be caused by sarcoidosis.  You may have had a biopsy done that shows signs of sarcoidosis. A biopsy is a small tissue sample that is removed for laboratory testing. This tissue sample can  be taken from your lung, skin, lip, or another inflamed or abnormal area of the body.  You may have had an abnormal chest X-ray. Although you appear healthy, a chest X-ray ordered for other reasons may turn up abnormalities that suggest sarcoidosis.  Other tests may be needed. These tests may be done to rule out other illnesses or to determine the amount of organ damage caused by sarcoidosis. Some of the most common tests are:  Blood levels of calcium or angiotensin-converting enzyme may be high in people with sarcoidosis.  Blood tests to evaluate how well your liver is functioning.  Lung function tests to measure how well you are breathing.  A complete eye examination. TREATMENT  If sarcoidosis does not cause any problems, treatment may not be necessary. Your caregiver may decide to simply monitor your condition. As part of this monitoring process, you may have frequent office visits, follow-up chest X-rays, and tests of your lung function.If you have signs of moderate or severe lung disease, your doctor may recommend:  A corticosteroid drug, such as prednisone (sold under several brand names).  Corticosteroids also are used to treat sarcoidosis of the eyes, joints, skin, nerves, or heart.  Corticosteroid eye drops may be used for the eyes.  Over-the-counter medications like nonsteroidal anti-inflammatory drugs (NSAID) often are used to treat joint pain first before corticosteroids, which tend to have more side effects.  If corticosteroids are not effective or cause serious side effects, other drugs that alter or suppress the immune system may be used.  In rare cases, when sarcoidosis causes life-threatening lung disease, a lung transplant may be necessary. However, there is some risk that the new lungs also will be attacked by sarcoidosis. SEEK IMMEDIATE MEDICAL CARE IF:   You suffer from shortness of breath or a lingering cough.  You develop new problems that may be related to the  disease. Remember this disease can affect almost all organs of the body and cause many different problems. Document Released: 04/19/2004 Document Revised: 09/11/2011 Document Reviewed: 10/15/2013 ExitCare Patient Information 2015 [employer]. This information is not intended to replace advice given to you by your health care provider. Make sure you discuss any questions you have with your health care provider.  

## 2014-03-23 NOTE — ED Notes (Addendum)
RN and phlebotomist attempted to obtain labs. Will continue to try. MD aware

## 2014-03-23 NOTE — ED Notes (Signed)
Pt left AMA °

## 2014-03-23 NOTE — ED Notes (Signed)
Pt to CT at this time.

## 2014-03-25 LAB — URINE CULTURE
CULTURE: NO GROWTH
Colony Count: NO GROWTH

## 2014-04-02 ENCOUNTER — Encounter: Payer: Self-pay | Admitting: Neurology

## 2014-04-02 ENCOUNTER — Encounter (INDEPENDENT_AMBULATORY_CARE_PROVIDER_SITE_OTHER): Payer: Self-pay

## 2014-04-02 ENCOUNTER — Ambulatory Visit (INDEPENDENT_AMBULATORY_CARE_PROVIDER_SITE_OTHER): Payer: Medicare HMO | Admitting: Neurology

## 2014-04-02 VITALS — BP 112/75 | HR 75 | Temp 97.9°F | Ht 69.0 in | Wt 249.0 lb

## 2014-04-02 DIAGNOSIS — G478 Other sleep disorders: Secondary | ICD-10-CM

## 2014-04-02 DIAGNOSIS — R51 Headache: Secondary | ICD-10-CM

## 2014-04-02 DIAGNOSIS — G4719 Other hypersomnia: Secondary | ICD-10-CM

## 2014-04-02 DIAGNOSIS — R519 Headache, unspecified: Secondary | ICD-10-CM

## 2014-04-02 NOTE — Progress Notes (Signed)
Subjective:    Patient ID: [name] is a 80 y.o. male.  HPI    Star Age, MD, PhD [facility] [address], [address]O. [place], [place] [address]  Dear Ulice Dash,   I saw your patient, [name], upon your kind request in my neurologic clinic today for initial consultation of his sleep disturbance, in particular, concern for underlying obstructive sleep apnea. The patient is accompanied by his wife (ex-wife) today. As you know, [name] is a 80 year old right-handed gentleman with an underlying medical history of type 2 diabetes, peripheral neuropathy (sees Dr. Letta Pate), arthritis, possible sarcoidosis, and chest pain, who reports non-restorative sleep, recurrent headaches, and daytime tiredness. He has no energy. He has to take a nap every day. He has some nocturia. He has no BT or WT routine or schedule. He wakes up poorly rested. His feet hurt at night. He has disturbed sleep. He does not report any significant snoring or waking up with a sense of gasping for air. He wakes up not well rested. He takes at least one nap daily. His ESS is 10/24 today.     His Past Medical History Is Significant For: Past Medical History  Diagnosis Date  . Diabetes mellitus   . Hypertension   . Obesity   . H/O hiatal hernia   . Arthritis   . Hyperlipidemia   . Neuropathy     His Past Surgical History Is Significant For: Past Surgical History  Procedure Laterality Date  . Abdominal surgery      pt has abdominal scarring and states he had sx but not sure what kind  . Back surgery    . Cholecystectomy      His Family History Is Significant For: Family History  Problem Relation Age of Onset  . Cancer Mother     His Social History Is Significant For: History   Social History  . Marital Status: Widowed    Spouse Name: N/A    Number of Children: 10  . Years of Education: 10   Occupational History  .      preacher   Social History Main  Topics  . Smoking status: Former Smoker    Types: Cigarettes    Quit date: 12/30/1996  . Smokeless tobacco: Never Used  . Alcohol Use: No  . Drug Use: No  . Sexual Activity: None   Other Topics Concern  . None   Social History Narrative   Patient is right handed and resides with daughter, consumes occas. Sodas.    Her Allergies Are:  No Known Allergies:   His Current Medications Are:  Outpatient Encounter Prescriptions as of 04/02/2014  Medication Sig  . atorvastatin (LIPITOR) 20 MG tablet   . gabapentin (NEURONTIN) 400 MG capsule Take 1 capsule (400 mg total) by mouth 3 (three) times daily.  Marland Kitchen HYDROcodone-acetaminophen (NORCO) 10-325 MG per tablet Take 1 tablet by mouth 2 (two) times daily. For pain  . nitroGLYCERIN (NITROSTAT) 0.4 MG SL tablet Place 1 tablet (0.4 mg total) under the tongue every 5 (five) minutes x 3 doses as needed for chest pain.  Marland Kitchen PROAIR HFA 108 (90 BASE) MCG/ACT inhaler   . SURE COMFORT PEN NEEDLES 31G X 8 MM MISC 1 each by Other route daily.  Marland Kitchen VICTOZA 18 MG/3ML SOPN Inject 0.6 mg into the skin daily.  :   Review of Systems:  Out of a complete 14 point review of systems, all are reviewed and negative with  the exception of these symptoms as listed below:   Review of Systems  Constitutional: Positive for chills and fatigue.  HENT:       Trouble swallowing  Eyes: Positive for itching.  Gastrointestinal: Positive for diarrhea and constipation.  Psychiatric/Behavioral:       Depression    Objective:  Neurologic Exam  Physical Exam  Physical Examination:   Filed Vitals:   04/02/14 1040  BP: 112/75  Pulse: 75  Temp: 97.9 F (36.6 C)    General Examination: The patient is a very pleasant 80 y.o. male in no acute distress. He appears well-developed and well-nourished and adequately groomed.   HEENT: Normocephalic, atraumatic, pupils are equal, round and reactive to light and accommodation. Mild b/l cataracts are noted. Funduscopic exam is  normal with sharp disc margins noted. Extraocular tracking is good without limitation to gaze excursion or nystagmus noted. Normal smooth pursuit is noted. Hearing is grossly intact. Tympanic membranes are clear on the R and obscured with cerumen on the L. Face is symmetric with normal facial animation and normal facial sensation. Speech is clear with no dysarthria noted. There is no hypophonia. There is no lip, neck/head, jaw or voice tremor. Neck is supple with full range of passive and active motion. There are no carotid bruits on auscultation. Oropharynx exam reveals: moderate mouth dryness, several missing teeth and moderate airway crowding, due to floppy soft palate and larger tongue. Mallampati is class [name]. Tongue protrudes centrally and palate elevates symmetrically. Tonsils are small or absent. Neck size is 16.5 inches.  Chest: Clear to auscultation without wheezing, rhonchi or crackles noted.  Heart: S1+S2+0, regular and normal without murmurs, rubs or gallops noted.   Abdomen: Soft, non-tender and non-distended with normal bowel sounds appreciated on auscultation.  Extremities: There is no pitting edema in the distal lower extremities bilaterally. Pedal pulses are intact.  Skin: Warm and dry without trophic changes noted. There are no varicose veins. Chronic changes noted in the distal legs.  Musculoskeletal: exam reveals no obvious joint deformities, tenderness or joint swelling or erythema.   Neurologically:  Mental status: The patient is awake, alert and oriented in all 4 spheres. His immediate and remote memory, attention, language skills and fund of knowledge are appropriate. There is no evidence of aphasia, agnosia, apraxia or anomia. Speech is clear with normal prosody and enunciation. Thought process is linear. Mood is normal and affect is normal.  Cranial nerves II - XII are as described above under HEENT exam. In addition: shoulder shrug is normal with equal shoulder height  noted. Motor exam: Normal bulk, strength and tone is noted. There is no drift, tremor or rebound. Romberg is negative. Reflexes are 2+ throughout. Fine motor skills and coordination and mildly : intact with normal finger taps, normal hand movements, normal rapid alternating patting, normal foot taps and normal foot agility.  Cerebellar testing: No dysmetria or intention tremor on finger to nose testing. There is no truncal or gait ataxia.  Sensory exam: intact to light touch, pinprick, vibration, temperature sense in the upper extremities, but decreased to all modalities in the LEs up to below the knees. .  Gait, station and balance: He stands with difficulty. No veering to one side is noted. No leaning to one side is noted. Posture is age-appropriate and stance is narrow based. Gait shows normal stride length and normal pace. No problems turning are noted. He turns in 3 steps. Tandem walk is not possible for him.   Assessment and Plan:  In summary, [name] is a very pleasant 80 y.o.-year old male with an underlying medical history of type 2 diabetes, peripheral neuropathy, arthritis, possible sarcoidosis, and chest pain, who reports nonrestorative sleep, recurrent headaches, lack of energy and daytime tiredness, the need to take a nap every day and poor sleep consolidation. His history and physical exam including obesity and a tighter looking airway are somewhat concerning for obstructive sleep apnea (OSA).  I had a long chat with the patient about my findings and the diagnosis of OSA, its prognosis and treatment options. We talked about medical treatments, surgical interventions and non-pharmacological approaches. I explained in particular the risks and ramifications of untreated moderate to severe OSA, especially with respect to developing cardiovascular disease down the Road, including congestive heart failure, difficult to treat hypertension, cardiac arrhythmias, or stroke. Even type 2  diabetes has, in part, been linked to untreated OSA. Symptoms of untreated OSA include daytime sleepiness, memory problems, mood irritability and mood disorder such as depression and anxiety, lack of energy, as well as recurrent headaches, especially morning headaches. We talked about trying to maintain a healthy lifestyle in general, as well as the importance of weight control. I encouraged the patient to eat healthy, exercise daily and keep well hydrated, to keep a scheduled bedtime and wake time routine, to not skip any meals and eat healthy snacks in between meals. I advised the patient not to drive when feeling sleepy. I recommended the following at this time: sleep study with potential positive airway pressure titration. (We will score hypopneas at 4% and split the sleep study into diagnostic and treatment portion, if the estimated. 2 hour AHI is >15/h).   I explained the sleep test procedure to the patient and also outlined possible surgical and non-surgical treatment options of OSA, including the use of a custom-made dental device (which would require a referral to a specialist dentist or oral surgeon), upper airway surgical options, such as pillar implants, radiofrequency surgery, tongue base surgery, and UPPP (which would involve a referral to an ENT surgeon). Rarely, jaw surgery such as mandibular advancement may be considered.  I also explained the CPAP treatment option to the patient, who indicated that he would be willing to try CPAP if the need arises. I explained the importance of being compliant with PAP treatment, not only for insurance purposes but primarily to improve His symptoms, and for the patient's long term health benefit, including to reduce His cardiovascular risks. I answered all their questions today and the patient and his friend (ex-wife) were in agreement. I would like to see him back after the sleep study is completed and encouraged him to call with any interim questions,  concerns, problems or updates.   Thank you very much for allowing me to participate in the care of this nice patient. If I can be of any further assistance to you please do not hesitate to call me at [phone].  Sincerely,   Star Age, MD, PhD

## 2014-04-02 NOTE — Patient Instructions (Signed)

## 2014-05-11 ENCOUNTER — Ambulatory Visit (INDEPENDENT_AMBULATORY_CARE_PROVIDER_SITE_OTHER): Payer: Medicare HMO | Admitting: Neurology

## 2014-05-11 DIAGNOSIS — G4719 Other hypersomnia: Secondary | ICD-10-CM

## 2014-05-11 DIAGNOSIS — G4761 Periodic limb movement disorder: Secondary | ICD-10-CM

## 2014-05-11 DIAGNOSIS — G4733 Obstructive sleep apnea (adult) (pediatric): Secondary | ICD-10-CM

## 2014-05-11 DIAGNOSIS — G479 Sleep disorder, unspecified: Secondary | ICD-10-CM

## 2014-05-11 DIAGNOSIS — R9431 Abnormal electrocardiogram [ECG] [EKG]: Secondary | ICD-10-CM

## 2014-05-11 DIAGNOSIS — G4720 Circadian rhythm sleep disorder, unspecified type: Secondary | ICD-10-CM

## 2014-05-11 NOTE — Sleep Study (Signed)
Please see the scanned sleep study interpretation located in the Procedure tab within the Chart Review section. 

## 2014-05-20 ENCOUNTER — Telehealth: Payer: Self-pay | Admitting: Neurology

## 2014-05-20 DIAGNOSIS — G4733 Obstructive sleep apnea (adult) (pediatric): Secondary | ICD-10-CM

## 2014-05-20 NOTE — Telephone Encounter (Signed)
Please call and notify the patient that the recent sleep study did confirm the diagnosis of obstructive sleep apnea and that I recommend treatment for this in the form of CPAP. This will require a repeat sleep study for proper titration and mask fitting. Please explain to patient and arrange for a CPAP titration study. I have placed an order in the chart. Thanks, Rocio Roam, MD, PhD [facility] ([facility]  

## 2014-05-22 NOTE — Telephone Encounter (Signed)
Patient was contacted and provided the results of his overnight sleep study which revealed the presence of sleep apnea.  Patient was advised that a CPAP titration study had been ordered and he scheduled his Titration study for Tues. Jan. 4th at 08:00 pm.  Patient was mailed a copy of the results and a copy was faxed to Dr. Kela Millin.

## 2014-07-23 NOTE — Telephone Encounter (Signed)
ERROR

## 2014-08-10 ENCOUNTER — Encounter (HOSPITAL_COMMUNITY): Payer: Self-pay | Admitting: Emergency Medicine

## 2014-08-10 ENCOUNTER — Emergency Department (HOSPITAL_COMMUNITY)
Admission: EM | Admit: 2014-08-10 | Discharge: 2014-08-10 | Discharge status: Against Medical Advice | Payer: Medicare HMO | Attending: Emergency Medicine | Admitting: Emergency Medicine

## 2014-08-10 DIAGNOSIS — I10 Essential (primary) hypertension: Secondary | ICD-10-CM | POA: Diagnosis not present

## 2014-08-10 DIAGNOSIS — M25551 Pain in right hip: Secondary | ICD-10-CM | POA: Insufficient documentation

## 2014-08-10 DIAGNOSIS — R51 Headache: Secondary | ICD-10-CM | POA: Diagnosis not present

## 2014-08-10 DIAGNOSIS — E119 Type 2 diabetes mellitus without complications: Secondary | ICD-10-CM | POA: Diagnosis not present

## 2014-08-10 DIAGNOSIS — E669 Obesity, unspecified: Secondary | ICD-10-CM | POA: Diagnosis not present

## 2014-08-10 MED ORDER — OXYCODONE-ACETAMINOPHEN 5-325 MG PO TABS
1.0000 | ORAL_TABLET | Freq: Once | ORAL | Status: AC
  Administered 2014-08-10: 1 via ORAL
  Filled 2014-08-10: qty 1

## 2014-08-10 NOTE — ED Notes (Addendum)
The patient's wife, Tagen Milby is taking the patient home.  The wife said they would come back tomorrow because her husband did not want to wait any longer.  The patient left without being seen after triage.

## 2014-08-10 NOTE — ED Notes (Signed)
Pt c/o right hip pain and HA x 4 days

## 2014-08-11 ENCOUNTER — Emergency Department (HOSPITAL_COMMUNITY): Payer: Medicare HMO

## 2014-08-11 ENCOUNTER — Encounter (HOSPITAL_COMMUNITY): Payer: Self-pay

## 2014-08-11 ENCOUNTER — Emergency Department (HOSPITAL_COMMUNITY)
Admission: EM | Admit: 2014-08-11 | Discharge: 2014-08-11 | Disposition: A | Discharge status: Home / Self Care | Payer: Medicare HMO | Attending: Emergency Medicine | Admitting: Emergency Medicine

## 2014-08-11 DIAGNOSIS — E669 Obesity, unspecified: Secondary | ICD-10-CM | POA: Insufficient documentation

## 2014-08-11 DIAGNOSIS — G629 Polyneuropathy, unspecified: Secondary | ICD-10-CM | POA: Diagnosis not present

## 2014-08-11 DIAGNOSIS — R519 Headache, unspecified: Secondary | ICD-10-CM

## 2014-08-11 DIAGNOSIS — E785 Hyperlipidemia, unspecified: Secondary | ICD-10-CM | POA: Diagnosis not present

## 2014-08-11 DIAGNOSIS — Z87891 Personal history of nicotine dependence: Secondary | ICD-10-CM | POA: Insufficient documentation

## 2014-08-11 DIAGNOSIS — I10 Essential (primary) hypertension: Secondary | ICD-10-CM | POA: Diagnosis not present

## 2014-08-11 DIAGNOSIS — M1990 Unspecified osteoarthritis, unspecified site: Secondary | ICD-10-CM | POA: Insufficient documentation

## 2014-08-11 DIAGNOSIS — M25551 Pain in right hip: Secondary | ICD-10-CM | POA: Insufficient documentation

## 2014-08-11 DIAGNOSIS — Z8719 Personal history of other diseases of the digestive system: Secondary | ICD-10-CM | POA: Diagnosis not present

## 2014-08-11 DIAGNOSIS — R51 Headache: Secondary | ICD-10-CM | POA: Diagnosis present

## 2014-08-11 DIAGNOSIS — E119 Type 2 diabetes mellitus without complications: Secondary | ICD-10-CM | POA: Diagnosis not present

## 2014-08-11 DIAGNOSIS — Z79899 Other long term (current) drug therapy: Secondary | ICD-10-CM | POA: Insufficient documentation

## 2014-08-11 LAB — CBC WITH DIFFERENTIAL/PLATELET
BASOS ABS: 0 10*3/uL (ref 0.0–0.1)
BASOS PCT: 0 % (ref 0–1)
Eosinophils Absolute: 0.2 10*3/uL (ref 0.0–0.7)
Eosinophils Relative: 2 % (ref 0–5)
HCT: 51.7 % (ref 39.0–52.0)
Hemoglobin: 18 g/dL — ABNORMAL HIGH (ref 13.0–17.0)
LYMPHS ABS: 2.3 10*3/uL (ref 0.7–4.0)
Lymphocytes Relative: 28 % (ref 12–46)
MCH: 31.7 pg (ref 26.0–34.0)
MCHC: 34.8 g/dL (ref 30.0–36.0)
MCV: 91 fL (ref 78.0–100.0)
MONO ABS: 0.6 10*3/uL (ref 0.1–1.0)
Monocytes Relative: 7 % (ref 3–12)
Neutro Abs: 5.1 10*3/uL (ref 1.7–7.7)
Neutrophils Relative %: 63 % (ref 43–77)
PLATELETS: 205 10*3/uL (ref 150–400)
RBC: 5.68 MIL/uL (ref 4.22–5.81)
RDW: 13.9 % (ref 11.5–15.5)
WBC: 8.2 10*3/uL (ref 4.0–10.5)

## 2014-08-11 LAB — COMPREHENSIVE METABOLIC PANEL
ALBUMIN: 2.9 g/dL — AB (ref 3.5–5.2)
ALK PHOS: 90 U/L (ref 39–117)
ALT: 20 U/L (ref 0–53)
AST: 21 U/L (ref 0–37)
Anion gap: 11 (ref 5–15)
BUN: 16 mg/dL (ref 6–23)
CHLORIDE: 100 mmol/L (ref 96–112)
CO2: 23 mmol/L (ref 19–32)
Calcium: 9.1 mg/dL (ref 8.4–10.5)
Creatinine, Ser: 1.15 mg/dL (ref 0.50–1.35)
GFR calc non Af Amer: 57 mL/min — ABNORMAL LOW (ref >90)
GFR, EST AFRICAN AMERICAN: 66 mL/min — AB (ref >90)
Glucose, Bld: 310 mg/dL — ABNORMAL HIGH (ref 70–99)
Potassium: 4.9 mmol/L (ref 3.5–5.1)
SODIUM: 134 mmol/L — AB (ref 135–145)
Total Bilirubin: 0.5 mg/dL (ref 0.3–1.2)
Total Protein: 6.7 g/dL (ref 6.0–8.3)

## 2014-08-11 LAB — SEDIMENTATION RATE: SED RATE: 9 mm/h (ref 0–16)

## 2014-08-11 MED ORDER — HYDROMORPHONE HCL 1 MG/ML IJ SOLN
1.0000 mg | Freq: Once | INTRAMUSCULAR | Status: AC
  Administered 2014-08-11: 1 mg via INTRAVENOUS
  Filled 2014-08-11: qty 1

## 2014-08-11 MED ORDER — METOCLOPRAMIDE HCL 5 MG/ML IJ SOLN
10.0000 mg | Freq: Once | INTRAMUSCULAR | Status: AC
  Administered 2014-08-11: 10 mg via INTRAVENOUS
  Filled 2014-08-11: qty 2

## 2014-08-11 MED ORDER — HYDROCODONE-ACETAMINOPHEN 10-325 MG PO TABS: 1.0000 | ORAL_TABLET | Freq: Four times a day (QID) | ORAL | Status: AC | PRN

## 2014-08-11 MED ORDER — DIPHENHYDRAMINE HCL 50 MG/ML IJ SOLN
25.0000 mg | Freq: Once | INTRAMUSCULAR | Status: AC
  Administered 2014-08-11: 25 mg via INTRAVENOUS
  Filled 2014-08-11: qty 1

## 2014-08-11 MED ORDER — SODIUM CHLORIDE 0.9 % IV BOLUS (SEPSIS)
1000.0000 mL | Freq: Once | INTRAVENOUS | Status: AC
  Administered 2014-08-11: 1000 mL via INTRAVENOUS

## 2014-08-11 NOTE — Discharge Instructions (Signed)
Take vicodin as needed for pain.   Take tylenol for mild pain.   See your primary care doctor and orthopedic doctor.   Return to ER if you have worse pain, vomiting, fevers, can't walk.

## 2014-08-11 NOTE — ED Notes (Addendum)
Pt reports generalized HA x 1 week with "pounding behind my eyes." Denies taking anything for pain. Pt also reports right hip pain. Denies injury. Neuro intact. VSS.

## 2014-08-11 NOTE — ED Provider Notes (Addendum)
CSN: [id number]     Arrival date & time 08/11/14  1044 History   First MD Initiated Contact with Patient 08/11/14 1109     Chief Complaint  Patient presents with  . Headache     (Consider location/radiation/quality/duration/timing/severity/associated sxs/prior Treatment) The history is provided by the patient.  [name] is a 81 y.o. male hx of DM, HTN HL here with headache, right hip pain. 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 Has been in the ER several times for similar symptoms. Over the last week or so he has worsening headaches. Headache is mostly on the right side and is associated with some nausea but no vomiting. She has chronic blurry vision that may have gotten worse over the last 2 days. Came here yesterday but left without being seen. Also has chronic right hip pain. Has been seen an orthopedic doctor and had a recent hip injection but didn't help with the pain. Denies recent falls or fever or chills or neck pain.   Past Medical History  Diagnosis Date  . Diabetes mellitus   . Hypertension   . Obesity   . H/O hiatal hernia   . Arthritis   . Hyperlipidemia   . Neuropathy    Past Surgical History  Procedure Laterality Date  . Abdominal surgery      pt has abdominal scarring and states he had sx but not sure what kind  . Back surgery    . Cholecystectomy     Family History  Problem Relation Age of Onset  . Cancer Mother    History  Substance Use Topics  . Smoking status: Former Smoker    Types: Cigarettes    Quit date: 12/30/1996  . Smokeless tobacco: Never Used  . Alcohol Use: No    Review of Systems  Musculoskeletal:       R hip pain  Neurological: Positive for headaches.  All other systems reviewed and are negative.     Allergies  Review of patient's allergies indicates no known allergies.  Home Medications   Prior to Admission medications   Medication Sig Start Date End Date Taking? Authorizing  Provider  atorvastatin (LIPITOR) 20 MG tablet  01/22/14   Historical Provider, MD  gabapentin (NEURONTIN) 400 MG capsule Take 1 capsule (400 mg total) by mouth 3 (three) times daily. 12/26/13   Charlett Blake, MD  HYDROcodone-acetaminophen (NORCO) 10-325 MG per tablet Take 1 tablet by mouth 2 (two) times daily. For pain 08/31/13   Birdie Riddle, MD  nitroGLYCERIN (NITROSTAT) 0.4 MG SL tablet Place 1 tablet (0.4 mg total) under the tongue every 5 (five) minutes x 3 doses as needed for chest pain. 08/31/13   Birdie Riddle, MD  PROAIR HFA 108 (90 BASE) MCG/ACT inhaler  02/11/14   Historical Provider, MD  SURE COMFORT PEN NEEDLES 31G X 8 MM MISC 1 each by Other route daily. 01/07/14   Historical Provider, MD  VICTOZA 18 MG/3ML SOPN Inject 0.6 mg into the skin daily. 01/05/14   Historical Provider, MD   BP 117/74 mmHg  Pulse 107  Temp(Src) 98.3 F (36.8 C) (Oral)  Resp 17  SpO2 96% Physical Exam  Constitutional: He is oriented to person, place, and time.  Chronically ill, uncomfortable   HENT:  Head: Normocephalic and atraumatic.  Mouth/Throat: Oropharynx is clear and moist.  Eyes: EOM are normal. Pupils are equal, round, and reactive to light.  R temple area tenderness  Neck: Normal range of motion. Neck supple.  Cardiovascular: Normal rate, regular rhythm and normal heart sounds.   Pulmonary/Chest: Effort normal and breath sounds normal. No respiratory distress. He has no wheezes. He has no rales.  Abdominal: Soft. Bowel sounds are normal. He exhibits no distension. There is no tenderness. There is no rebound.  Musculoskeletal: Normal range of motion.  Dec ROM R hip. No obvious deformity   Neurological: He is alert and oriented to person, place, and time.  Skin: Skin is warm and dry.  Psychiatric: He has a normal mood and affect. His behavior is normal. Judgment and thought content normal.  Nursing note and vitals reviewed.   ED Course  Procedures (including critical care time) Labs  Review Labs Reviewed  CBC WITH DIFFERENTIAL/PLATELET - Abnormal; Notable for the following:    Hemoglobin 18.0 (*)    All other components within normal limits  COMPREHENSIVE METABOLIC PANEL - Abnormal; Notable for the following:    Sodium 134 (*)    Glucose, Bld 310 (*)    Albumin 2.9 (*)    GFR calc non Af Amer 57 (*)    GFR calc Af Amer 66 (*)    All other components within normal limits  SEDIMENTATION RATE    Imaging Review Ct Head Wo Contrast  08/11/2014   CLINICAL DATA:  Generalized headache for 1 week with pounding behind the eyes  EXAM: CT HEAD WITHOUT CONTRAST  TECHNIQUE: Contiguous axial images were obtained from the base of the skull through the vertex without intravenous contrast.  COMPARISON:  01/20/2014  FINDINGS: Skull and Sinuses:Sclerotic focus in the right occipital condyle is stable from prior and has an appearance consistent with bone island.  No fracture or destructive process. No sinus or mastoid effusion to explain headache.  Orbits: No acute abnormality.  Brain: No evidence of acute infarction, hemorrhage, hydrocephalus, or mass lesion/mass effect.  IMPRESSION: Negative head CT.   Electronically Signed   By: Monte Fantasia M.D.   On: 08/11/2014 13:50   Dg Hip Unilat With Pelvis 2-3 Views Right  08/11/2014   CLINICAL DATA:  Pain following fall  EXAM: RIGHT HIP (WITH PELVIS) 2-3 VIEWS  COMPARISON:  None.  FINDINGS: Frontal pelvis as well as frontal and lateral right hip images were obtained. There is no fracture or dislocation. There is moderate symmetric narrowing of both hip joints with mild bony overgrowth along each lateral acetabulum. No erosive change.  IMPRESSION: Osteoarthritic change in both hip joints. No fracture or dislocation.   Electronically Signed   By: Lowella Grip [name] M.D.   On: 08/11/2014 13:18     EKG Interpretation None      MDM   Final diagnoses:  Hip pain, acute, right    [name] is a 81 y.o. male here with r hip pain  headaches. Some temple area tenderness so will get ESR to r/o temporal arteritis. I doubt SAH so if CT neg, doesn't need LP.   3:10 PM CBG 310, nl AG. ESR nl, temporal arteritis unlikely. CT head unremarkable. Xray showed hip arthritis. Pain improved with migraine cocktail. Will dc home with prn vicodin for hip pain.     Wandra Arthurs, MD 08/11/14 1511  Wandra Arthurs, MD 08/11/14 [id number]

## 2014-08-28 ENCOUNTER — Encounter (HOSPITAL_COMMUNITY): Payer: Self-pay | Admitting: Emergency Medicine

## 2014-08-28 ENCOUNTER — Emergency Department (HOSPITAL_COMMUNITY)
Admission: EM | Admit: 2014-08-28 | Discharge: 2014-08-29 | Disposition: A | Discharge status: Home / Self Care | Payer: Commercial Managed Care - HMO | Attending: Emergency Medicine | Admitting: Emergency Medicine

## 2014-08-28 DIAGNOSIS — Z87891 Personal history of nicotine dependence: Secondary | ICD-10-CM | POA: Insufficient documentation

## 2014-08-28 DIAGNOSIS — E785 Hyperlipidemia, unspecified: Secondary | ICD-10-CM | POA: Insufficient documentation

## 2014-08-28 DIAGNOSIS — E669 Obesity, unspecified: Secondary | ICD-10-CM | POA: Diagnosis not present

## 2014-08-28 DIAGNOSIS — Z8719 Personal history of other diseases of the digestive system: Secondary | ICD-10-CM | POA: Diagnosis not present

## 2014-08-28 DIAGNOSIS — M1990 Unspecified osteoarthritis, unspecified site: Secondary | ICD-10-CM | POA: Diagnosis not present

## 2014-08-28 DIAGNOSIS — Z79899 Other long term (current) drug therapy: Secondary | ICD-10-CM | POA: Diagnosis not present

## 2014-08-28 DIAGNOSIS — R42 Dizziness and giddiness: Secondary | ICD-10-CM | POA: Diagnosis present

## 2014-08-28 DIAGNOSIS — R51 Headache: Secondary | ICD-10-CM | POA: Diagnosis not present

## 2014-08-28 DIAGNOSIS — H538 Other visual disturbances: Secondary | ICD-10-CM | POA: Diagnosis not present

## 2014-08-28 DIAGNOSIS — R600 Localized edema: Secondary | ICD-10-CM | POA: Insufficient documentation

## 2014-08-28 DIAGNOSIS — G629 Polyneuropathy, unspecified: Secondary | ICD-10-CM | POA: Insufficient documentation

## 2014-08-28 DIAGNOSIS — E119 Type 2 diabetes mellitus without complications: Secondary | ICD-10-CM | POA: Insufficient documentation

## 2014-08-28 DIAGNOSIS — R202 Paresthesia of skin: Secondary | ICD-10-CM | POA: Diagnosis not present

## 2014-08-28 DIAGNOSIS — R200 Anesthesia of skin: Secondary | ICD-10-CM | POA: Insufficient documentation

## 2014-08-28 DIAGNOSIS — I10 Essential (primary) hypertension: Secondary | ICD-10-CM | POA: Diagnosis not present

## 2014-08-28 LAB — I-STAT TROPONIN, ED: Troponin i, poc: 0 ng/mL (ref 0.00–0.08)

## 2014-08-28 LAB — CBC WITH DIFFERENTIAL/PLATELET
Basophils Absolute: 0 10*3/uL (ref 0.0–0.1)
Basophils Relative: 0 % (ref 0–1)
EOS ABS: 0.4 10*3/uL (ref 0.0–0.7)
Eosinophils Relative: 5 % (ref 0–5)
HEMATOCRIT: 52.1 % — AB (ref 39.0–52.0)
Hemoglobin: 17.8 g/dL — ABNORMAL HIGH (ref 13.0–17.0)
LYMPHS PCT: 39 % (ref 12–46)
Lymphs Abs: 3.1 10*3/uL (ref 0.7–4.0)
MCH: 30.8 pg (ref 26.0–34.0)
MCHC: 34.2 g/dL (ref 30.0–36.0)
MCV: 90.1 fL (ref 78.0–100.0)
MONO ABS: 0.6 10*3/uL (ref 0.1–1.0)
Monocytes Relative: 7 % (ref 3–12)
Neutro Abs: 3.8 10*3/uL (ref 1.7–7.7)
Neutrophils Relative %: 49 % (ref 43–77)
Platelets: 176 10*3/uL (ref 150–400)
RBC: 5.78 MIL/uL (ref 4.22–5.81)
RDW: 13.7 % (ref 11.5–15.5)
WBC: 7.9 10*3/uL (ref 4.0–10.5)

## 2014-08-28 LAB — BASIC METABOLIC PANEL
Anion gap: 12 (ref 5–15)
BUN: 15 mg/dL (ref 6–23)
CHLORIDE: 101 mmol/L (ref 96–112)
CO2: 22 mmol/L (ref 19–32)
CREATININE: 1.25 mg/dL (ref 0.50–1.35)
Calcium: 9.4 mg/dL (ref 8.4–10.5)
GFR calc Af Amer: 60 mL/min — ABNORMAL LOW (ref >90)
GFR calc non Af Amer: 52 mL/min — ABNORMAL LOW (ref >90)
Glucose, Bld: 196 mg/dL — ABNORMAL HIGH (ref 70–99)
Potassium: 4.6 mmol/L (ref 3.5–5.1)
Sodium: 135 mmol/L (ref 135–145)

## 2014-08-28 LAB — CBG MONITORING, ED: Glucose-Capillary: 211 mg/dL — ABNORMAL HIGH (ref 70–99)

## 2014-08-28 MED ORDER — SODIUM CHLORIDE 0.9 % IV BOLUS (SEPSIS): 1000.0000 mL | Freq: Once | INTRAVENOUS | Status: DC

## 2014-08-28 MED ORDER — MECLIZINE HCL 25 MG PO TABS
50.0000 mg | ORAL_TABLET | Freq: Once | ORAL | Status: AC
Start: 2014-08-29 — End: 2014-08-29
  Administered 2014-08-29: 50 mg via ORAL
  Filled 2014-08-28: qty 2

## 2014-08-28 NOTE — ED Provider Notes (Signed)
CSN: [id number]     Arrival date & time 08/28/14  4098 History  This chart was scribed for Everlene Balls, MD by Jeanell Sparrow, ED Scribe. This patient was seen in room A09C/A09C and the patient's care was started at 11:48 PM.   Chief Complaint  Patient presents with  . Dizziness   The history is provided by the patient. No language interpreter was used.   HPI Comments: JAMILL WETMORE is a 81 y.o. male who presents to the Emergency Department complaining of constant moderate dizziness that started a few days ago. He reports that he has been having worsening room spinning dizziness with a constant and chronic moderate headache, and blurry vision. He states that bending over exacerbates his dizziness. He denies dropping objects or falling. He states that he has some numbness in his toes, but that is due to his hx of DM. He denies any hx of vertigo, stroke, MI, COPD, or asthma. He also denies any facial numbness, or numbness in hands, slurred speech, difficulty eating, fever, cough, vomiting, or diarrhea.   Past Medical History  Diagnosis Date  . Diabetes mellitus   . Hypertension   . Obesity   . H/O hiatal hernia   . Arthritis   . Hyperlipidemia   . Neuropathy    Past Surgical History  Procedure Laterality Date  . Abdominal surgery      pt has abdominal scarring and states he had sx but not sure what kind  . Back surgery    . Cholecystectomy     Family History  Problem Relation Age of Onset  . Cancer Mother    History  Substance Use Topics  . Smoking status: Former Smoker    Types: Cigarettes    Quit date: 12/30/1996  . Smokeless tobacco: Never Used  . Alcohol Use: No    Review of Systems 10 Systems reviewed and all are negative for acute change except as noted in the HPI.  Allergies  Review of patient's allergies indicates no known allergies.  Home Medications   Prior to Admission medications   Medication Sig Start Date End Date Taking? Authorizing Provider   atorvastatin (LIPITOR) 20 MG tablet  01/22/14   Historical Provider, MD  gabapentin (NEURONTIN) 400 MG capsule Take 1 capsule (400 mg total) by mouth 3 (three) times daily. 12/26/13   Charlett Blake, MD  HYDROcodone-acetaminophen (NORCO) 10-325 MG per tablet Take 1 tablet by mouth every 6 (six) hours as needed. 08/11/14   Wandra Arthurs, MD  nitroGLYCERIN (NITROSTAT) 0.4 MG SL tablet Place 1 tablet (0.4 mg total) under the tongue every 5 (five) minutes x 3 doses as needed for chest pain. 08/31/13   Birdie Riddle, MD  PROAIR HFA 108 (90 BASE) MCG/ACT inhaler  02/11/14   Historical Provider, MD  SURE COMFORT PEN NEEDLES 31G X 8 MM MISC 1 each by Other route daily. 01/07/14   Historical Provider, MD  VICTOZA 18 MG/3ML SOPN Inject 0.6 mg into the skin daily. 01/05/14   Historical Provider, MD   BP 102/71 mmHg  Pulse 108  Temp(Src) 98.1 F (36.7 C) (Oral)  Resp 16  SpO2 100% Physical Exam  Constitutional: He is oriented to person, place, and time. Vital signs are normal. He appears well-developed and well-nourished.  Non-toxic appearance. He does not appear ill. No distress.  HENT:  Head: Normocephalic and atraumatic.  Nose: Nose normal.  Mouth/Throat: Oropharynx is clear and moist. No oropharyngeal exudate.  Eyes: Conjunctivae and EOM are  normal. Pupils are equal, round, and reactive to light. No scleral icterus.  Neck: Normal range of motion. Neck supple. No tracheal deviation, no edema, no erythema and normal range of motion present. No thyroid mass and no thyromegaly present.  Cardiovascular: Normal rate, regular rhythm, S1 normal, S2 normal, normal heart sounds, intact distal pulses and normal pulses.  Exam reveals no gallop and no friction rub.   No murmur heard. Pulses:      Radial pulses are 2+ on the right side, and 2+ on the left side.       Dorsalis pedis pulses are 2+ on the right side, and 2+ on the left side.  Pulmonary/Chest: Effort normal and breath sounds normal. No respiratory  distress. He has no wheezes. He has no rhonchi. He has no rales.  Abdominal: Soft. Normal appearance and bowel sounds are normal. He exhibits no distension, no ascites and no mass. There is no hepatosplenomegaly. There is no tenderness. There is no rebound, no guarding and no CVA tenderness.  Midline abdominal surgical scar.   Musculoskeletal: Normal range of motion. He exhibits edema. He exhibits no tenderness.  Bliateral lower edema.   Lymphadenopathy:    He has no cervical adenopathy.  Neurological: He is alert and oriented to person, place, and time. He has normal strength. No cranial nerve deficit or sensory deficit.  4/5 strength in the right upper and lower extremities. 5/5 strength in the left upper and lower extremities.  Normal cerebellar testing but ataxic gait.  Chronic numbness and tingling of the hands and feet.   Skin: Skin is warm, dry and intact. No petechiae and no rash noted. He is not diaphoretic. No erythema. No pallor.  Psychiatric: He has a normal mood and affect. His behavior is normal. Judgment normal.  Nursing note and vitals reviewed.   ED Course  Procedures (including critical care time) DIAGNOSTIC STUDIES: Oxygen Saturation is 100% on RA, normal by my interpretation.    COORDINATION OF CARE: 11:52 PM- Pt advised of plan for treatment which includes medication, radiology, and labs and pt agrees.  Labs Review Labs Reviewed  BASIC METABOLIC PANEL - Abnormal; Notable for the following:    Glucose, Bld 196 (*)    GFR calc non Af Amer 52 (*)    GFR calc Af Amer 60 (*)    All other components within normal limits  CBC WITH DIFFERENTIAL/PLATELET - Abnormal; Notable for the following:    Hemoglobin 17.8 (*)    HCT 52.1 (*)    All other components within normal limits  CBG MONITORING, ED - Abnormal; Notable for the following:    Glucose-Capillary 211 (*)    All other components within normal limits  ETHANOL  URINALYSIS, ROUTINE W REFLEX MICROSCOPIC  I-STAT  TROPOININ, ED    Imaging Review Dg Chest 2 View  08/29/2014   CLINICAL DATA:  Constant moderate dizziness beginning a few days ago, headache, blurred vision. History of diabetes.  EXAM: CHEST  2 VIEW  COMPARISON:  Chest radiograph March 23, 2014  FINDINGS: The cardiac silhouette is moderately enlarged, unchanged. Mildly widened mediastinum, unchanged may reflect aortic ectasia. Similar chronic interstitial changes with RIGHT upper lobe atelectasis/scarring. No pleural effusion or focal consolidation. No pneumothorax.  Surgical clips in the included right abdomen likely reflect cholecystectomy. Mild degenerative change of the thoracic spine.  IMPRESSION: Stable cardiomegaly. Chronic interstitial changes with RIGHT upper lobe atelectasis/scarring.   Electronically Signed   By: Elon Alas   On: 08/29/2014 00:33  Mr Brain Wo Contrast  08/29/2014   CLINICAL DATA:  Moderate dizziness beginning a few days ago, worsening now with chronic moderate headache and blurred vision, worse with bending over. Toe numbness attributed to diabetes. History of hypertension, polycythemia, hyperlipidemia and neuropathy.  EXAM: MRI HEAD WITHOUT CONTRAST  TECHNIQUE: Multiplanar, multiecho pulse sequences of the brain and surrounding structures were obtained without intravenous contrast.  COMPARISON:  CT of the head August 11, 2014  FINDINGS: No reduced diffusion to suggest acute ischemia. No susceptibility artifact to suggest hemorrhage.  Ventricles and sulci are normal for patient's age, cavum vergae, normal variant. Minimal white matter changes suggest chronic small vessel ischemic disease, less than expected for age. No midline shift, mass effect or mass lesions.  No abnormal extra-axial fluid collections. Normal major intracranial vascular flow voids observed at the skull base. Mild dolichoectatic appearance of intracranial vessels can be seen with patient's hypertension.  Ocular globes and orbital contents are  unremarkable. No paranasal sinus air-fluid levels. Mastoid air cells are well aerated. Mild temporomandibular osteoarthrosis. No abnormal sellar expansion. No cerebellar tonsillar ectopia. Mild degenerative change of the included cervical spine.  IMPRESSION: No acute intracranial process ; normal noncontrast MRI of the brain for age.   Electronically Signed   By: Elon Alas   On: 08/29/2014 01:38     EKG Interpretation   Date/Time:  Friday August 28 2014 18:42:05 EST Ventricular Rate:  106 PR Interval:  228 QRS Duration: 88 QT Interval:  324 QTC Calculation: 430 R Axis:   82 Text Interpretation:  Sinus tachycardia with 1st degree A-V block  Otherwise normal ECG No significant change since last tracing Confirmed by  Glynn Octave [phone]) on 08/28/2014 11:44:41 PM      MDM   Final diagnoses:  Dizziness   Patient presents to emergency department for dizziness for the past week. It appears to be worse with movement, consistent with vertigo. Due to the patient's age and risk factors, posterior circulation stroke cannot be ruled out. Will obtain MRI for evaluation. Patient was also given IV fluids and meclizine in the emergency department. Infectious workup will be obtained.  MRI of brain is normal and does not reveal an acute stroke.  Patient was rehydrated with oral fluids, tachycardia resolved to 93. Meclizine didn't relieve his symptoms we'll discharge with a prescription to take as needed. Neurology follow-up was given. His vital signs remain within his normal limits and he is safe for discharge.  I personally performed the services described in this documentation, which was scribed in my presence. The recorded information has been reviewed and is accurate.    Everlene Balls, MD 08/29/14 [phone]

## 2014-08-28 NOTE — ED Notes (Signed)
Pt sts dizziness worse with standing and some right hip pain x several days; pt sts CBG was 332 at PCP yesterday

## 2014-08-29 ENCOUNTER — Emergency Department (HOSPITAL_COMMUNITY): Payer: Commercial Managed Care - HMO

## 2014-08-29 LAB — ETHANOL: Alcohol, Ethyl (B): <5 mg/dL (ref 0–9)

## 2014-08-29 MED ORDER — MECLIZINE HCL 50 MG PO TABS: 25.0000 mg | ORAL_TABLET | Freq: Three times a day (TID) | ORAL | Status: DC | PRN

## 2014-08-29 NOTE — ED Notes (Signed)
Pt reports nausea x 2 weeks as well as some dizziness when he stands up or bends over. Pt reports trouble sleeping at night. Respirations equal and unlabored, alert, oriented, nad.

## 2014-08-29 NOTE — Discharge Instructions (Signed)
Vertigo Mr. Begue, your MRI did not show any sign of stroke. Your dizziness is likely from vertigo. Take medication as prescribed and follow-up with neurology for continued management. If symptoms worsen come back to the emergency department immediately. Thank you. Vertigo means you feel like you are moving when you are not. Vertigo can make you feel like things around you are moving when they are not. This problem often goes away on its own.  HOME CARE   Follow your doctor's instructions.  Avoid driving.  Avoid using heavy machinery.  Avoid doing any activity that could be dangerous if you have a vertigo attack.  Tell your doctor if a medicine seems to cause your vertigo. GET HELP RIGHT AWAY IF:   Your medicines do not help or make you feel worse.  You have trouble talking or walking.  You feel weak or have trouble using your arms, hands, or legs.  You have bad headaches.  You keep feeling sick to your stomach (nauseous) or throwing up (vomiting).  Your vision changes.  A family member notices changes in your behavior.  Your problems get worse. MAKE SURE YOU:  Understand these instructions.  Will watch your condition.  Will get help right away if you are not doing well or get worse. Document Released: 03/28/2008 Document Revised: 09/11/2011 Document Reviewed: 01/05/2011 [facility] Patient Information 2015 [place], [place]. This information is not intended to replace advice given to you by your health care provider. Make sure you discuss any questions you have with your health care provider.

## 2014-08-29 NOTE — ED Notes (Signed)
Patient on his 4th cup of po fluids at this time.

## 2014-08-29 NOTE — ED Notes (Signed)
Pt reports he is a very hard stick, Dr. Claudine Mouton advised we can administer PO fluids at this time and see how patient does.

## 2014-09-02 ENCOUNTER — Emergency Department (HOSPITAL_COMMUNITY): Payer: Medicare HMO

## 2014-09-02 ENCOUNTER — Encounter (HOSPITAL_COMMUNITY): Payer: Self-pay | Admitting: *Deleted

## 2014-09-02 ENCOUNTER — Emergency Department (HOSPITAL_COMMUNITY)
Admission: EM | Admit: 2014-09-02 | Discharge: 2014-09-02 | Disposition: A | Discharge status: Home / Self Care | Payer: Medicare HMO | Attending: Emergency Medicine | Admitting: Emergency Medicine

## 2014-09-02 DIAGNOSIS — E785 Hyperlipidemia, unspecified: Secondary | ICD-10-CM | POA: Diagnosis not present

## 2014-09-02 DIAGNOSIS — Z79899 Other long term (current) drug therapy: Secondary | ICD-10-CM | POA: Diagnosis not present

## 2014-09-02 DIAGNOSIS — E669 Obesity, unspecified: Secondary | ICD-10-CM | POA: Diagnosis not present

## 2014-09-02 DIAGNOSIS — I10 Essential (primary) hypertension: Secondary | ICD-10-CM | POA: Insufficient documentation

## 2014-09-02 DIAGNOSIS — Z87891 Personal history of nicotine dependence: Secondary | ICD-10-CM | POA: Diagnosis not present

## 2014-09-02 DIAGNOSIS — M25551 Pain in right hip: Secondary | ICD-10-CM | POA: Diagnosis not present

## 2014-09-02 DIAGNOSIS — M1990 Unspecified osteoarthritis, unspecified site: Secondary | ICD-10-CM | POA: Diagnosis not present

## 2014-09-02 DIAGNOSIS — M79672 Pain in left foot: Secondary | ICD-10-CM | POA: Diagnosis not present

## 2014-09-02 DIAGNOSIS — Z8719 Personal history of other diseases of the digestive system: Secondary | ICD-10-CM | POA: Diagnosis not present

## 2014-09-02 DIAGNOSIS — G629 Polyneuropathy, unspecified: Secondary | ICD-10-CM | POA: Insufficient documentation

## 2014-09-02 DIAGNOSIS — E119 Type 2 diabetes mellitus without complications: Secondary | ICD-10-CM | POA: Insufficient documentation

## 2014-09-02 DIAGNOSIS — Z794 Long term (current) use of insulin: Secondary | ICD-10-CM | POA: Insufficient documentation

## 2014-09-02 DIAGNOSIS — M79671 Pain in right foot: Secondary | ICD-10-CM

## 2014-09-02 DIAGNOSIS — R200 Anesthesia of skin: Secondary | ICD-10-CM | POA: Diagnosis not present

## 2014-09-02 MED ORDER — HYDROCODONE-ACETAMINOPHEN 5-325 MG PO TABS: 1.0000 | ORAL_TABLET | Freq: Four times a day (QID) | ORAL | Status: DC | PRN

## 2014-09-02 MED ORDER — HYDROCODONE-ACETAMINOPHEN 5-325 MG PO TABS
1.0000 | ORAL_TABLET | Freq: Once | ORAL | Status: AC
  Administered 2014-09-02: 1 via ORAL
  Filled 2014-09-02: qty 1

## 2014-09-02 NOTE — ED Notes (Signed)
Pt given a happy meal and a cup of ice water; d/c'd from continuous pulse oximetry and blood pressure cuff; pt getting dressed to be discharged home

## 2014-09-02 NOTE — ED Provider Notes (Addendum)
CSN: [id number]     Arrival date & time 09/02/14  0728 History   First MD Initiated Contact with Patient 09/02/14 [phone]     Chief Complaint  Patient presents with  . Foot Pain  . Hip Pain     (Consider location/radiation/quality/duration/timing/severity/associated sxs/prior Treatment) Patient is a 81 y.o. male presenting with lower extremity pain and hip pain. The history is provided by the patient.  Foot Pain Pertinent negatives include no chest pain, no abdominal pain and no shortness of breath.  Hip Pain Pertinent negatives include no chest pain, no abdominal pain and no shortness of breath.  Complaint to bilateral foot pain and right hip pain for months. No new injury. Seen 08/11/14 for hip pain with negative xrays.    Past Medical History  Diagnosis Date  . Diabetes mellitus   . Hypertension   . Obesity   . H/O hiatal hernia   . Arthritis   . Hyperlipidemia   . Neuropathy    Past Surgical History  Procedure Laterality Date  . Abdominal surgery      pt has abdominal scarring and states he had sx but not sure what kind  . Back surgery    . Cholecystectomy     Family History  Problem Relation Age of Onset  . Cancer Mother    History  Substance Use Topics  . Smoking status: Former Smoker    Types: Cigarettes    Quit date: 12/30/1996  . Smokeless tobacco: Never Used  . Alcohol Use: No    Review of Systems  Constitutional: Negative for fever.  HENT: Negative for congestion.   Eyes: Negative for redness.  Respiratory: Negative for shortness of breath.   Cardiovascular: Negative for chest pain.  Gastrointestinal: Negative for abdominal pain.  Genitourinary: Negative for dysuria.  Musculoskeletal: Positive for arthralgias.  Skin: Negative for rash.  Allergic/Immunologic: Positive for immunocompromised state.  Neurological: Positive for numbness.  Hematological: Does not bruise/bleed easily.  Psychiatric/Behavioral: Negative for confusion.      Allergies   Review of patient's allergies indicates no known allergies.  Home Medications   Prior to Admission medications   Medication Sig Start Date End Date Taking? Authorizing Provider  atorvastatin (LIPITOR) 20 MG tablet Take 20 mg by mouth daily.  01/22/14  Yes Historical Provider, MD  Insulin Degludec 200 UNIT/ML SOPN Inject 12 Units into the skin daily.   Yes Historical Provider, MD  meclizine (ANTIVERT) 50 MG tablet Take 0.5 tablets (25 mg total) by mouth 3 (three) times daily as needed for dizziness. 08/29/14  Yes Everlene Balls, MD  nitroGLYCERIN (NITROSTAT) 0.4 MG SL tablet Place 1 tablet (0.4 mg total) under the tongue every 5 (five) minutes x 3 doses as needed for chest pain. 08/31/13  Yes Birdie Riddle, MD  PROAIR HFA 108 (90 BASE) MCG/ACT inhaler Inhale 1 puff into the lungs every 4 (four) hours as needed for wheezing or shortness of breath.  02/11/14  Yes Historical Provider, MD  SURE COMFORT PEN NEEDLES 31G X 8 MM MISC 1 each by Other route daily. 01/07/14  Yes Historical Provider, MD  gabapentin (NEURONTIN) 400 MG capsule Take 1 capsule (400 mg total) by mouth 3 (three) times daily. Patient not taking: Reported on 08/29/2014 12/26/13   Charlett Blake, MD  HYDROcodone-acetaminophen [facility]) 10-325 MG per tablet Take 1 tablet by mouth every 6 (six) hours as needed. Patient not taking: Reported on 09/02/2014 08/11/14   Wandra Arthurs, MD  HYDROcodone-acetaminophen (NORCO/VICODIN) 5-325 MG per  tablet Take 1-2 tablets by mouth every 6 (six) hours as needed for moderate pain. 09/02/14   Fredia Sorrow, MD   BP 136/82 mmHg  Pulse 101  Temp(Src) 98.2 F (36.8 C) (Oral)  Resp 16  SpO2 96% Physical Exam  Constitutional: He is oriented to person, place, and time. He appears well-developed and well-nourished. No distress.  HENT:  Head: Normocephalic and atraumatic.  Mouth/Throat: Oropharynx is clear and moist.  Eyes: Conjunctivae and EOM are normal. Pupils are equal, round, and reactive to light.  Neck:  Normal range of motion.  Cardiovascular: Normal rate, regular rhythm and normal heart sounds.   Pulmonary/Chest: Effort normal and breath sounds normal. No respiratory distress.  Abdominal: Soft. Bowel sounds are normal. There is no tenderness.  Musculoskeletal: Normal range of motion. He exhibits no edema or tenderness.  Refill to both big toes is 2 seconds. No swelling no redness no ulcers.  Good range of right hip.  Neurological: He is alert and oriented to person, place, and time. No cranial nerve deficit. He exhibits normal muscle tone. Coordination normal.  Skin: Skin is warm.  Nursing note and vitals reviewed.   ED Course  Procedures (including critical care time) Labs Review Labs Reviewed - No data to display  Imaging Review Dg Foot Complete Left  09/02/2014   CLINICAL DATA:  Diabetic with BILATERAL pain which continues to worsen. Duration unspecified other than chronic condition.  EXAM: LEFT FOOT - COMPLETE 3+ VIEW  COMPARISON:  None.  FINDINGS: There is no evidence of fracture or dislocation. There is no evidence of arthropathy or other focal bone abnormality. Soft tissues are unremarkable.  IMPRESSION: Negative.   Electronically Signed   By: Rolla Flatten M.D.   On: 09/02/2014 09:53   Dg Foot Complete Right  09/02/2014   CLINICAL DATA:  Bilateral foot pain  EXAM: RIGHT FOOT COMPLETE - 3+ VIEW  COMPARISON:  None.  FINDINGS: Mild hallux valgus deformity is noted. No acute fracture or dislocation is noted. A small metallic foreign body is noted adjacent to the base of the first metatarsal within the medial soft tissues. Small calcaneal spur is noted.  IMPRESSION: Mild degenerative change.  Radiopaque foreign body within the soft tissues medially as described.   Electronically Signed   By: Inez Catalina M.D.   On: 09/02/2014 09:57     EKG Interpretation None      MDM   Final diagnoses:  Hip pain, right  Foot pain, bilateral     Patient with history of chronic right hip pain  and also chronic bilateral foot pain. Patient is not followed by podiatry. Patient had x-rays of the right hip done on February 9 and was given a prescription for pain medicine then. Patient didn't seem to remember this. Patient had x-rays of both feet today without the significant abnormalities. There was the findng of a radiopaque foreign body in the right foot which I do not think is playing a role. But since patient is a diabetic this could be neuropathy kind of pain and patient could also benefit from general foot care by podiatry so following a podiatry would be important. Patient will also make appointment follow-up with his regular doctor.  Fredia Sorrow, MD 09/02/14 1031  Fredia Sorrow, MD 09/02/14 [phone]

## 2014-09-02 NOTE — ED Notes (Signed)
Pt in gown, blanket given

## 2014-09-02 NOTE — ED Notes (Signed)
Patient transported to X-ray 

## 2014-09-02 NOTE — ED Notes (Signed)
Pt has returned from being out of the department for testing; pt placed on continuous pulse oximetry and blood pressure cuff; pt watching tv, no needs at this time

## 2014-09-02 NOTE — Discharge Instructions (Signed)
A follow-up with podiatry for the foot pain. He 1 foot shows an old of very small metallic foreign body do not think it's really responsible for the pain. Also follow-up with your regular doctor in the next few days. Take pain medicine as directed.

## 2014-09-02 NOTE — ED Notes (Signed)
Pt reports being seen here on Friday for same, reports having neuropathy and still having severe bilateral foot pain and right hip pain.

## 2015-01-27 ENCOUNTER — Ambulatory Visit: Payer: Medicare HMO | Admitting: Cardiology

## 2015-02-04 ENCOUNTER — Encounter: Payer: Self-pay | Admitting: Cardiology

## 2015-05-09 ENCOUNTER — Encounter (HOSPITAL_COMMUNITY): Payer: Self-pay | Admitting: Emergency Medicine

## 2015-05-09 ENCOUNTER — Emergency Department (HOSPITAL_COMMUNITY)
Admission: EM | Admit: 2015-05-09 | Discharge: 2015-05-09 | Disposition: A | Discharge status: Home / Self Care | Payer: Medicare HMO | Attending: Emergency Medicine | Admitting: Emergency Medicine

## 2015-05-09 DIAGNOSIS — Z8719 Personal history of other diseases of the digestive system: Secondary | ICD-10-CM | POA: Diagnosis not present

## 2015-05-09 DIAGNOSIS — R51 Headache: Secondary | ICD-10-CM | POA: Diagnosis not present

## 2015-05-09 DIAGNOSIS — Z794 Long term (current) use of insulin: Secondary | ICD-10-CM | POA: Diagnosis not present

## 2015-05-09 DIAGNOSIS — I10 Essential (primary) hypertension: Secondary | ICD-10-CM | POA: Diagnosis not present

## 2015-05-09 DIAGNOSIS — M1990 Unspecified osteoarthritis, unspecified site: Secondary | ICD-10-CM | POA: Insufficient documentation

## 2015-05-09 DIAGNOSIS — E785 Hyperlipidemia, unspecified: Secondary | ICD-10-CM | POA: Diagnosis not present

## 2015-05-09 DIAGNOSIS — R519 Headache, unspecified: Secondary | ICD-10-CM

## 2015-05-09 DIAGNOSIS — Z87891 Personal history of nicotine dependence: Secondary | ICD-10-CM | POA: Diagnosis not present

## 2015-05-09 DIAGNOSIS — E669 Obesity, unspecified: Secondary | ICD-10-CM | POA: Diagnosis not present

## 2015-05-09 DIAGNOSIS — Z79899 Other long term (current) drug therapy: Secondary | ICD-10-CM | POA: Diagnosis not present

## 2015-05-09 DIAGNOSIS — E119 Type 2 diabetes mellitus without complications: Secondary | ICD-10-CM | POA: Insufficient documentation

## 2015-05-09 DIAGNOSIS — G629 Polyneuropathy, unspecified: Secondary | ICD-10-CM | POA: Diagnosis not present

## 2015-05-09 MED ORDER — TRAMADOL HCL 50 MG PO TABS
50.0000 mg | ORAL_TABLET | Freq: Once | ORAL | Status: AC
  Administered 2015-05-09: 50 mg via ORAL
  Filled 2015-05-09: qty 1

## 2015-05-09 MED ORDER — METOCLOPRAMIDE HCL 5 MG/ML IJ SOLN
10.0000 mg | Freq: Once | INTRAMUSCULAR | Status: AC
  Administered 2015-05-09: 10 mg via INTRAMUSCULAR
  Filled 2015-05-09: qty 2

## 2015-05-09 MED ORDER — SUMATRIPTAN SUCCINATE 6 MG/0.5ML ~~LOC~~ SOLN
6.0000 mg | Freq: Once | SUBCUTANEOUS | Status: AC
  Administered 2015-05-09: 6 mg via SUBCUTANEOUS
  Filled 2015-05-09: qty 0.5

## 2015-05-09 MED ORDER — TRAMADOL HCL 50 MG PO TABS: 50.0000 mg | ORAL_TABLET | Freq: Four times a day (QID) | ORAL | Status: DC | PRN

## 2015-05-09 NOTE — ED Provider Notes (Signed)
CSN: [id number]     Arrival date & time 05/09/15  1308 History   First MD Initiated Contact with Patient 05/09/15 1451     Chief Complaint  Patient presents with  . Headache     (Consider location/radiation/quality/duration/timing/severity/associated sxs/prior Treatment) Patient is a 81 y.o. male presenting with headaches. The history is provided by the patient.  Headache Associated symptoms: no abdominal pain, no back pain, no congestion, no cough, no eye pain, no fever, no neck pain, no neck stiffness, no numbness, no sinus pressure, no sore throat and no weakness   Patient c/o dull frontal headaches intermittent for the past 2 weeks. Gradual onset, constant, slowly worse, now moderate-severe. C/w prior headaches. Not a 'worst' headache. No eye pain or change in vision. No neck pain or stiffness. No numbness/weakness or loss of normal functional ability. Nausea. No vomiting. No photophobia. Headaches occurs randomly during day, no specific exacerbating or alleviating factors. Not related to position or certain activities. No sinus congestion or pressure. No recent head injury or fall. No syncope.      Past Medical History  Diagnosis Date  . Diabetes mellitus   . Hypertension   . Obesity   . H/O hiatal hernia   . Arthritis   . Hyperlipidemia   . Neuropathy [facility])    Past Surgical History  Procedure Laterality Date  . Abdominal surgery      pt has abdominal scarring and states he had sx but not sure what kind  . Back surgery    . Cholecystectomy     Family History  Problem Relation Age of Onset  . Cancer Mother    Social History  Substance Use Topics  . Smoking status: Former Smoker    Types: Cigarettes    Quit date: 12/30/1996  . Smokeless tobacco: Never Used  . Alcohol Use: No    Review of Systems  Constitutional: Negative for fever and chills.  HENT: Negative for congestion, sinus pressure and sore throat.   Eyes: Negative for pain and visual disturbance.   Respiratory: Negative for cough and shortness of breath.   Cardiovascular: Negative for chest pain and leg swelling.  Gastrointestinal: Negative for abdominal pain.  Genitourinary: Negative for flank pain.  Musculoskeletal: Negative for back pain, neck pain and neck stiffness.  Skin: Negative for rash.  Neurological: Positive for headaches. Negative for syncope, weakness and numbness.  Hematological: Does not bruise/bleed easily.  Psychiatric/Behavioral: Negative for confusion.      Allergies  Review of patient's allergies indicates no known allergies.  Home Medications   Prior to Admission medications   Medication Sig Start Date End Date Taking? Authorizing Provider  atorvastatin (LIPITOR) 20 MG tablet Take 20 mg by mouth daily.  01/22/14   Historical Provider, MD  gabapentin (NEURONTIN) 400 MG capsule Take 1 capsule (400 mg total) by mouth 3 (three) times daily. Patient not taking: Reported on 08/29/2014 12/26/13   Charlett Blake, MD  HYDROcodone-acetaminophen [facility]) 10-325 MG per tablet Take 1 tablet by mouth every 6 (six) hours as needed. Patient not taking: Reported on 09/02/2014 08/11/14   Wandra Arthurs, MD  HYDROcodone-acetaminophen (NORCO/VICODIN) 5-325 MG per tablet Take 1-2 tablets by mouth every 6 (six) hours as needed for moderate pain. 09/02/14   Fredia Sorrow, MD  Insulin Degludec 200 UNIT/ML SOPN Inject 12 Units into the skin daily.    Historical Provider, MD  meclizine (ANTIVERT) 50 MG tablet Take 0.5 tablets (25 mg total) by mouth 3 (three) times daily as  needed for dizziness. 08/29/14   Everlene Balls, MD  nitroGLYCERIN (NITROSTAT) 0.4 MG SL tablet Place 1 tablet (0.4 mg total) under the tongue every 5 (five) minutes x 3 doses as needed for chest pain. 08/31/13   Dixie Dials, MD  PROAIR HFA 108 (90 BASE) MCG/ACT inhaler Inhale 1 puff into the lungs every 4 (four) hours as needed for wheezing or shortness of breath.  02/11/14   Historical Provider, MD  SURE COMFORT PEN NEEDLES  31G X 8 MM MISC 1 each by Other route daily. 01/07/14   Historical Provider, MD   BP 127/89 mmHg  Pulse 89  Temp(Src) 99.2 F (37.3 C) (Oral)  Resp 18  Ht 5' 10.5" (1.791 m)  Wt 266 lb (120.657 kg)  BMI 37.62 kg/m2  SpO2 95% Physical Exam  Constitutional: He is oriented to person, place, and time. He appears well-developed and well-nourished. No distress.  HENT:  Head: Atraumatic.  Mouth/Throat: Oropharynx is clear and moist.  No sinus or temporal tenderness.   Eyes: Conjunctivae and EOM are normal. Pupils are equal, round, and reactive to light. No scleral icterus.  Neck: Neck supple. No tracheal deviation present. No thyromegaly present.  No stiffness or rigidity  Cardiovascular: Normal rate, regular rhythm, normal heart sounds and intact distal pulses.   No murmur heard. Pulmonary/Chest: Effort normal and breath sounds normal. No accessory muscle usage. No respiratory distress.  Abdominal: Soft. Bowel sounds are normal. He exhibits no distension and no mass. There is no tenderness. There is no rebound and no guarding.  Musculoskeletal: Normal range of motion. He exhibits no edema or tenderness.  Neurological: He is alert and oriented to person, place, and time. No cranial nerve deficit.  Motor intact bil, stre 5/5. sens grossly intact. Steady gait.   Skin: Skin is warm and dry. No rash noted.  Psychiatric: He has a normal mood and affect.  Nursing note and vitals reviewed.   ED Course  Procedures (including critical care time) Labs Review    I have personally reviewed and evaluated these images and lab results as part of my medical decision-making.    MDM   Iv ns.   Reviewed nursing notes and prior charts for additional history.   Pt with multiple prior ED evals for headache, and has seen neurology for same - has had multiple ct scans for headache including earlier this year, and mri earlier this year.  Will rx for symptom relief.  imitrex sq, reglan  im.  Recheck pt resting comfortably, easily aroused.  Pt requests additional pain med, and something to eat/drink. Ultram po.  Pt also given crackers and drink - tolerated well.  Given recurrent/chronic/persistent headaches, will refer back to neurology follow up.  Pt currently appears stable for d/c.       Lajean Saver, MD 05/09/15 1625

## 2015-05-09 NOTE — ED Notes (Signed)
Pt. Stated, I've had a headache for 2 weeks.

## 2015-05-09 NOTE — Discharge Instructions (Signed)
It was our pleasure to provide your ER care today - we hope that you feel better.  Rest. Drink plenty of fluids.  You may take ultram as need for pain - no driving for the next 4 hours, or when taking ultram.  Given the recurrent/persistent nature of your headaches, follow up with primary care doctor, and/or neurologist in the next 1-2 weeks - see referral - call office Monday to arrange appointment.  Return to ER if worse, new symptoms, fevers, numbness/weakness, fainting, persistent vomiting, other concern.    General Headache Without Cause A headache is pain or discomfort felt around the head or neck area. The specific cause of a headache may not be found. There are many causes and types of headaches. A few common ones are:  Tension headaches.  Migraine headaches.  Cluster headaches.  Chronic daily headaches. HOME CARE INSTRUCTIONS  Watch your condition for any changes. Take these steps to help with your condition: Managing Pain  Take over-the-counter and prescription medicines only as told by your health care provider.  Lie down in a dark, quiet room when you have a headache.  If directed, apply ice to the head and neck area:  Put ice in a plastic bag.  Place a towel between your skin and the bag.  Leave the ice on for 20 minutes, 2-3 times per day.  Use a heating pad or hot shower to apply heat to the head and neck area as told by your health care provider.  Keep lights dim if bright lights bother you or make your headaches worse. Eating and Drinking  Eat meals on a regular schedule.  Limit alcohol use.  Decrease the amount of caffeine you drink, or stop drinking caffeine. General Instructions  Keep all follow-up visits as told by your health care provider. This is important.  Keep a headache journal to help find out what may trigger your headaches. For example, write down:  What you eat and drink.  How much sleep you get.  Any change to your diet or  medicines.  Try massage or other relaxation techniques.  Limit stress.  Sit up straight, and do not tense your muscles.  Do not use tobacco products, including cigarettes, chewing tobacco, or e-cigarettes. If you need help quitting, ask your health care provider.  Exercise regularly as told by your health care provider.  Sleep on a regular schedule. Get 7-9 hours of sleep, or the amount recommended by your health care provider. SEEK MEDICAL CARE IF:   Your symptoms are not helped by medicine.  You have a headache that is different from the usual headache.  You have nausea or you vomit.  You have a fever. SEEK IMMEDIATE MEDICAL CARE IF:   Your headache becomes severe.  You have repeated vomiting.  You have a stiff neck.  You have a loss of vision.  You have problems with speech.  You have pain in the eye or ear.  You have muscular weakness or loss of muscle control.  You lose your balance or have trouble walking.  You feel faint or pass out.  You have confusion.   This information is not intended to replace advice given to you by your health care provider. Make sure you discuss any questions you have with your health care provider.   Document Released: 06/19/2005 Document Revised: 03/10/2015 Document Reviewed: 10/12/2014 Elsevier Interactive Patient Education [employer].

## 2016-06-03 ENCOUNTER — Emergency Department (HOSPITAL_COMMUNITY): Payer: No Typology Code available for payment source

## 2016-06-03 ENCOUNTER — Emergency Department (HOSPITAL_COMMUNITY)
Admission: EM | Admit: 2016-06-03 | Discharge: 2016-06-03 | Disposition: A | Discharge status: Home / Self Care | Payer: No Typology Code available for payment source | Attending: Emergency Medicine | Admitting: Emergency Medicine

## 2016-06-03 ENCOUNTER — Encounter (HOSPITAL_COMMUNITY): Payer: Self-pay | Admitting: Emergency Medicine

## 2016-06-03 DIAGNOSIS — Y92481 Parking lot as the place of occurrence of the external cause: Secondary | ICD-10-CM | POA: Diagnosis not present

## 2016-06-03 DIAGNOSIS — Y939 Activity, unspecified: Secondary | ICD-10-CM | POA: Diagnosis not present

## 2016-06-03 DIAGNOSIS — E1122 Type 2 diabetes mellitus with diabetic chronic kidney disease: Secondary | ICD-10-CM | POA: Diagnosis not present

## 2016-06-03 DIAGNOSIS — Y999 Unspecified external cause status: Secondary | ICD-10-CM | POA: Insufficient documentation

## 2016-06-03 DIAGNOSIS — S3992XA Unspecified injury of lower back, initial encounter: Secondary | ICD-10-CM | POA: Diagnosis present

## 2016-06-03 DIAGNOSIS — I129 Hypertensive chronic kidney disease with stage 1 through stage 4 chronic kidney disease, or unspecified chronic kidney disease: Secondary | ICD-10-CM | POA: Diagnosis not present

## 2016-06-03 DIAGNOSIS — Z87891 Personal history of nicotine dependence: Secondary | ICD-10-CM | POA: Diagnosis not present

## 2016-06-03 DIAGNOSIS — Z7982 Long term (current) use of aspirin: Secondary | ICD-10-CM | POA: Insufficient documentation

## 2016-06-03 DIAGNOSIS — N182 Chronic kidney disease, stage 2 (mild): Secondary | ICD-10-CM | POA: Insufficient documentation

## 2016-06-03 DIAGNOSIS — E1140 Type 2 diabetes mellitus with diabetic neuropathy, unspecified: Secondary | ICD-10-CM | POA: Diagnosis not present

## 2016-06-03 DIAGNOSIS — S39012A Strain of muscle, fascia and tendon of lower back, initial encounter: Secondary | ICD-10-CM | POA: Diagnosis not present

## 2016-06-03 DIAGNOSIS — R0789 Other chest pain: Secondary | ICD-10-CM | POA: Diagnosis not present

## 2016-06-03 MED ORDER — TRAMADOL HCL 50 MG PO TABS
100.0000 mg | ORAL_TABLET | Freq: Once | ORAL | Status: AC
  Administered 2016-06-03: 100 mg via ORAL
  Filled 2016-06-03: qty 2

## 2016-06-03 MED ORDER — TRAMADOL HCL 50 MG PO TABS: 100.0000 mg | ORAL_TABLET | Freq: Four times a day (QID) | ORAL | 0 refills | Status: DC | PRN

## 2016-06-03 MED ORDER — ACETAMINOPHEN 500 MG PO TABS
1000.0000 mg | ORAL_TABLET | Freq: Once | ORAL | Status: AC
  Administered 2016-06-03: 1000 mg via ORAL
  Filled 2016-06-03: qty 2

## 2016-06-03 MED ORDER — ACETAMINOPHEN 500 MG PO TABS: 1000.0000 mg | ORAL_TABLET | Freq: Four times a day (QID) | ORAL | 0 refills | Status: DC | PRN

## 2016-06-03 NOTE — ED Notes (Signed)
Patient unable to urinate but bladder scan shows a low volume.
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
## 2016-06-03 NOTE — ED Notes (Signed)
Patient d/c'd from continuous pulse oximetry and blood pressure cuff; patient being discharged home; patient getting dressed
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
## 2016-06-03 NOTE — ED Notes (Signed)
Wheel chair at bedside.

## 2016-06-03 NOTE — ED Notes (Signed)
Patient still unable to urinate; urinal placed within arm's reach; will check on patient later

## 2016-10-12 ENCOUNTER — Emergency Department (HOSPITAL_COMMUNITY)
Admission: EM | Admit: 2016-10-12 | Discharge: 2016-10-12 | Disposition: A | Discharge status: Home / Self Care | Payer: Medicare PPO | Attending: Emergency Medicine | Admitting: Emergency Medicine

## 2016-10-12 DIAGNOSIS — N182 Chronic kidney disease, stage 2 (mild): Secondary | ICD-10-CM | POA: Insufficient documentation

## 2016-10-12 DIAGNOSIS — Z7982 Long term (current) use of aspirin: Secondary | ICD-10-CM | POA: Insufficient documentation

## 2016-10-12 DIAGNOSIS — N481 Balanitis: Secondary | ICD-10-CM | POA: Insufficient documentation

## 2016-10-12 DIAGNOSIS — E1140 Type 2 diabetes mellitus with diabetic neuropathy, unspecified: Secondary | ICD-10-CM | POA: Insufficient documentation

## 2016-10-12 DIAGNOSIS — N4889 Other specified disorders of penis: Secondary | ICD-10-CM | POA: Diagnosis present

## 2016-10-12 DIAGNOSIS — E1122 Type 2 diabetes mellitus with diabetic chronic kidney disease: Secondary | ICD-10-CM | POA: Insufficient documentation

## 2016-10-12 DIAGNOSIS — I129 Hypertensive chronic kidney disease with stage 1 through stage 4 chronic kidney disease, or unspecified chronic kidney disease: Secondary | ICD-10-CM | POA: Diagnosis not present

## 2016-10-12 DIAGNOSIS — Z79899 Other long term (current) drug therapy: Secondary | ICD-10-CM | POA: Insufficient documentation

## 2016-10-12 DIAGNOSIS — Z87891 Personal history of nicotine dependence: Secondary | ICD-10-CM | POA: Insufficient documentation

## 2016-10-12 LAB — URINALYSIS, MICROSCOPIC (REFLEX)

## 2016-10-12 LAB — URINALYSIS, ROUTINE W REFLEX MICROSCOPIC
Bilirubin Urine: NEGATIVE
Glucose, UA: 100 mg/dL — AB
Ketones, ur: NEGATIVE mg/dL
Nitrite: NEGATIVE
Protein, ur: 30 mg/dL — AB
SPECIFIC GRAVITY, URINE: 1.02 (ref 1.005–1.030)
pH: 5 (ref 5.0–8.0)

## 2016-10-12 MED ORDER — MICONAZOLE NITRATE 2 % EX CREA: 1.0000 "application " | TOPICAL_CREAM | Freq: Two times a day (BID) | CUTANEOUS | 0 refills | Status: DC

## 2016-10-12 MED ORDER — CEPHALEXIN 500 MG PO CAPS: 500.0000 mg | ORAL_CAPSULE | Freq: Two times a day (BID) | ORAL | 0 refills | Status: DC

## 2016-10-12 NOTE — ED Triage Notes (Signed)
Pt states he thinks he zipped his scrotum in his pant zipper a week ago. Pt has noticed a wound on his scrotum. No drainage. Pt denies pain.

## 2016-10-12 NOTE — ED Provider Notes (Signed)
[facility] DEPT Provider Note   CSN: [id number] Arrival date & time: 10/12/16  9702     History   Chief Complaint Chief Complaint  Patient presents with  . Wound Check    HPI [name] is a 83 y.o. male.  83yo M w/ PMH including T2DM, HTN, HLD, CKD who p/w penile pain. 1 week ago, the patient was sitting up his jeans when he got the tip of his penis stuck in a zipper. He states that he initially sustained a wound that wasn't bothering him but now it burns when he urinates. His pain is absent when not urinating. He denies any fevers, abdominal pain, or other associated symptoms.   The history is provided by the patient.  Wound Check     Past Medical History:  Diagnosis Date  . Arthritis   . Diabetes mellitus   . H/O hiatal hernia   . Hyperlipidemia   . Hypertension   . Neuropathy ([place])   . Obesity     Patient Active Problem List   Diagnosis Date Noted  . Painful diabetic neuropathy ([place]) 12/26/2013  . Chest pain at rest 08/29/2013  . Hyperlipidemia 01/01/2012  . Hyponatremia 12/31/2011  . CKD (chronic kidney disease), stage II 12/31/2011  . Dehydration 12/31/2011  . Dizziness 12/31/2011  . Polycythemia 12/31/2011  . Pulmonary nodule 12/31/2011  . Obesity 12/31/2011  . DM (diabetes mellitus), type 2, uncontrolled with complications ([place]) 63/78/5885  . HTN (hypertension), benign 07/10/2011  . Chest pain 07/09/2011  . Shortness of breath 07/09/2011    Past Surgical History:  Procedure Laterality Date  . ABDOMINAL SURGERY     pt has abdominal scarring and states he had sx but not sure what kind  . BACK SURGERY    . CHOLECYSTECTOMY         Home Medications    Prior to Admission medications   Medication Sig Start Date End Date Taking? Authorizing Provider  acetaminophen (TYLENOL) 500 MG tablet Take 2 tablets (1,000 mg total) by mouth every 6 (six) hours as needed. 06/03/16  Yes Charlesetta Shanks, MD  Aspirin-Salicylamide-Caffeine (BC HEADACHE  POWDER PO) Take 1 packet by mouth daily as needed (Headache).   Yes Historical Provider, MD  cyanocobalamin (,VITAMIN B-12,) 1000 MCG/ML injection Inject 1,000 mcg into the muscle every 30 (thirty) days. 10/03/16  Yes Historical Provider, MD  HYDROcodone-acetaminophen (NORCO) 10-325 MG per tablet Take 1 tablet by mouth every 6 (six) hours as needed. Patient taking differently: Take 1 tablet by mouth 4 (four) times daily.  08/11/14  Yes Drenda Freeze, MD  losartan (COZAAR) 50 MG tablet Take 50 mg by mouth daily. 05/05/15  Yes Historical Provider, MD  nabumetone (RELAFEN) 500 MG tablet Take 500 mg by mouth 2 (two) times daily. 06/02/16  Yes Historical Provider, MD  nitroGLYCERIN (NITROSTAT) 0.4 MG SL tablet Place 1 tablet (0.4 mg total) under the tongue every 5 (five) minutes x 3 doses as needed for chest pain. 08/31/13  Yes Dixie Dials, MD  PROAIR HFA 108 (90 BASE) MCG/ACT inhaler Inhale 1 puff into the lungs every 4 (four) hours as needed for wheezing or shortness of breath.  02/11/14  Yes Historical Provider, MD  tamsulosin (FLOMAX) 0.4 MG CAPS capsule Take 0.4 mg by mouth daily. 07/05/16  Yes Historical Provider, MD  traMADol (ULTRAM) 50 MG tablet Take 1 tablet (50 mg total) by mouth every 6 (six) hours as needed. 05/09/15  Yes Lajean Saver, MD  cephALEXin (KEFLEX) 500 MG capsule  Take 1 capsule (500 mg total) by mouth 2 (two) times daily. 10/12/16   Sharlett Iles, MD  gabapentin (NEURONTIN) 400 MG capsule Take 1 capsule (400 mg total) by mouth 3 (three) times daily. Patient not taking: Reported on 06/03/2016 12/26/13   Charlett Blake, MD  meclizine (ANTIVERT) 50 MG tablet Take 0.5 tablets (25 mg total) by mouth 3 (three) times daily as needed for dizziness. Patient not taking: Reported on 10/12/2016 08/29/14   Everlene Balls, MD  miconazole (MICOTIN) 2 % cream Apply 1 application topically 2 (two) times daily. Apply to affected area of skin until symptoms have resolved 10/12/16   Sharlett Iles, MD   traMADol (ULTRAM) 50 MG tablet Take 2 tablets (100 mg total) by mouth every 6 (six) hours as needed for moderate pain. 1-2 tablets every 6 hours as needed for pain Patient not taking: Reported on 10/12/2016 06/03/16   Charlesetta Shanks, MD    Family History Family History  Problem Relation Age of Onset  . Cancer Mother     Social History Social History  Substance Use Topics  . Smoking status: Former Smoker    Types: Cigarettes    Quit date: 12/30/1996  . Smokeless tobacco: Never Used  . Alcohol use No     Allergies   Patient has no known allergies.   Review of Systems Review of Systems  All other systems reviewed and are negative.    Physical Exam Updated Vital Signs BP (!) 141/87   Pulse 94   Temp 98.1 F (36.7 C) (Oral)   Resp 19   Ht 5' 10.5" (1.791 m)   Wt 253 lb (114.8 kg)   SpO2 95%   BMI 35.79 kg/m   Physical Exam  Constitutional: He is oriented to person, place, and time. He appears well-developed and well-nourished. No distress.  HENT:  Head: Normocephalic and atraumatic.  Eyes: Conjunctivae are normal.  Neck: Neck supple.  Cardiovascular: Normal rate, regular rhythm and normal heart sounds.   No murmur heard. Pulmonary/Chest: Effort normal and breath sounds normal. No respiratory distress.  Abdominal: Soft. Bowel sounds are normal. He exhibits no distension. There is no tenderness.  Genitourinary:  Genitourinary Comments: uncircumsized penis with easily retractable foreskin; healing wound on glans with surrounding white discharge; mild erythema of glans No scrotal swelling  Neurological: He is alert and oriented to person, place, and time.  Skin: Skin is warm and dry.  Psychiatric: He has a normal mood and affect. Judgment normal.  Nursing note and vitals reviewed.  Chaperone was present during exam.   ED Treatments / Results  Labs (all labs ordered are listed, but only abnormal results are displayed) Labs Reviewed  URINALYSIS, ROUTINE W  REFLEX MICROSCOPIC - Abnormal; Notable for the following:       Result Value   Glucose, UA 100 (*)    Hgb urine dipstick SMALL (*)    Protein, ur 30 (*)    Leukocytes, UA TRACE (*)    All other components within normal limits  URINALYSIS, MICROSCOPIC (REFLEX) - Abnormal; Notable for the following:    Bacteria, UA RARE (*)    Squamous Epithelial / LPF 0-5 (*)    All other components within normal limits  URINE CULTURE    EKG  EKG Interpretation None       Radiology No results found.  Procedures Procedures (including critical care time)  Medications Ordered in ED Medications - No data to display   Initial Impression / Assessment and  Plan / ED Course  I have reviewed the triage vital signs and the nursing notes.  Pertinent labs  that were available during my care of the patient were reviewed by me and considered in my medical decision making (see chart for details).     PT w/ burning on tip of penis after getting penis caught in zipper 1 week ago. Exam c/w balanitis, wound appears to be healing appropriately without any signs/sx of serious infection such as Fournier's. Because of his underlying diabetes, will treat with oral antibiotics as well as topical miconazole. Extensively reviewed return precautions. Patient voiced understanding was discharged in satisfactory condition.  Final Clinical Impressions(s) / ED Diagnoses   Final diagnoses:  Balanitis    New Prescriptions Discharge Medication List as of 10/12/2016 12:51 PM    START taking these medications   Details  cephALEXin (KEFLEX) 500 MG capsule Take 1 capsule (500 mg total) by mouth 2 (two) times daily., Starting Thu 10/12/2016, Print    miconazole (MICOTIN) 2 % cream Apply 1 application topically 2 (two) times daily. Apply to affected area of skin until symptoms have resolved, Starting Thu 10/12/2016, Print         Sharlett Iles, MD 10/12/16 [phone]

## 2016-10-12 NOTE — ED Notes (Signed)
Pt ambulates from waiting room to pod A with no difficulty. Pt getting changed into a gown, then will be hooked up to the monitor.

## 2016-10-12 NOTE — Discharge Instructions (Signed)
RETURN TO ER IF YOU HAVE ANY WORSENING SYMPTOMS.

## 2016-10-12 NOTE — ED Notes (Signed)
Pt is in stable condition upon d/c and ambulates from ed.

## 2016-10-13 LAB — URINE CULTURE
CULTURE: NO GROWTH
Special Requests: NORMAL

## 2017-06-10 ENCOUNTER — Encounter (HOSPITAL_COMMUNITY): Payer: Self-pay | Admitting: Emergency Medicine

## 2017-06-10 ENCOUNTER — Emergency Department (HOSPITAL_COMMUNITY)
Admission: EM | Admit: 2017-06-10 | Discharge: 2017-06-10 | Disposition: A | Discharge status: Home / Self Care | Payer: Medicare PPO | Attending: Emergency Medicine | Admitting: Emergency Medicine

## 2017-06-10 ENCOUNTER — Emergency Department (HOSPITAL_COMMUNITY): Payer: Medicare PPO

## 2017-06-10 DIAGNOSIS — N182 Chronic kidney disease, stage 2 (mild): Secondary | ICD-10-CM | POA: Insufficient documentation

## 2017-06-10 DIAGNOSIS — N390 Urinary tract infection, site not specified: Secondary | ICD-10-CM | POA: Insufficient documentation

## 2017-06-10 DIAGNOSIS — E1122 Type 2 diabetes mellitus with diabetic chronic kidney disease: Secondary | ICD-10-CM | POA: Diagnosis not present

## 2017-06-10 DIAGNOSIS — I129 Hypertensive chronic kidney disease with stage 1 through stage 4 chronic kidney disease, or unspecified chronic kidney disease: Secondary | ICD-10-CM | POA: Insufficient documentation

## 2017-06-10 DIAGNOSIS — R319 Hematuria, unspecified: Secondary | ICD-10-CM | POA: Diagnosis not present

## 2017-06-10 DIAGNOSIS — R1030 Lower abdominal pain, unspecified: Secondary | ICD-10-CM

## 2017-06-10 DIAGNOSIS — Z87891 Personal history of nicotine dependence: Secondary | ICD-10-CM | POA: Insufficient documentation

## 2017-06-10 DIAGNOSIS — Z79899 Other long term (current) drug therapy: Secondary | ICD-10-CM | POA: Insufficient documentation

## 2017-06-10 DIAGNOSIS — R112 Nausea with vomiting, unspecified: Secondary | ICD-10-CM | POA: Diagnosis present

## 2017-06-10 LAB — URINALYSIS, ROUTINE W REFLEX MICROSCOPIC
Bilirubin Urine: NEGATIVE
Glucose, UA: 50 mg/dL — AB
Ketones, ur: 5 mg/dL — AB
Nitrite: NEGATIVE
PROTEIN: 100 mg/dL — AB
SPECIFIC GRAVITY, URINE: 1.02 (ref 1.005–1.030)
pH: 7 (ref 5.0–8.0)

## 2017-06-10 LAB — CBC WITH DIFFERENTIAL/PLATELET
Basophils Absolute: 0 10*3/uL (ref 0.0–0.1)
Basophils Relative: 0 %
EOS PCT: 2 %
Eosinophils Absolute: 0.1 10*3/uL (ref 0.0–0.7)
HCT: 49.3 % (ref 39.0–52.0)
HEMOGLOBIN: 16.9 g/dL (ref 13.0–17.0)
LYMPHS ABS: 2.3 10*3/uL (ref 0.7–4.0)
LYMPHS PCT: 32 %
MCH: 31.6 pg (ref 26.0–34.0)
MCHC: 34.3 g/dL (ref 30.0–36.0)
MCV: 92.1 fL (ref 78.0–100.0)
Monocytes Absolute: 0.4 10*3/uL (ref 0.1–1.0)
Monocytes Relative: 6 %
Neutro Abs: 4.3 10*3/uL (ref 1.7–7.7)
Neutrophils Relative %: 60 %
PLATELETS: 150 10*3/uL (ref 150–400)
RBC: 5.35 MIL/uL (ref 4.22–5.81)
RDW: 14.9 % (ref 11.5–15.5)
WBC: 7.1 10*3/uL (ref 4.0–10.5)

## 2017-06-10 LAB — COMPREHENSIVE METABOLIC PANEL
ALK PHOS: 85 U/L (ref 38–126)
ALT: 14 U/L — AB (ref 17–63)
AST: 20 U/L (ref 15–41)
Albumin: 2.9 g/dL — ABNORMAL LOW (ref 3.5–5.0)
Anion gap: 10 (ref 5–15)
BILIRUBIN TOTAL: 0.3 mg/dL (ref 0.3–1.2)
BUN: 10 mg/dL (ref 6–20)
CALCIUM: 9 mg/dL (ref 8.9–10.3)
CO2: 22 mmol/L (ref 22–32)
CREATININE: 1.11 mg/dL (ref 0.61–1.24)
Chloride: 103 mmol/L (ref 101–111)
GFR, EST NON AFRICAN AMERICAN: 59 mL/min — AB (ref >60)
Glucose, Bld: 179 mg/dL — ABNORMAL HIGH (ref 65–99)
Potassium: 4.1 mmol/L (ref 3.5–5.1)
Sodium: 135 mmol/L (ref 135–145)
Total Protein: 7.1 g/dL (ref 6.5–8.1)

## 2017-06-10 LAB — I-STAT TROPONIN, ED
TROPONIN I, POC: 0 ng/mL (ref 0.00–0.08)
TROPONIN I, POC: 0 ng/mL (ref 0.00–0.08)

## 2017-06-10 LAB — LIPASE, BLOOD: LIPASE: 25 U/L (ref 11–51)

## 2017-06-10 MED ORDER — IOPAMIDOL (ISOVUE-300) INJECTION 61%
INTRAVENOUS | Status: AC
  Administered 2017-06-10: 100 mL
  Filled 2017-06-10: qty 100

## 2017-06-10 MED ORDER — DEXTROSE 5 % IV SOLN
1.0000 g | Freq: Once | INTRAVENOUS | Status: AC
  Administered 2017-06-10: 1 g via INTRAVENOUS
  Filled 2017-06-10: qty 10

## 2017-06-10 MED ORDER — CEPHALEXIN 500 MG PO CAPS: 500.0000 mg | ORAL_CAPSULE | Freq: Four times a day (QID) | ORAL | 0 refills | Status: DC

## 2017-06-10 MED ORDER — ONDANSETRON HCL 4 MG/2ML IJ SOLN
4.0000 mg | Freq: Once | INTRAMUSCULAR | Status: AC
  Administered 2017-06-10: 4 mg via INTRAVENOUS
  Filled 2017-06-10: qty 2

## 2017-06-10 MED ORDER — MORPHINE SULFATE (PF) 4 MG/ML IV SOLN
2.0000 mg | Freq: Once | INTRAVENOUS | Status: AC
  Administered 2017-06-10: 2 mg via INTRAVENOUS
  Filled 2017-06-10: qty 1

## 2017-06-10 MED ORDER — ONDANSETRON 4 MG PO TBDP: 4.0000 mg | ORAL_TABLET | Freq: Three times a day (TID) | ORAL | 0 refills | Status: DC | PRN

## 2017-06-10 NOTE — ED Notes (Signed)
Pt transported to CT ?

## 2017-06-10 NOTE — ED Notes (Signed)
Pt discharged from ED; instructions provided and scripts given; Pt encouraged to return to ED if symptoms worsen and to f/u with PCP; Pt verbalized understanding of all instructions 

## 2017-06-10 NOTE — Discharge Instructions (Signed)
Return to the ED with any concerns including vomiting and not able to keep down liquids or your medications, abdominal pain especially if it localizes to the right lower abdomen, fever or chills, and decreased urine output, decreased level of alertness or lethargy, or any other alarming symptoms.  °

## 2017-06-10 NOTE — ED Notes (Signed)
Pt transported to xray 

## 2017-06-10 NOTE — ED Triage Notes (Signed)
Pt arrives from home by [facility] for n/v and chest pain for the last 2 weeks. Pt states he has a poor appetite for the last 2 days.

## 2017-06-10 NOTE — ED Notes (Signed)
Pt given ice water- tolerating well 

## 2017-06-10 NOTE — ED Provider Notes (Signed)
[facility] Provider Note   CSN: [id number] Arrival date & time: 06/10/17  1144     History   Chief Complaint Chief Complaint  Patient presents with  . Chest Pain    HPI [name] is a 83 y.o. male.  HPI  Patient with history of diabetes hypertension hiatal hernia presenting with complaint of vomiting and abdominal pain and some associated chest pain.  He states the nausea and vomiting began approximately 3 days to 1 week ago.  He states he is vomiting up greenish bile.  He states he cannot keep down p.o. fluids or food.  He describes right-sided abdominal pain.  He denies any dysuria.  He has not had any diarrhea.  He states the chest pain is worse when he has vomiting.  The chest pain has been constant over the past 2 days.  He has no shortness of breath.  He has had a subjective fever.  No sick contacts. There are no other associated systemic symptoms, there are no other alleviating or modifying factors.  No recent travel.    Past Medical History:  Diagnosis Date  . Arthritis   . Diabetes mellitus   . H/O hiatal hernia   . Hyperlipidemia   . Hypertension   . Neuropathy   . Obesity     Patient Active Problem List   Diagnosis Date Noted  . Painful diabetic neuropathy ([place]) 12/26/2013  . Chest pain at rest 08/29/2013  . Hyperlipidemia 01/01/2012  . Hyponatremia 12/31/2011  . CKD (chronic kidney disease), stage II 12/31/2011  . Dehydration 12/31/2011  . Dizziness 12/31/2011  . Polycythemia 12/31/2011  . Pulmonary nodule 12/31/2011  . Obesity 12/31/2011  . DM (diabetes mellitus), type 2, uncontrolled with complications ([place]) 74/06/8785  . HTN (hypertension), benign 07/10/2011  . Chest pain 07/09/2011  . Shortness of breath 07/09/2011    Past Surgical History:  Procedure Laterality Date  . ABDOMINAL SURGERY     pt has abdominal scarring and states he had sx but not sure what kind  . BACK SURGERY    . CHOLECYSTECTOMY           Home Medications    Prior to Admission medications   Medication Sig Start Date End Date Taking? Authorizing Provider  acetaminophen (TYLENOL) 500 MG tablet Take 2 tablets (1,000 mg total) by mouth every 6 (six) hours as needed. Patient taking differently: Take 1,000 mg by mouth every 6 (six) hours as needed for mild pain.  06/03/16  Yes Pfeiffer, Jeannie Done, MD  Aspirin-Salicylamide-Caffeine (BC HEADACHE POWDER PO) Take 1 packet by mouth daily as needed (Headache).   Yes [provider]  cyanocobalamin (,VITAMIN B-12,) 1000 MCG/ML injection Inject 1,000 mcg into the muscle every 30 (thirty) days. 10/03/16  Yes [provider]  cephALEXin (KEFLEX) 500 MG capsule Take 1 capsule (500 mg total) by mouth 4 (four) times daily. 06/10/17   Mabe, Forbes Cellar, MD  gabapentin (NEURONTIN) 400 MG capsule Take 1 capsule (400 mg total) by mouth 3 (three) times daily. Patient not taking: Reported on 06/03/2016 12/26/13   Charlett Blake, MD  HYDROcodone-acetaminophen [facility]) 10-325 MG per tablet Take 1 tablet by mouth every 6 (six) hours as needed. Patient taking differently: Take 1 tablet by mouth 4 (four) times daily.  08/11/14   Drenda Freeze, MD  losartan (COZAAR) 50 MG tablet Take 50 mg by mouth daily. 05/05/15   [provider]  meclizine (ANTIVERT) 50 MG tablet Take 0.5  tablets (25 mg total) by mouth 3 (three) times daily as needed for dizziness. Patient not taking: Reported on 10/12/2016 08/29/14   Everlene Balls, MD  miconazole (MICOTIN) 2 % cream Apply 1 application topically 2 (two) times daily. Apply to affected area of skin until symptoms have resolved 10/12/16   Little, Wenda Overland, MD  nabumetone (RELAFEN) 500 MG tablet Take 500 mg by mouth 2 (two) times daily. 06/02/16   [provider]  nitroGLYCERIN (NITROSTAT) 0.4 MG SL tablet Place 1 tablet (0.4 mg total) under the tongue every 5 (five) minutes x 3 doses as needed for chest pain. 08/31/13   Dixie Dials, MD   ondansetron (ZOFRAN ODT) 4 MG disintegrating tablet Take 1 tablet (4 mg total) by mouth every 8 (eight) hours as needed. 06/10/17   Mabe, Forbes Cellar, MD  PROAIR HFA 108 (90 BASE) MCG/ACT inhaler Inhale 1 puff into the lungs every 4 (four) hours as needed for wheezing or shortness of breath.  02/11/14   [provider]  tamsulosin (FLOMAX) 0.4 MG CAPS capsule Take 0.4 mg by mouth daily. 07/05/16   [provider]  traMADol (ULTRAM) 50 MG tablet Take 1 tablet (50 mg total) by mouth every 6 (six) hours as needed. 05/09/15   Lajean Saver, MD  traMADol (ULTRAM) 50 MG tablet Take 2 tablets (100 mg total) by mouth every 6 (six) hours as needed for moderate pain. 1-2 tablets every 6 hours as needed for pain Patient not taking: Reported on 10/12/2016 06/03/16   Charlesetta Shanks, MD    Family History Family History  Problem Relation Age of Onset  . Cancer Mother     Social History Social History   Tobacco Use  . Smoking status: Former Smoker    Types: Cigarettes    Last attempt to quit: 12/30/1996    Years since quitting: 20.4  . Smokeless tobacco: Never Used  Substance Use Topics  . Alcohol use: No  . Drug use: No     Allergies   Patient has no known allergies.   Review of Systems Review of Systems  ROS reviewed and all otherwise negative except for mentioned in HPI   Physical Exam Updated Vital Signs BP (!) 146/98   Pulse 90   Temp 98.8 F (37.1 C) (Oral)   Resp 19   Ht 5' 10.5" (1.791 m)   Wt 117.9 kg (260 lb)   SpO2 96%   BMI 36.78 kg/m  Vitals reviewed Physical Exam  Physical Examination: General appearance - alert, well appearing, and in no distress Mental status - alert, oriented to person, place, and time Eyes - no conjunctival injection, no scleral icterus Mouth - mucous membranes moist, pharynx normal without lesions Neck - supple, no significant adenopathy Chest - clear to auscultation, no wheezes, rales or rhonchi, symmetric air entry Heart -  normal rate, regular rhythm, normal S1, S2, no murmurs, rubs, clicks or gallops Abdomen - soft, ttp in right lower abdomen, no gaurding or rebound tenderness, nabs, nondistended, no masses or organomegaly Neurological - alert, oriented, normal speech, no focal findings or movement disorder noted Extremities - peripheral pulses normal, no pedal edema, no clubbing or cyanosis Skin - normal coloration and turgor, no rashes, no suspicious skin lesions noted   ED Treatments / Results  Labs (all labs ordered are listed, but only abnormal results are displayed) Labs Reviewed  COMPREHENSIVE METABOLIC PANEL - Abnormal; Notable for the following components:      Result Value   Glucose, Bld  179 (*)    Albumin 2.9 (*)    ALT 14 (*)    GFR calc non Af Amer 59 (*)    All other components within normal limits  URINALYSIS, ROUTINE W REFLEX MICROSCOPIC - Abnormal; Notable for the following components:   Glucose, UA 50 (*)    Hgb urine dipstick LARGE (*)    Ketones, ur 5 (*)    Protein, ur 100 (*)    Leukocytes, UA SMALL (*)    Bacteria, UA RARE (*)    Squamous Epithelial / LPF 0-5 (*)    All other components within normal limits  URINE CULTURE  CBC WITH DIFFERENTIAL/PLATELET  LIPASE, BLOOD  I-STAT TROPONIN, ED  I-STAT TROPONIN, ED    EKG  EKG Interpretation  Date/Time:  Sunday June 10 2017 11:50:47 EST Ventricular Rate:  96 PR Interval:    QRS Duration: 98 QT Interval:  347 QTC Calculation: 439 R Axis:   64 Text Interpretation:  Sinus rhythm Ventricular premature complex Prolonged PR interval Borderline T abnormalities, inferior leads Borderline ST elevation, anterior leads Since previous tracing PVC is new Confirmed by Alfonzo Beers [phone]) on 06/10/2017 11:53:51 AM       Radiology Ct Abdomen Pelvis W Contrast  Result Date: 06/10/2017 CLINICAL DATA:  Right upper quadrant abdominal pain and chest pain for 2 weeks. EXAM: CT ABDOMEN AND PELVIS WITH CONTRAST TECHNIQUE:  Multidetector CT imaging of the abdomen and pelvis was performed using the standard protocol following bolus administration of intravenous contrast. CONTRAST:  122mL ISOVUE-300 IOPAMIDOL (ISOVUE-300) INJECTION 61% COMPARISON:  None. FINDINGS: Lower chest: Peripheral fibrotic changes are again noted. No significant superimposed disease is present. Heart size is normal. Hepatobiliary: Multiple hepatic cysts are stable. The common bile duct is within normal limits following cholecystectomy. Pancreas: Unremarkable. No pancreatic ductal dilatation or surrounding inflammatory changes. Spleen: Normal in size without focal abnormality. Adrenals/Urinary Tract: The adrenal glands are normal bilaterally. Bilateral renal cysts are stable. No new mass lesion is present. There is no stone or hydronephrosis. Ureters are within normal limits bilaterally. The urinary bladder is unremarkable. Stomach/Bowel: The stomach and duodenum are within normal limits. Small bowel is unremarkable. Terminal ileum is within normal limits. The appendix is visualized and normal. The ascending and transverse colon are within normal limits. The descending and sigmoid colon are unremarkable. 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 No significant retroperitoneal or pelvic adenopathy is present. Reproductive: Prostate is unremarkable. Other: No abdominal wall hernia or abnormality. No abdominopelvic ascites. Musculoskeletal: Vertebral body heights and alignment are normal. Advanced facet degenerative changes contribute to foraminal narrowing at L4-5 and L5-S1 bilaterally. The bony pelvis is intact. Mild degenerative changes are noted at the SI joints. The hips are located and within normal limits bilaterally. IMPRESSION: 1. No acute or focal lesion to explain the patient's symptoms. 2. Stable findings of pulmonary  fibrosis. 3. Stable borderline cardiomegaly. 4. 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 5. Advanced degenerative changes in the lower lumbar spine. Electronically Signed   By: San Morelle M.D.   On: 06/10/2017 15:58   Dg Abd Acute W/chest  Result Date: 06/10/2017 CLINICAL DATA:  RIGHT-side lower abdominal pain, minor chest pain when sitting up, vomiting, history hypertension, diabetes mellitus EXAM: DG ABDOMEN ACUTE W/ 1V CHEST COMPARISON:  Chest radiograph 06/03/2016 FINDINGS: Mild enlargement of cardiac silhouette  with pulmonary vascular congestion. Tortuous aorta. Chronic interstitial prominence with peripheral predominance unchanged likely chronic interstitial lung disease. No superimposed acute infiltrate, pleural effusion or pneumothorax. Normal bowel gas pattern. Scattered stool and gas throughout colon. No bowel dilatation, bowel wall thickening, or free air. Bones demineralized. IMPRESSION: Chronic interstitial lung disease. No acute abnormalities. Electronically Signed   By: Lavonia Dana M.D.   On: 06/10/2017 12:58    Procedures Procedures (including critical care time)  Medications Ordered in ED Medications  cefTRIAXone (ROCEPHIN) 1 g in dextrose 5 % 50 mL IVPB (not administered)  ondansetron (ZOFRAN) injection 4 mg (4 mg Intravenous Given 06/10/17 1334)  iopamidol (ISOVUE-300) 61 % injection (100 mLs  Contrast Given 06/10/17 1533)  morphine 4 MG/ML injection 2 mg (2 mg Intravenous Given 06/10/17 1607)     Initial Impression / Assessment and Plan / ED Course  I have reviewed the triage vital signs and the nursing notes.  Pertinent labs & imaging results that were available during my care of the patient were reviewed by me and considered in my medical decision making (see chart for details).    4:11 PM pt states he feels much better after zofran, he no longer feels nauseated, has some ongoing right lower  abdominal pain and chronic back pain due to lying on stretcher.  Discussed all results with him, plan for second troponin (chest pain was constant for several days prior to resolving in the ED).  Pt has some RBCs and WBCs in his urine- pt does state he has some dysuria- will start on abx.  Doubt pyelo, no leukocytosis, no fever, vitals reassuring.  After 2nd troponin, tolerating po trial and first dose of abx plan for discharge home with rx for zofran and keflex.      Final Clinical Impressions(s) / ED Diagnoses   Final diagnoses:  Urinary tract infection with hematuria, site unspecified  Lower abdominal pain    ED Discharge Orders        Ordered    cephALEXin (KEFLEX) 500 MG capsule  4 times daily     06/10/17 1614    ondansetron (ZOFRAN ODT) 4 MG disintegrating tablet  Every 8 hours PRN     06/10/17 1614       Mabe, Forbes Cellar, MD 06/10/17 1624

## 2017-06-10 NOTE — ED Notes (Signed)
Pt denies chest pain; states that he is only having HA at this time.

## 2017-06-11 LAB — URINE CULTURE

## 2017-07-27 ENCOUNTER — Inpatient Hospital Stay (HOSPITAL_COMMUNITY)
Admission: EM | Admit: 2017-07-27 | Discharge: 2017-08-01 | DRG: 313 | Disposition: A | Discharge status: Home Health | Payer: Medicare HMO | Attending: Internal Medicine | Admitting: Internal Medicine

## 2017-07-27 ENCOUNTER — Emergency Department (HOSPITAL_COMMUNITY): Payer: Medicare HMO

## 2017-07-27 ENCOUNTER — Other Ambulatory Visit: Payer: Self-pay

## 2017-07-27 ENCOUNTER — Encounter (HOSPITAL_COMMUNITY): Payer: Self-pay

## 2017-07-27 DIAGNOSIS — Z7952 Long term (current) use of systemic steroids: Secondary | ICD-10-CM

## 2017-07-27 DIAGNOSIS — R072 Precordial pain: Secondary | ICD-10-CM

## 2017-07-27 DIAGNOSIS — I5032 Chronic diastolic (congestive) heart failure: Secondary | ICD-10-CM | POA: Diagnosis present

## 2017-07-27 DIAGNOSIS — E785 Hyperlipidemia, unspecified: Secondary | ICD-10-CM | POA: Diagnosis present

## 2017-07-27 DIAGNOSIS — J9621 Acute and chronic respiratory failure with hypoxia: Secondary | ICD-10-CM | POA: Diagnosis present

## 2017-07-27 DIAGNOSIS — E118 Type 2 diabetes mellitus with unspecified complications: Secondary | ICD-10-CM

## 2017-07-27 DIAGNOSIS — R0789 Other chest pain: Principal | ICD-10-CM | POA: Diagnosis present

## 2017-07-27 DIAGNOSIS — E1140 Type 2 diabetes mellitus with diabetic neuropathy, unspecified: Secondary | ICD-10-CM | POA: Diagnosis present

## 2017-07-27 DIAGNOSIS — Z79899 Other long term (current) drug therapy: Secondary | ICD-10-CM

## 2017-07-27 DIAGNOSIS — E1165 Type 2 diabetes mellitus with hyperglycemia: Secondary | ICD-10-CM | POA: Diagnosis present

## 2017-07-27 DIAGNOSIS — I440 Atrioventricular block, first degree: Secondary | ICD-10-CM | POA: Diagnosis present

## 2017-07-27 DIAGNOSIS — R9431 Abnormal electrocardiogram [ECG] [EKG]: Secondary | ICD-10-CM

## 2017-07-27 DIAGNOSIS — J849 Interstitial pulmonary disease, unspecified: Secondary | ICD-10-CM | POA: Diagnosis present

## 2017-07-27 DIAGNOSIS — Z6836 Body mass index (BMI) 36.0-36.9, adult: Secondary | ICD-10-CM

## 2017-07-27 DIAGNOSIS — K224 Dyskinesia of esophagus: Secondary | ICD-10-CM | POA: Diagnosis present

## 2017-07-27 DIAGNOSIS — R0602 Shortness of breath: Secondary | ICD-10-CM | POA: Diagnosis not present

## 2017-07-27 DIAGNOSIS — R079 Chest pain, unspecified: Secondary | ICD-10-CM | POA: Diagnosis present

## 2017-07-27 DIAGNOSIS — M1990 Unspecified osteoarthritis, unspecified site: Secondary | ICD-10-CM | POA: Diagnosis present

## 2017-07-27 DIAGNOSIS — R011 Cardiac murmur, unspecified: Secondary | ICD-10-CM | POA: Diagnosis present

## 2017-07-27 DIAGNOSIS — N182 Chronic kidney disease, stage 2 (mild): Secondary | ICD-10-CM | POA: Diagnosis present

## 2017-07-27 DIAGNOSIS — K219 Gastro-esophageal reflux disease without esophagitis: Secondary | ICD-10-CM | POA: Diagnosis present

## 2017-07-27 DIAGNOSIS — IMO0002 Reserved for concepts with insufficient information to code with codable children: Secondary | ICD-10-CM | POA: Diagnosis present

## 2017-07-27 DIAGNOSIS — E1122 Type 2 diabetes mellitus with diabetic chronic kidney disease: Secondary | ICD-10-CM | POA: Diagnosis present

## 2017-07-27 DIAGNOSIS — I130 Hypertensive heart and chronic kidney disease with heart failure and stage 1 through stage 4 chronic kidney disease, or unspecified chronic kidney disease: Secondary | ICD-10-CM | POA: Diagnosis present

## 2017-07-27 DIAGNOSIS — B379 Candidiasis, unspecified: Secondary | ICD-10-CM | POA: Diagnosis present

## 2017-07-27 DIAGNOSIS — Z87891 Personal history of nicotine dependence: Secondary | ICD-10-CM

## 2017-07-27 DIAGNOSIS — Z79891 Long term (current) use of opiate analgesic: Secondary | ICD-10-CM

## 2017-07-27 DIAGNOSIS — Z9049 Acquired absence of other specified parts of digestive tract: Secondary | ICD-10-CM

## 2017-07-27 DIAGNOSIS — E669 Obesity, unspecified: Secondary | ICD-10-CM | POA: Diagnosis present

## 2017-07-27 DIAGNOSIS — I10 Essential (primary) hypertension: Secondary | ICD-10-CM | POA: Diagnosis present

## 2017-07-27 LAB — BRAIN NATRIURETIC PEPTIDE: B Natriuretic Peptide: 11.8 pg/mL (ref 0.0–100.0)

## 2017-07-27 LAB — CBC
HEMATOCRIT: 48.2 % (ref 39.0–52.0)
HEMOGLOBIN: 16.2 g/dL (ref 13.0–17.0)
MCH: 31.5 pg (ref 26.0–34.0)
MCHC: 33.6 g/dL (ref 30.0–36.0)
MCV: 93.6 fL (ref 78.0–100.0)
Platelets: 185 10*3/uL (ref 150–400)
RBC: 5.15 MIL/uL (ref 4.22–5.81)
RDW: 14.5 % (ref 11.5–15.5)
WBC: 7.9 10*3/uL (ref 4.0–10.5)

## 2017-07-27 LAB — I-STAT TROPONIN, ED
Troponin i, poc: 0 ng/mL (ref 0.00–0.08)
Troponin i, poc: 0.04 ng/mL (ref 0.00–0.08)

## 2017-07-27 LAB — BASIC METABOLIC PANEL
Anion gap: 11 (ref 5–15)
BUN: 10 mg/dL (ref 6–20)
CO2: 23 mmol/L (ref 22–32)
Calcium: 9 mg/dL (ref 8.9–10.3)
Chloride: 103 mmol/L (ref 101–111)
Creatinine, Ser: 1.16 mg/dL (ref 0.61–1.24)
GFR calc Af Amer: >60 mL/min (ref >60)
GFR calc non Af Amer: 56 mL/min — ABNORMAL LOW (ref >60)
Glucose, Bld: 167 mg/dL — ABNORMAL HIGH (ref 65–99)
Potassium: 4.7 mmol/L (ref 3.5–5.1)
Sodium: 137 mmol/L (ref 135–145)

## 2017-07-27 LAB — D-DIMER, QUANTITATIVE: D-Dimer, Quant: 1.35 ug/mL-FEU — ABNORMAL HIGH (ref 0.00–0.50)

## 2017-07-27 MED ORDER — MORPHINE SULFATE (PF) 4 MG/ML IV SOLN
4.0000 mg | Freq: Once | INTRAVENOUS | Status: AC
  Administered 2017-07-27: 4 mg via INTRAVENOUS
  Filled 2017-07-27 (×2): qty 1

## 2017-07-27 MED ORDER — ONDANSETRON HCL 4 MG/2ML IJ SOLN
4.0000 mg | Freq: Once | INTRAMUSCULAR | Status: AC
  Administered 2017-07-27: 4 mg via INTRAVENOUS
  Filled 2017-07-27 (×2): qty 2

## 2017-07-27 MED ORDER — FUROSEMIDE 10 MG/ML IJ SOLN
20.0000 mg | Freq: Once | INTRAMUSCULAR | Status: DC
  Filled 2017-07-27: qty 2

## 2017-07-27 NOTE — ED Notes (Signed)
IV team at bedside 

## 2017-07-27 NOTE — ED Notes (Signed)
IV team unable to start PIV at this time; will send another RN to attempt.

## 2017-07-27 NOTE — ED Notes (Signed)
Attempted PIV x 2; no success. Collected partial lab draw. IV team consult placed.

## 2017-07-27 NOTE — ED Notes (Signed)
Per Lab; d-dimer clotted. IV team at bedside still attempting PIV, states they will try and recollect blood sample.

## 2017-07-27 NOTE — ED Notes (Signed)
Nurse collecting labs. 

## 2017-07-27 NOTE — ED Notes (Signed)
Second IV team nurse at bedside.  

## 2017-07-27 NOTE — ED Provider Notes (Signed)
[place] EMERGENCY DEPARTMENT Provider Note   CSN: [id number] Arrival date & time: 07/27/17  1438     History   Chief Complaint Chief Complaint  Patient presents with  . Chest Pain    HPI [name] is a 84 y.o. male.  Patient with history of diabetes, hyperlipidemia, hypertension, grade 1 diastolic heart failure with an EF of 60-65% on echo in 2015, remote nuclear stress test --presents to the emergency department today with ongoing mid chest pain for the past 1 day.  It is described as an ache.  It does not radiate.  Patient has associated shortness of breath that has been increasing.  Patient states that he has difficulty walking across the room now because of shortness of breath.  He has mild lower extremity swelling.  He reports having difficulty sleeping and is much more comfortable sitting up in a recliner.  Patient denies risk factors for pulmonary embolism including: unilateral leg swelling, history of DVT/PE/other blood clots, use of exogenous hormones, recent immobilizations, recent surgery, recent travel (>4hr segment), malignancy, hemoptysis.  No fevers or cough.  Patient was sent to the emergency department after complaining about shortness of breath while at his urologist office today.  He states that they told him that he had fluid in his lungs.  The onset of this condition was acute chronic. The course is gradually worsening. Aggravating factors: Activity. Alleviating factors: none.         Past Medical History:  Diagnosis Date  . Arthritis   . Diabetes mellitus   . H/O hiatal hernia   . Hyperlipidemia   . Hypertension   . Neuropathy   . Obesity     Patient Active Problem List   Diagnosis Date Noted  . Painful diabetic neuropathy ([place]) 12/26/2013  . Chest pain at rest 08/29/2013  . Hyperlipidemia 01/01/2012  . Hyponatremia 12/31/2011  . CKD (chronic kidney disease), stage II 12/31/2011  . Dehydration 12/31/2011  . Dizziness  12/31/2011  . Polycythemia 12/31/2011  . Pulmonary nodule 12/31/2011  . Obesity 12/31/2011  . DM (diabetes mellitus), type 2, uncontrolled with complications ([place]) 40/34/7425  . HTN (hypertension), benign 07/10/2011  . Chest pain 07/09/2011  . Shortness of breath 07/09/2011    Past Surgical History:  Procedure Laterality Date  . ABDOMINAL SURGERY     pt has abdominal scarring and states he had sx but not sure what kind  . BACK SURGERY    . CHOLECYSTECTOMY         Home Medications    Prior to Admission medications   Medication Sig Start Date End Date Taking? Authorizing Provider  acetaminophen (TYLENOL) 500 MG tablet Take 2 tablets (1,000 mg total) by mouth every 6 (six) hours as needed. Patient taking differently: Take 1,000 mg by mouth every 6 (six) hours as needed for mild pain.  06/03/16  Yes Pfeiffer, Jeannie Done, MD  Aspirin-Salicylamide-Caffeine (BC HEADACHE POWDER PO) Take 1 packet by mouth daily as needed (Headache).   Yes [provider]  betamethasone valerate ointment (VALISONE) 0.1 % Apply 1 application topically 2 (two) times daily.   Yes [provider]  furosemide (LASIX) 40 MG tablet Take 40 mg by mouth daily.    Yes [provider]  HYDROcodone-acetaminophen (NORCO) 10-325 MG per tablet Take 1 tablet by mouth every 6 (six) hours as needed. Patient taking differently: Take 1 tablet by mouth 4 (four) times daily.  08/11/14  Yes Drenda Freeze, MD  losartan (COZAAR) 50  MG tablet Take 50 mg by mouth daily. 05/05/15  Yes [provider]  nitroGLYCERIN (NITROSTAT) 0.4 MG SL tablet Place 1 tablet (0.4 mg total) under the tongue every 5 (five) minutes x 3 doses as needed for chest pain. 08/31/13  Yes Dixie Dials, MD  PROAIR HFA 108 (90 BASE) MCG/ACT inhaler Inhale 1 puff into the lungs every 4 (four) hours as needed for wheezing or shortness of breath.  02/11/14  Yes [provider]  cyanocobalamin (,VITAMIN B-12,) 1000 MCG/ML  injection Inject 1,000 mcg into the muscle every 30 (thirty) days. 10/03/16   [provider]  gabapentin (NEURONTIN) 400 MG capsule Take 1 capsule (400 mg total) by mouth 3 (three) times daily. Patient not taking: Reported on 07/27/2017 12/26/13   Charlett Blake, MD  meclizine (ANTIVERT) 50 MG tablet Take 0.5 tablets (25 mg total) by mouth 3 (three) times daily as needed for dizziness. Patient not taking: Reported on 10/12/2016 08/29/14   Everlene Balls, MD  miconazole (MICOTIN) 2 % cream Apply 1 application topically 2 (two) times daily. Apply to affected area of skin until symptoms have resolved Patient not taking: Reported on 07/27/2017 10/12/16   Little, Wenda Overland, MD  ondansetron (ZOFRAN ODT) 4 MG disintegrating tablet Take 1 tablet (4 mg total) by mouth every 8 (eight) hours as needed. Patient not taking: Reported on 07/27/2017 06/10/17   Pixie Casino, MD  traMADol (ULTRAM) 50 MG tablet Take 1 tablet (50 mg total) by mouth every 6 (six) hours as needed. Patient not taking: Reported on 07/27/2017 05/09/15   Lajean Saver, MD  traMADol (ULTRAM) 50 MG tablet Take 2 tablets (100 mg total) by mouth every 6 (six) hours as needed for moderate pain. 1-2 tablets every 6 hours as needed for pain Patient not taking: Reported on 07/27/2017 06/03/16   Charlesetta Shanks, MD  hydrALAZINE (APRESOLINE) 25 MG tablet Take 1 tablet (25 mg total) by mouth 3 (three) times daily. 10/26/12 10/26/12  Teressa Lower, MD    Family History Family History  Problem Relation Age of Onset  . Cancer Mother     Social History Social History   Tobacco Use  . Smoking status: Former Smoker    Types: Cigarettes    Last attempt to quit: 12/30/1996    Years since quitting: 20.5  . Smokeless tobacco: Never Used  Substance Use Topics  . Alcohol use: No  . Drug use: No     Allergies   Patient has no known allergies.   Review of Systems Review of Systems  Constitutional: Negative for diaphoresis and fever.    Eyes: Negative for redness.  Respiratory: Positive for shortness of breath. Negative for cough and wheezing.   Cardiovascular: Positive for chest pain and leg swelling. Negative for palpitations.  Gastrointestinal: Negative for abdominal pain, nausea and vomiting.  Genitourinary: Negative for dysuria.  Musculoskeletal: Negative for back pain and neck pain.  Skin: Negative for rash.  Neurological: Negative for syncope and light-headedness.  Psychiatric/Behavioral: Positive for sleep disturbance. The patient is not nervous/anxious.      Physical Exam Updated Vital Signs BP 115/90   Pulse 91   Temp 98 F (36.7 C) (Oral)   Resp (!) 23   Ht 5\' 10"  (1.778 m)   Wt 115.7 kg (255 lb)   SpO2 94%   BMI 36.59 kg/m   Physical Exam  Constitutional: He appears well-developed and well-nourished.  HENT:  Head: Normocephalic and atraumatic.  Mouth/Throat: Mucous membranes are normal. Mucous membranes  are not dry.  Eyes: Conjunctivae are normal.  Neck: Trachea normal and normal range of motion. Neck supple. Normal carotid pulses and no JVD present. No muscular tenderness present. Carotid bruit is not present. No tracheal deviation present.  Cardiovascular: Normal rate, regular rhythm, S1 normal, S2 normal, normal heart sounds, intact distal pulses and normal pulses. Exam reveals no distant heart sounds and no decreased pulses.  No murmur heard. Pulmonary/Chest: Effort normal. No respiratory distress. He has no decreased breath sounds. He has no wheezes. He has no rhonchi. He has rales in the right middle field, the right lower field, the left middle field and the left lower field. He exhibits no tenderness.  Abdominal: Soft. Normal aorta and bowel sounds are normal. There is no tenderness. There is no rebound and no guarding.  Musculoskeletal:       Right lower leg: He exhibits edema. He exhibits no tenderness.       Left lower leg: He exhibits edema. He exhibits no tenderness.  Symmetric 1+  pitting edema to the knees bilaterally.  No redness or erythema.  No clinical signs of DVT.  Neurological: He is alert.  Skin: Skin is warm and dry. He is not diaphoretic. No cyanosis. No pallor.  Psychiatric: He has a normal mood and affect.  Nursing note and vitals reviewed.    ED Treatments / Results  Labs (all labs ordered are listed, but only abnormal results are displayed) Labs Reviewed  BASIC METABOLIC PANEL - Abnormal; Notable for the following components:      Result Value   Glucose, Bld 167 (*)    GFR calc non Af Amer 56 (*)    All other components within normal limits  D-DIMER, QUANTITATIVE (NOT AT [facility]) - Abnormal; Notable for the following components:   D-Dimer, Quant 1.35 (*)    All other components within normal limits  CBC  BRAIN NATRIURETIC PEPTIDE  I-STAT TROPONIN, ED  I-STAT TROPONIN, ED  I-STAT TROPONIN, ED    EKG  EKG Interpretation  Date/Time:  Friday July 27 2017 20:38:14 EST Ventricular Rate:  87 PR Interval:    QRS Duration: 95 QT Interval:  371 QTC Calculation: 447 R Axis:   93 Text Interpretation:  Sinus rhythm Prolonged PR interval Borderline T abnormalities, inferior leads Non-specific ST-t changes Confirmed by Virgel Manifold [phone]) on 07/27/2017 8:45:19 PM       Radiology Dg Chest 2 View  Result Date: 07/27/2017 CLINICAL DATA:  Chest pain and shortness of breath for 2 weeks. EXAM: CHEST  2 VIEW COMPARISON:  06/10/2017 and prior radiographs FINDINGS: Cardiomegaly and pulmonary vascular congestion noted. Chronic interstitial opacities are again noted. No definite airspace disease noted. There is no evidence of pulmonary edema, suspicious pulmonary nodule/mass, pleural effusion, or pneumothorax. No acute bony abnormalities are identified. IMPRESSION: 1. No definite acute abnormality. 2. Cardiomegaly and chronic interstitial opacities. Electronically Signed   By: Margarette Canada M.D.   On: 07/27/2017 16:03    Procedures Procedures (including  critical care time)  Medications Ordered in ED Medications  morphine 4 MG/ML injection 4 mg (4 mg Intravenous Given 07/27/17 2327)  ondansetron (ZOFRAN) injection 4 mg (4 mg Intravenous Given 07/27/17 2325)     Initial Impression / Assessment and Plan / ED Course  I have reviewed the triage vital signs and the nursing notes.  Pertinent labs & imaging results that were available during my care of the patient were reviewed by me and considered in my medical decision making (see chart  for details).     Patient seen and examined. Work-up initiated. Medications ordered.  Clinical exam and history is indicative of worsening heart failure.  D-dimer sent given chest pain with associated worsening shortness of breath.  Pending BNP.  Will likely need admission for further workup.  Discussed with Dr. Wilson Singer.   Vital signs reviewed and are as follows: BP (!) 142/94   Pulse 83   Temp 98 F (36.7 C) (Oral)   Resp 18   Ht 5\' 10"  (1.778 m)   Wt 115.7 kg (255 lb)   SpO2 95%   BMI 36.59 kg/m    12:08 AM D-dimer had clotted initially. IV team spent extensive amount of time obtaining IV, has 24g in right hand. Unable to get CT chest 2/2 d-dimer elevation -- will need VQ.   Pt stable, continues to have some pain after morphine. He is hungry.   Trop 0.00 --> 0.04 --> 0.00.   Will ask hospitalist for admit. Sees Selina Cooley FNP at [facility].   12:57 AM Spoke with Dr. Alcario Drought who will see.    Final Clinical Impressions(s) / ED Diagnoses   Final diagnoses:  Precordial pain  Shortness of breath   Admit.   ED Discharge Orders    None       Carlisle Cater, Hershal Coria 07/28/17 1017    Virgel Manifold, MD 07/29/17 [phone]

## 2017-07-27 NOTE — ED Triage Notes (Signed)
Pt sent by urologist due to central CP that began this morning while the pt was laying in bed. No radiation but pt endorses shob. Pt has hx of htn, dm and high cholesterol.

## 2017-07-28 ENCOUNTER — Observation Stay (HOSPITAL_BASED_OUTPATIENT_CLINIC_OR_DEPARTMENT_OTHER): Payer: Medicare HMO

## 2017-07-28 ENCOUNTER — Observation Stay (HOSPITAL_COMMUNITY): Payer: Medicare HMO

## 2017-07-28 ENCOUNTER — Encounter (HOSPITAL_COMMUNITY): Payer: Self-pay | Admitting: *Deleted

## 2017-07-28 DIAGNOSIS — R079 Chest pain, unspecified: Secondary | ICD-10-CM

## 2017-07-28 DIAGNOSIS — E1165 Type 2 diabetes mellitus with hyperglycemia: Secondary | ICD-10-CM | POA: Diagnosis not present

## 2017-07-28 DIAGNOSIS — E118 Type 2 diabetes mellitus with unspecified complications: Secondary | ICD-10-CM

## 2017-07-28 DIAGNOSIS — I10 Essential (primary) hypertension: Secondary | ICD-10-CM

## 2017-07-28 DIAGNOSIS — M7989 Other specified soft tissue disorders: Secondary | ICD-10-CM

## 2017-07-28 DIAGNOSIS — R9431 Abnormal electrocardiogram [ECG] [EKG]: Secondary | ICD-10-CM | POA: Diagnosis not present

## 2017-07-28 DIAGNOSIS — M79609 Pain in unspecified limb: Secondary | ICD-10-CM

## 2017-07-28 DIAGNOSIS — J849 Interstitial pulmonary disease, unspecified: Secondary | ICD-10-CM | POA: Diagnosis not present

## 2017-07-28 DIAGNOSIS — R072 Precordial pain: Secondary | ICD-10-CM | POA: Diagnosis not present

## 2017-07-28 DIAGNOSIS — R0602 Shortness of breath: Secondary | ICD-10-CM

## 2017-07-28 LAB — CBC
HCT: 48.7 % (ref 39.0–52.0)
HEMOGLOBIN: 16.3 g/dL (ref 13.0–17.0)
MCH: 31.4 pg (ref 26.0–34.0)
MCHC: 33.5 g/dL (ref 30.0–36.0)
MCV: 93.8 fL (ref 78.0–100.0)
Platelets: 179 10*3/uL (ref 150–400)
RBC: 5.19 MIL/uL (ref 4.22–5.81)
RDW: 14.6 % (ref 11.5–15.5)
WBC: 8.2 10*3/uL (ref 4.0–10.5)

## 2017-07-28 LAB — CBG MONITORING, ED
GLUCOSE-CAPILLARY: 254 mg/dL — AB (ref 65–99)
GLUCOSE-CAPILLARY: 178 mg/dL — AB (ref 65–99)
Glucose-Capillary: 225 mg/dL — ABNORMAL HIGH (ref 65–99)

## 2017-07-28 LAB — C-REACTIVE PROTEIN: CRP: 1.4 mg/dL — AB (ref <1.0)

## 2017-07-28 LAB — TROPONIN I
Troponin I: <0.03 ng/mL (ref <0.03)
Troponin I: <0.03 ng/mL (ref <0.03)

## 2017-07-28 LAB — I-STAT TROPONIN, ED: TROPONIN I, POC: 0 ng/mL (ref 0.00–0.08)

## 2017-07-28 LAB — SEDIMENTATION RATE: Sed Rate: 22 mm/hr — ABNORMAL HIGH (ref 0–16)

## 2017-07-28 LAB — MRSA PCR SCREENING: MRSA by PCR: NEGATIVE

## 2017-07-28 LAB — GLUCOSE, CAPILLARY
GLUCOSE-CAPILLARY: 216 mg/dL — AB (ref 65–99)
Glucose-Capillary: 182 mg/dL — ABNORMAL HIGH (ref 65–99)

## 2017-07-28 MED ORDER — LOSARTAN POTASSIUM 50 MG PO TABS
50.0000 mg | ORAL_TABLET | Freq: Every day | ORAL | Status: DC
  Administered 2017-07-28 – 2017-08-01 (×5): 50 mg via ORAL
  Filled 2017-07-28 (×5): qty 1

## 2017-07-28 MED ORDER — HYDROCODONE-ACETAMINOPHEN 10-325 MG PO TABS
1.0000 | ORAL_TABLET | Freq: Four times a day (QID) | ORAL | Status: DC | PRN
  Administered 2017-07-28 – 2017-08-01 (×12): 1 via ORAL
  Filled 2017-07-28 (×12): qty 1

## 2017-07-28 MED ORDER — HEPARIN BOLUS VIA INFUSION
5000.0000 [IU] | Freq: Once | INTRAVENOUS | Status: DC
  Filled 2017-07-28: qty 5000

## 2017-07-28 MED ORDER — TRIAMCINOLONE ACETONIDE 0.1 % EX CREA
1.0000 "application " | TOPICAL_CREAM | Freq: Two times a day (BID) | CUTANEOUS | Status: DC
  Administered 2017-07-28 – 2017-08-01 (×7): 1 via TOPICAL
  Filled 2017-07-28 (×2): qty 15

## 2017-07-28 MED ORDER — INSULIN ASPART 100 UNIT/ML ~~LOC~~ SOLN
0.0000 [IU] | Freq: Three times a day (TID) | SUBCUTANEOUS | Status: DC
  Administered 2017-07-28: 2 [IU] via SUBCUTANEOUS
  Administered 2017-07-28: 5 [IU] via SUBCUTANEOUS
  Administered 2017-07-29: 3 [IU] via SUBCUTANEOUS
  Administered 2017-07-29: 2 [IU] via SUBCUTANEOUS
  Filled 2017-07-28 (×2): qty 1

## 2017-07-28 MED ORDER — CYANOCOBALAMIN 1000 MCG/ML IJ SOLN: 1000.0000 ug | INTRAMUSCULAR | Status: DC

## 2017-07-28 MED ORDER — HEPARIN (PORCINE) IN NACL 100-0.45 UNIT/ML-% IJ SOLN
1600.0000 [IU]/h | INTRAMUSCULAR | Status: DC
  Filled 2017-07-28: qty 250

## 2017-07-28 MED ORDER — MORPHINE SULFATE (PF) 4 MG/ML IV SOLN
2.0000 mg | INTRAVENOUS | Status: DC | PRN
  Administered 2017-07-28: 2 mg via INTRAVENOUS
  Filled 2017-07-28: qty 1

## 2017-07-28 MED ORDER — ALBUTEROL SULFATE (2.5 MG/3ML) 0.083% IN NEBU: 3.0000 mL | INHALATION_SOLUTION | RESPIRATORY_TRACT | Status: DC | PRN

## 2017-07-28 MED ORDER — INSULIN GLARGINE 100 UNIT/ML ~~LOC~~ SOLN
10.0000 [IU] | Freq: Every day | SUBCUTANEOUS | Status: DC
  Administered 2017-07-28 – 2017-07-30 (×3): 10 [IU] via SUBCUTANEOUS
  Filled 2017-07-28 (×4): qty 0.1

## 2017-07-28 MED ORDER — INSULIN ASPART 100 UNIT/ML ~~LOC~~ SOLN
0.0000 [IU] | Freq: Every day | SUBCUTANEOUS | Status: DC
  Administered 2017-07-30: 2 [IU] via SUBCUTANEOUS
  Administered 2017-07-31: 4 [IU] via SUBCUTANEOUS

## 2017-07-28 MED ORDER — ACETAMINOPHEN 325 MG PO TABS
650.0000 mg | ORAL_TABLET | ORAL | Status: DC | PRN
  Administered 2017-07-28: 650 mg via ORAL
  Filled 2017-07-28: qty 2

## 2017-07-28 MED ORDER — MECLIZINE HCL 25 MG PO TABS: 25.0000 mg | ORAL_TABLET | Freq: Three times a day (TID) | ORAL | Status: DC | PRN

## 2017-07-28 MED ORDER — CLOTRIMAZOLE 1 % EX CREA
TOPICAL_CREAM | Freq: Two times a day (BID) | CUTANEOUS | Status: DC
  Administered 2017-07-28 – 2017-07-29 (×3): via TOPICAL
  Administered 2017-07-30: 1 via TOPICAL
  Administered 2017-07-30 – 2017-08-01 (×4): via TOPICAL
  Filled 2017-07-28 (×2): qty 15

## 2017-07-28 MED ORDER — FUROSEMIDE 40 MG PO TABS
40.0000 mg | ORAL_TABLET | Freq: Every day | ORAL | Status: DC
  Administered 2017-07-28 – 2017-08-01 (×5): 40 mg via ORAL
  Filled 2017-07-28 (×2): qty 1
  Filled 2017-07-28: qty 2
  Filled 2017-07-28 (×2): qty 1

## 2017-07-28 MED ORDER — IOPAMIDOL (ISOVUE-370) INJECTION 76%
INTRAVENOUS | Status: AC
  Administered 2017-07-28: 100 mL
  Filled 2017-07-28: qty 100

## 2017-07-28 MED ORDER — ENOXAPARIN SODIUM 40 MG/0.4ML ~~LOC~~ SOLN
40.0000 mg | SUBCUTANEOUS | Status: DC
  Administered 2017-07-28 – 2017-07-31 (×4): 40 mg via SUBCUTANEOUS
  Filled 2017-07-28 (×5): qty 0.4

## 2017-07-28 MED ORDER — ONDANSETRON HCL 4 MG/2ML IJ SOLN: 4.0000 mg | Freq: Four times a day (QID) | INTRAMUSCULAR | Status: DC | PRN

## 2017-07-28 NOTE — ED Notes (Signed)
attempted report 

## 2017-07-28 NOTE — Progress Notes (Signed)
   Follow Up Note  HPI: Please refer to H&P done earlier this morning for details.  Pt admitted earlier this morning complaining of chest pain and shortness of breath.  Noted to desaturate upon ambulation in the ER.  Keep on oxygen So far troponin x3-, EKG with no acute ST changes. CT chest angio done was negative for PE, but showed upper lobe predominant pulmonary fibrosis chagnes, specifically areas of honey combing and reticulation; the changes are peripheral based and worse in the upper lobes; esophagus is somewhat dilated. Pulmonary team was consulted: Recommend no steroids for now, ordered workup for ILD/hypersensitivity pneumonitis.  Will need barium swallow, ordered.  Cardiology consult in a.m, for further workup  Exam: CV: S1-S2 present, no added heart sound Lungs: Fine crackles noted bilaterally Abd: Soft, nontender, nondistended, bowel sounds present Ext: No peripheral edema  Present on Admission: . Chest pain . DM (diabetes mellitus), type 2, uncontrolled with complications ([facility]) . HTN (hypertension), benign   Plan as above

## 2017-07-28 NOTE — ED Notes (Signed)
EMERGENCY DEPARTMENT  US GUIDANCE EXAM Emergency Ultrasound:  US Guidance for Needle Guidance  INDICATIONS: Difficult vascular access Linear probe used in real-time to visualize location of needle entry through skin.   PERFORMED BY: Myself IMAGES ARCHIVED?: No LIMITATIONS: none VIEWS USED: Transverse INTERPRETATION: Needle visualized within vein   Tolerated well w/no complications.   Aaron Edelman RN  1:26 AM 07/28/17

## 2017-07-28 NOTE — H&P (Addendum)
History and Physical    [name] ZJI:[id number] DOB: 10-27-1929 DOA: 07/27/2017  PCP: Vonna Drafts, FNP  Patient coming from: Home  I have personally briefly reviewed patient's old medical records in [place]  Chief Complaint: Chest pain  HPI: [name] is a 84 y.o. male with medical history significant of HTN, HLD, diastolic CHF, DM2.  Patient presents to the ED with c/o constant ongoing mid chest pain for past 1 day.  Quality is "achy", no radiation.  Does have associated SOB that has been increasing.  Patient states that he has difficulty walking across the room now because of shortness of breath.  He has mild lower extremity swelling does have pain in groins and tenderness of L calf.  Was at urologists office today who sent him to ED due to SOB complaints.   ED Course: First 2 trops are 0.00 and 0.04, CP has been constant and present since arrival in ED.  D.Dimer is positive at 1.1.  BNP neg.  CXR clear.   Review of Systems: As per HPI otherwise 10 point review of systems negative.   Past Medical History:  Diagnosis Date  . Arthritis   . Diabetes mellitus   . H/O hiatal hernia   . Hyperlipidemia   . Hypertension   . Neuropathy   . Obesity     Past Surgical History:  Procedure Laterality Date  . ABDOMINAL SURGERY     pt has abdominal scarring and states he had sx but not sure what kind  . BACK SURGERY    . CHOLECYSTECTOMY       reports that he quit smoking about 20 years ago. His smoking use included cigarettes. he has never used smokeless tobacco. He reports that he does not drink alcohol or use drugs.  No Known Allergies  Family History  Problem Relation Age of Onset  . Cancer Mother      Prior to Admission medications   Medication Sig Start Date End Date Taking? Authorizing Provider  acetaminophen (TYLENOL) 500 MG tablet Take 2 tablets (1,000 mg total) by mouth every 6 (six) hours as needed. Patient taking differently: Take  1,000 mg by mouth every 6 (six) hours as needed for mild pain.  06/03/16  Yes Pfeiffer, Jeannie Done, MD  Aspirin-Salicylamide-Caffeine (BC HEADACHE POWDER PO) Take 1 packet by mouth daily as needed (Headache).   Yes [provider]  betamethasone valerate ointment (VALISONE) 0.1 % Apply 1 application topically 2 (two) times daily.   Yes [provider]  furosemide (LASIX) 40 MG tablet Take 40 mg by mouth daily.    Yes [provider]  HYDROcodone-acetaminophen (NORCO) 10-325 MG per tablet Take 1 tablet by mouth every 6 (six) hours as needed. Patient taking differently: Take 1 tablet by mouth 4 (four) times daily.  08/11/14  Yes Drenda Freeze, MD  losartan (COZAAR) 50 MG tablet Take 50 mg by mouth daily. 05/05/15  Yes [provider]  nitroGLYCERIN (NITROSTAT) 0.4 MG SL tablet Place 1 tablet (0.4 mg total) under the tongue every 5 (five) minutes x 3 doses as needed for chest pain. 08/31/13  Yes Dixie Dials, MD  PROAIR HFA 108 (90 BASE) MCG/ACT inhaler Inhale 1 puff into the lungs every 4 (four) hours as needed for wheezing or shortness of breath.  02/11/14  Yes [provider]  cyanocobalamin (,VITAMIN B-12,) 1000 MCG/ML injection Inject 1,000 mcg into the muscle every 30 (thirty) days. 10/03/16   [provider]  meclizine (ANTIVERT) 50 MG tablet Take 0.5 tablets (25 mg total) by mouth 3 (three) times daily as needed for dizziness. Patient not taking: Reported on 10/12/2016 08/29/14   Everlene Balls, MD  hydrALAZINE (APRESOLINE) 25 MG tablet Take 1 tablet (25 mg total) by mouth 3 (three) times daily. 10/26/12 10/26/12  Teressa Lower, MD    Physical Exam: Vitals:   07/27/17 2316 07/27/17 2345 07/28/17 0000 07/28/17 0015  BP: (!) 125/105 135/83 (!) 123/99 (!) 149/95  Pulse: 88 86 91 93  Resp: (!) 26 (!) 21 20 18   Temp:      TempSrc:      SpO2: 95% 93% 93% 92%  Weight:      Height:        Constitutional: NAD, calm, comfortable Eyes: PERRL, lids and  conjunctivae normal ENMT: Mucous membranes are moist. Posterior pharynx clear of any exudate or lesions.Normal dentition.  Neck: normal, supple, no masses, no thyromegaly Respiratory: clear to auscultation bilaterally, no wheezing, no crackles. Normal respiratory effort. No accessory muscle use.  Cardiovascular: Regular rate and rhythm, no murmurs / rubs / gallops. No extremity edema. 2+ pedal pulses. No carotid bruits.  Abdomen: no tenderness, no masses palpated. No hepatosplenomegaly. Bowel sounds positive.  Musculoskeletal: L calf tenderness present.  Skin: no rashes, lesions, ulcers. No induration Neurologic: CN 2-12 grossly intact. Sensation intact, DTR normal. Strength 5/5 in all 4.  Psychiatric: Normal judgment and insight. Alert and oriented x 3. Normal mood.    Labs on Admission: I have personally reviewed following labs and imaging studies  CBC: Recent Labs  Lab 07/27/17 1934  WBC 7.9  HGB 16.2  HCT 48.2  MCV 93.6  PLT 751   Basic Metabolic Panel: Recent Labs  Lab 07/27/17 1449  NA 137  K 4.7  CL 103  CO2 23  GLUCOSE 167*  BUN 10  CREATININE 1.16  CALCIUM 9.0   GFR: Estimated Creatinine Clearance: 59.3 mL/min (by C-G formula based on SCr of 1.16 mg/dL). Liver Function Tests: No results for input(s): AST, ALT, ALKPHOS, BILITOT, PROT, ALBUMIN in the last 168 hours. No results for input(s): LIPASE, AMYLASE in the last 168 hours. No results for input(s): AMMONIA in the last 168 hours. Coagulation Profile: No results for input(s): INR, PROTIME in the last 168 hours. Cardiac Enzymes: No results for input(s): CKTOTAL, CKMB, CKMBINDEX, TROPONINI in the last 168 hours. BNP (last 3 results) No results for input(s): PROBNP in the last 8760 hours. HbA1C: No results for input(s): HGBA1C in the last 72 hours. CBG: No results for input(s): GLUCAP in the last 168 hours. Lipid Profile: No results for input(s): CHOL, HDL, LDLCALC, TRIG, CHOLHDL, LDLDIRECT in the last 72  hours. Thyroid Function Tests: No results for input(s): TSH, T4TOTAL, FREET4, T3FREE, THYROIDAB in the last 72 hours. Anemia Panel: No results for input(s): VITAMINB12, FOLATE, FERRITIN, TIBC, IRON, RETICCTPCT in the last 72 hours. Urine analysis:    Component Value Date/Time   COLORURINE YELLOW 06/10/2017 [place] 06/10/2017 1208   LABSPEC 1.020 06/10/2017 1208   PHURINE 7.0 06/10/2017 1208   GLUCOSEU 50 (A) 06/10/2017 1208   HGBUR LARGE (A) 06/10/2017 1208   BILIRUBINUR NEGATIVE 06/10/2017 1208   KETONESUR 5 (A) 06/10/2017 1208   PROTEINUR 100 (A) 06/10/2017 1208   UROBILINOGEN 0.2 03/23/2014 1805   NITRITE NEGATIVE 06/10/2017 1208   LEUKOCYTESUR SMALL (A) 06/10/2017 1208    Radiological Exams on Admission: Dg Chest 2 View  Result Date: 07/27/2017 CLINICAL DATA:  Chest pain and shortness of breath for 2 weeks. EXAM: CHEST  2 VIEW COMPARISON:  06/10/2017 and prior radiographs FINDINGS: Cardiomegaly and pulmonary vascular congestion noted. Chronic interstitial opacities are again noted. No definite airspace disease noted. There is no evidence of pulmonary edema, suspicious pulmonary nodule/mass, pleural effusion, or pneumothorax. No acute bony abnormalities are identified. IMPRESSION: 1. No definite acute abnormality. 2. Cardiomegaly and chronic interstitial opacities. Electronically Signed   By: Margarette Canada M.D.   On: 07/27/2017 16:03    EKG: Independently reviewed.  Assessment/Plan Principal Problem:   Chest pain Active Problems:   DM (diabetes mellitus), type 2, uncontrolled with complications ([facility])   HTN (hypertension), benign    1. Chest pain - 1. Positive D.Dimer 2. Positive L calf tenderness, also ache in groin 3. The EKG appears to show a new onset S1QT3 pattern that doesn't appear to have been present on EKG last month! 4. Initially unable to get CTA chest due to IV access.  But they were just able to get a 20 gague in R AC.  CTA chest ordered  Stat. 5. If positive begin the heparin gtt 6. If negative, hold off on heparin gtt and proceed to cardiac work up. 7. Will put patient on heart healthy / carb mod diet 8. 2d echo ordered 9. BLE [place] to r/o DVT ordered 10. Serial trops 11. Tele monitor 12. If PE work up neg, consider cards eval in AM. 13. Morphine PRN pain 2. HTN - continue home cozaar and lasix 3. DM2 - 1. looks like he isnt on any meds at home 2. Will do diet control and CBG checks AC/HS to start with.  DVT prophylaxis: Heparin gtt vs lovenox if CTA neg for PE Code Status: Full Family Communication: No family in room Disposition Plan: Home after admit Consults called: None Admission status: Place in obs   Dayannara Pascal, [facility] Pager [phone]  If 7AM-7PM, please contact day team taking care of patient [email or web address] Password [email or web address]  07/28/2017, 1:22 AM

## 2017-07-28 NOTE — Progress Notes (Signed)
ANTICOAGULATION CONSULT NOTE - Initial Consult  Pharmacy Consult for heparin Indication: r/o PE  No Known Allergies  Patient Measurements: Height: 5\' 10"  (177.8 cm) Weight: 255 lb (115.7 kg) IBW/kg (Calculated) : 73 Heparin Dosing Weight: 100kg  Vital Signs: Temp: 98 F (36.7 C) (01/25 1447) Temp Source: Oral (01/25 1447) BP: 149/95 (01/26 0015) Pulse Rate: 93 (01/26 0015)  Labs: Recent Labs    07/27/17 1449 07/27/17 1934  HGB  --  16.2  HCT  --  48.2  PLT  --  185  CREATININE 1.16  --     Estimated Creatinine Clearance: 59.3 mL/min (by C-G formula based on SCr of 1.16 mg/dL).   Medical History: Past Medical History:  Diagnosis Date  . Arthritis   . Diabetes mellitus   . H/O hiatal hernia   . Hyperlipidemia   . Hypertension   . Neuropathy   . Obesity     Assessment: 84yo male c/o CP associated w/ worsening SOB, D-dimer elevated, concern for PE, awaiting VQ scan, to begin heparin.  Goal of Therapy:  Heparin level 0.3-0.7 units/ml Monitor platelets by anticoagulation protocol: Yes   Plan:  Will give heparin 5000 units IV bolus x1 followed by gtt at 1600 units/hr and monitor heparin levels and CBC.  Wynona Neat, PharmD, BCPS  07/28/2017,1:16 AM

## 2017-07-28 NOTE — ED Notes (Signed)
Admitting MD aware patient requesting diabetes medication. Admitting Md aware of patient cbg

## 2017-07-28 NOTE — Progress Notes (Addendum)
CTA neg for PE, shows suspected alveolitis instead.  Will order lovenox DVT ppx, cancel heparin gtt (not started).

## 2017-07-28 NOTE — ED Notes (Signed)
Pt stood at the bedside with assistance to use a urinal.  His HR increased to 165 and his SpO2 dropped to 81%.  Hayley, RN placed a nasal cannula on the pt and his SpO2 is now 97%. His HR has returned to 89.

## 2017-07-28 NOTE — Consult Note (Signed)
Name: [name] MRN: [id number] DOB: May 31, 1930    ADMISSION DATE:  07/27/2017 CONSULTATION DATE:  1/26  REFERRING MD :  TRH  CHIEF COMPLAINT:  IPF  BRIEF PATIENT DESCRIPTION: 84yo male former smoker (quit 20 years ago) with hx HTN, CHF, DM presented 1/25 with 1 day hx dyspnea, DOE and pleuritic chest pain.  Was apparently significantly SOB at urologists office who directed him to ER.  Admitted by [facility] and w/u neg for PE (neg CTA chest and neg BLE dopplers) but CT chest was concerning for chronic interstitial changes and PCCM consulted.   SIGNIFICANT EVENTS    STUDIES:  CTA chest 1/26>>> neg for PE, Chronic interstitial fibrosis with honeycomb changes   HISTORY OF PRESENT ILLNESS:  84yo male with hx HTN, CHF, DM presented 1/25 with 1 day hx dyspnea, DOE and pleuritic chest pain.  Was apparently significantly SOB at urologists office who directed him to ER.  Admitted by [facility] and w/u neg for PE (neg CTA chest and neg BLE dopplers) but CT chest was concerning for chronic interstitial changes and PCCM consulted.   Currently feeling a bit better although does still have c/o significant DOE and chest pain.  States he feels much better at rest with very little SOB but with very minimal activity get very SOB and feels chest tightness.   Some mild dysphagia occasionally.   No known fam hx autoimmune disease   Was Engineer, manufacturing, drove garbage truck.   PAST MEDICAL HISTORY :   has a past medical history of Arthritis, Diabetes mellitus, H/O hiatal hernia, Hyperlipidemia, Hypertension, Neuropathy, and Obesity.  has a past surgical history that includes Abdominal surgery; Back surgery; and Cholecystectomy. Prior to Admission medications   Medication Sig Start Date End Date Taking? Authorizing Provider  acetaminophen (TYLENOL) 500 MG tablet Take 2 tablets (1,000 mg total) by mouth every 6 (six) hours as needed. Patient taking differently: Take 1,000 mg by mouth every 6 (six) hours as  needed for mild pain.  06/03/16  Yes Pfeiffer, Jeannie Done, MD  Aspirin-Salicylamide-Caffeine (BC HEADACHE POWDER PO) Take 1 packet by mouth daily as needed (Headache).   Yes [provider]  betamethasone valerate ointment (VALISONE) 0.1 % Apply 1 application topically 2 (two) times daily.   Yes [provider]  furosemide (LASIX) 40 MG tablet Take 40 mg by mouth daily.    Yes [provider]  HYDROcodone-acetaminophen (NORCO) 10-325 MG per tablet Take 1 tablet by mouth every 6 (six) hours as needed. Patient taking differently: Take 1 tablet by mouth 4 (four) times daily.  08/11/14  Yes Drenda Freeze, MD  losartan (COZAAR) 50 MG tablet Take 50 mg by mouth daily. 05/05/15  Yes [provider]  nitroGLYCERIN (NITROSTAT) 0.4 MG SL tablet Place 1 tablet (0.4 mg total) under the tongue every 5 (five) minutes x 3 doses as needed for chest pain. 08/31/13  Yes Dixie Dials, MD  PROAIR HFA 108 (90 BASE) MCG/ACT inhaler Inhale 1 puff into the lungs every 4 (four) hours as needed for wheezing or shortness of breath.  02/11/14  Yes [provider]  cyanocobalamin (,VITAMIN B-12,) 1000 MCG/ML injection Inject 1,000 mcg into the muscle every 30 (thirty) days. 10/03/16   [provider]  meclizine (ANTIVERT) 50 MG tablet Take 0.5 tablets (25 mg total) by mouth 3 (three) times daily as needed for dizziness. Patient not taking: Reported on 10/12/2016 08/29/14   Everlene Balls, MD  hydrALAZINE (APRESOLINE) 25 MG tablet Take 1  tablet (25 mg total) by mouth 3 (three) times daily. 10/26/12 10/26/12  Teressa Lower, MD   No Known Allergies  FAMILY HISTORY:  family history includes Cancer in his mother. SOCIAL HISTORY:  reports that he quit smoking about 20 years ago. His smoking use included cigarettes. he has never used smokeless tobacco. He reports that he does not drink alcohol or use drugs.  REVIEW OF SYSTEMS:   As per HPI - All other systems reviewed and were neg.    SUBJECTIVE:   VITAL SIGNS: Temp:  [98 F (36.7 C)] 98 F (36.7 C) (01/25 1447) Pulse Rate:  [74-107] 86 (01/26 1330) Resp:  [14-26] 19 (01/26 1330) BP: (103-149)/(60-116) 126/60 (01/26 1330) SpO2:  [90 %-98 %] 96 % (01/26 1330) Weight:  [115.7 kg (255 lb)] 115.7 kg (255 lb) (01/25 1447)  PHYSICAL EXAMINATION: General:  Pleasant elderly male, NAD  Neuro:  Awake, alert, appropriate HEENT:  Mm moist, no JVD  Cardiovascular:  s1s2 rrr Lungs:  resps even non labored at rest, few scattered crackles Abdomen:  Round, soft, nt Musculoskeletal:  Warm and dry, no edema    Recent Labs  Lab 07/27/17 1449  NA 137  K 4.7  CL 103  CO2 23  BUN 10  CREATININE 1.16  GLUCOSE 167*   Recent Labs  Lab 07/27/17 1934 07/28/17 0626  HGB 16.2 16.3  HCT 48.2 48.7  WBC 7.9 8.2  PLT 185 179   Dg Chest 2 View  Result Date: 07/27/2017 CLINICAL DATA:  Chest pain and shortness of breath for 2 weeks. EXAM: CHEST  2 VIEW COMPARISON:  06/10/2017 and prior radiographs FINDINGS: Cardiomegaly and pulmonary vascular congestion noted. Chronic interstitial opacities are again noted. No definite airspace disease noted. There is no evidence of pulmonary edema, suspicious pulmonary nodule/mass, pleural effusion, or pneumothorax. No acute bony abnormalities are identified. IMPRESSION: 1. No definite acute abnormality. 2. Cardiomegaly and chronic interstitial opacities. Electronically Signed   By: Margarette Canada M.D.   On: 07/27/2017 16:03   Ct Angio Chest Pe W Or Wo Contrast  Result Date: 07/28/2017 CLINICAL DATA:  Mid chest pain for 1 day. Shortness of breath. Mild extremity swelling. Positive D-dimer. EXAM: CT ANGIOGRAPHY CHEST WITH CONTRAST TECHNIQUE: Multidetector CT imaging of the chest was performed using the standard protocol during bolus administration of intravenous contrast. Multiplanar CT image reconstructions and MIPs were obtained to evaluate the vascular anatomy. CONTRAST:  118m ISOVUE-370 IOPAMIDOL  (ISOVUE-370) INJECTION 76% COMPARISON:  03/23/2014 FINDINGS: Cardiovascular: Good opacification of the central and segmental pulmonary arteries. No focal filling defects. No evidence of significant pulmonary embolus. Normal caliber thoracic aorta. Normal heart size. No pericardial effusion. Scattered aortic calcifications. Mediastinum/Nodes: Scattered mediastinal lymph nodes are not pathologically enlarged. Esophagus is decompressed. Lungs/Pleura: Emphysematous changes and fibrosis in the lungs most prominent along the peripheral regions. Mild bronchiectasis likely due to traction changes. Peripheral ground-glass changes may indicate active alveolitis. No consolidation. Low lung volumes likely related to the pulmonary parenchymal process. No pleural effusions. No pneumothorax. Airways appear patent. Upper Abdomen: Multiple low-attenuation lesions demonstrated throughout the liver. Characterization is difficult due to streak artifact. Lesions appear unchanged since previous study and likely represent cysts. Previous contrast-enhanced CT abdomen and pelvis from 06/10/2017 also consistent with cysts. Surgical absence of the gallbladder. Musculoskeletal: Degenerative changes in the spine. No destructive bone lesions. Review of the MIP images confirms the above findings. IMPRESSION: 1. No evidence of significant pulmonary embolus. 2. 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  Associated low lung volumes. 3. Emphysematous changes in the lungs. 4. Multiple hepatic cysts. Aortic Atherosclerosis (ICD10-I70.0) and Emphysema (ICD10-J43.9). Electronically Signed   By: Lucienne Capers M.D.   On: 07/28/2017 02:39    ASSESSMENT / PLAN:  Abnormal chest CT -- ?pulmonary fibrosis.  Does have some mild hypoxia, mostly with exertion.   PLAN -  Hypersensitivity pneumonitis panel  Ambulatory desat again prior to d/c  Can likely hold off on steroids for now  Will order ILD panel - ana,  ESR, crp, anti-scl 70, ssa, ssb, antijo-1, aldolase, RF, CCP anti-centromere  Will need outpt pulmonary follow up Agree with ongoing cards w/u as well in setting chest pain which actually seems to be his biggest c/o -trop neg thus far Speech assessment +/- barium swallow to eval mild esophageal dilation on CT    Nickolas Madrid, NP 07/28/2017  1:55 PM Pager: [phone] or [phone]

## 2017-07-28 NOTE — Progress Notes (Signed)
Bilateral lower extremity venous duplex completed. No evidence of DVT, superficial thrombosis, or Baker's cyst. Toma Copier, RVS 07/28/2017 9:20 AM

## 2017-07-28 NOTE — ED Notes (Signed)
Troponin pending at 0114 was ran as istat troponin.

## 2017-07-28 NOTE — ED Notes (Signed)
Pt's CBG 254.  Informed Hayley, RN and Dr. Vanita Panda.

## 2017-07-29 ENCOUNTER — Observation Stay (HOSPITAL_COMMUNITY): Payer: Medicare HMO

## 2017-07-29 DIAGNOSIS — Z79891 Long term (current) use of opiate analgesic: Secondary | ICD-10-CM | POA: Diagnosis not present

## 2017-07-29 DIAGNOSIS — J849 Interstitial pulmonary disease, unspecified: Secondary | ICD-10-CM | POA: Diagnosis present

## 2017-07-29 DIAGNOSIS — N182 Chronic kidney disease, stage 2 (mild): Secondary | ICD-10-CM | POA: Diagnosis present

## 2017-07-29 DIAGNOSIS — Z79899 Other long term (current) drug therapy: Secondary | ICD-10-CM | POA: Diagnosis not present

## 2017-07-29 DIAGNOSIS — K219 Gastro-esophageal reflux disease without esophagitis: Secondary | ICD-10-CM | POA: Diagnosis present

## 2017-07-29 DIAGNOSIS — Z9049 Acquired absence of other specified parts of digestive tract: Secondary | ICD-10-CM | POA: Diagnosis not present

## 2017-07-29 DIAGNOSIS — Z87891 Personal history of nicotine dependence: Secondary | ICD-10-CM | POA: Diagnosis not present

## 2017-07-29 DIAGNOSIS — E1140 Type 2 diabetes mellitus with diabetic neuropathy, unspecified: Secondary | ICD-10-CM | POA: Diagnosis present

## 2017-07-29 DIAGNOSIS — R0602 Shortness of breath: Secondary | ICD-10-CM | POA: Diagnosis present

## 2017-07-29 DIAGNOSIS — R011 Cardiac murmur, unspecified: Secondary | ICD-10-CM | POA: Diagnosis present

## 2017-07-29 DIAGNOSIS — E785 Hyperlipidemia, unspecified: Secondary | ICD-10-CM | POA: Diagnosis present

## 2017-07-29 DIAGNOSIS — Z7952 Long term (current) use of systemic steroids: Secondary | ICD-10-CM | POA: Diagnosis not present

## 2017-07-29 DIAGNOSIS — R079 Chest pain, unspecified: Secondary | ICD-10-CM | POA: Diagnosis not present

## 2017-07-29 DIAGNOSIS — I440 Atrioventricular block, first degree: Secondary | ICD-10-CM | POA: Diagnosis present

## 2017-07-29 DIAGNOSIS — Z6836 Body mass index (BMI) 36.0-36.9, adult: Secondary | ICD-10-CM | POA: Diagnosis not present

## 2017-07-29 DIAGNOSIS — I5030 Unspecified diastolic (congestive) heart failure: Secondary | ICD-10-CM

## 2017-07-29 DIAGNOSIS — R072 Precordial pain: Secondary | ICD-10-CM | POA: Diagnosis not present

## 2017-07-29 DIAGNOSIS — K224 Dyskinesia of esophagus: Secondary | ICD-10-CM | POA: Diagnosis present

## 2017-07-29 DIAGNOSIS — I10 Essential (primary) hypertension: Secondary | ICD-10-CM | POA: Diagnosis not present

## 2017-07-29 DIAGNOSIS — M1990 Unspecified osteoarthritis, unspecified site: Secondary | ICD-10-CM | POA: Diagnosis present

## 2017-07-29 DIAGNOSIS — J9621 Acute and chronic respiratory failure with hypoxia: Secondary | ICD-10-CM | POA: Diagnosis present

## 2017-07-29 DIAGNOSIS — R0789 Other chest pain: Secondary | ICD-10-CM | POA: Diagnosis present

## 2017-07-29 DIAGNOSIS — E669 Obesity, unspecified: Secondary | ICD-10-CM | POA: Diagnosis present

## 2017-07-29 DIAGNOSIS — E1165 Type 2 diabetes mellitus with hyperglycemia: Secondary | ICD-10-CM | POA: Diagnosis not present

## 2017-07-29 DIAGNOSIS — E1122 Type 2 diabetes mellitus with diabetic chronic kidney disease: Secondary | ICD-10-CM | POA: Diagnosis present

## 2017-07-29 DIAGNOSIS — B379 Candidiasis, unspecified: Secondary | ICD-10-CM | POA: Diagnosis present

## 2017-07-29 DIAGNOSIS — I130 Hypertensive heart and chronic kidney disease with heart failure and stage 1 through stage 4 chronic kidney disease, or unspecified chronic kidney disease: Secondary | ICD-10-CM | POA: Diagnosis present

## 2017-07-29 DIAGNOSIS — E118 Type 2 diabetes mellitus with unspecified complications: Secondary | ICD-10-CM | POA: Diagnosis not present

## 2017-07-29 DIAGNOSIS — I5032 Chronic diastolic (congestive) heart failure: Secondary | ICD-10-CM | POA: Diagnosis present

## 2017-07-29 LAB — CBC
HEMATOCRIT: 48 % (ref 39.0–52.0)
HEMOGLOBIN: 15.8 g/dL (ref 13.0–17.0)
MCH: 31 pg (ref 26.0–34.0)
MCHC: 32.9 g/dL (ref 30.0–36.0)
MCV: 94.1 fL (ref 78.0–100.0)
Platelets: 176 10*3/uL (ref 150–400)
RBC: 5.1 MIL/uL (ref 4.22–5.81)
RDW: 14.4 % (ref 11.5–15.5)
WBC: 6.4 10*3/uL (ref 4.0–10.5)

## 2017-07-29 LAB — BASIC METABOLIC PANEL
ANION GAP: 9 (ref 5–15)
BUN: 10 mg/dL (ref 6–20)
CALCIUM: 8.7 mg/dL — AB (ref 8.9–10.3)
CHLORIDE: 100 mmol/L — AB (ref 101–111)
CO2: 29 mmol/L (ref 22–32)
Creatinine, Ser: 1.19 mg/dL (ref 0.61–1.24)
GFR calc non Af Amer: 54 mL/min — ABNORMAL LOW (ref >60)
Glucose, Bld: 180 mg/dL — ABNORMAL HIGH (ref 65–99)
Potassium: 4 mmol/L (ref 3.5–5.1)
Sodium: 138 mmol/L (ref 135–145)

## 2017-07-29 LAB — GLUCOSE, CAPILLARY
GLUCOSE-CAPILLARY: 235 mg/dL — AB (ref 65–99)
GLUCOSE-CAPILLARY: 255 mg/dL — AB (ref 65–99)
GLUCOSE-CAPILLARY: 199 mg/dL — AB (ref 65–99)
Glucose-Capillary: 158 mg/dL — ABNORMAL HIGH (ref 65–99)

## 2017-07-29 LAB — RHEUMATOID FACTOR: RHEUMATOID FACTOR: 11.1 [IU]/mL (ref 0.0–13.9)

## 2017-07-29 MED ORDER — INSULIN ASPART 100 UNIT/ML ~~LOC~~ SOLN
0.0000 [IU] | Freq: Three times a day (TID) | SUBCUTANEOUS | Status: DC
  Administered 2017-07-29: 5 [IU] via SUBCUTANEOUS
  Administered 2017-07-30: 3 [IU] via SUBCUTANEOUS
  Administered 2017-07-30: 8 [IU] via SUBCUTANEOUS
  Administered 2017-07-31: 5 [IU] via SUBCUTANEOUS
  Administered 2017-07-31: 11 [IU] via SUBCUTANEOUS
  Administered 2017-07-31: 5 [IU] via SUBCUTANEOUS
  Administered 2017-08-01: 3 [IU] via SUBCUTANEOUS
  Administered 2017-08-01: 11 [IU] via SUBCUTANEOUS

## 2017-07-29 MED ORDER — IPRATROPIUM-ALBUTEROL 0.5-2.5 (3) MG/3ML IN SOLN
3.0000 mL | Freq: Two times a day (BID) | RESPIRATORY_TRACT | Status: DC
  Administered 2017-07-29 – 2017-08-01 (×5): 3 mL via RESPIRATORY_TRACT
  Filled 2017-07-29 (×6): qty 3

## 2017-07-29 MED ORDER — GI COCKTAIL ~~LOC~~: 30.0000 mL | Freq: Three times a day (TID) | ORAL | Status: DC | PRN

## 2017-07-29 MED ORDER — PANTOPRAZOLE SODIUM 40 MG PO TBEC
40.0000 mg | DELAYED_RELEASE_TABLET | Freq: Two times a day (BID) | ORAL | Status: DC
  Administered 2017-07-29 – 2017-08-01 (×7): 40 mg via ORAL
  Filled 2017-07-29 (×7): qty 1

## 2017-07-29 MED ORDER — DICLOFENAC SODIUM 1 % TD GEL
2.0000 g | Freq: Four times a day (QID) | TRANSDERMAL | Status: DC
  Administered 2017-07-29 – 2017-08-01 (×12): 2 g via TOPICAL
  Filled 2017-07-29: qty 100

## 2017-07-29 MED ORDER — IPRATROPIUM-ALBUTEROL 0.5-2.5 (3) MG/3ML IN SOLN
3.0000 mL | Freq: Four times a day (QID) | RESPIRATORY_TRACT | Status: DC
  Administered 2017-07-29: 3 mL via RESPIRATORY_TRACT
  Filled 2017-07-29: qty 3

## 2017-07-29 NOTE — Progress Notes (Addendum)
PROGRESS NOTE  [name] IDP:[id number] DOB: 09-Aug-1929 DOA: 07/27/2017 PCP: Vonna Drafts, FNP  HPI/Recap of past 66 hours: 84 year old male history of hypertension, diabetes, hyperlipidemia, diastolic CHF, presents to the ER after being instructed by his urologist to come due to history of worsening dyspnea, on exertion, pleuritic chest pain, left calf tenderness for the past couple of days.  Patient reported DOE and chest pain has been ongoing for couple of years.  Patient has been seen intermittently for chest pain and he was described as chest wall pain, no further workup was done.  In the ED, patient noted to desaturate to the 80s on ambulation. CT angio was negative for PE, but was concerning for chronic interstitial changes/diffuse parenchymal lung disease as well as patulous esophagus. ED trop was negative, EKG no acute ST changes. PCCM consulted.  Patient admitted for further management.  Today, patient still reported mid sternal chest pain which is tender to palpate, pleuritic.  Denies any association with food. Still short of breath, denies any nausea/vomiting, diaphoresis, dizziness, fever/chills.  Assessment/Plan: Principal Problem:   Chest pain Active Problems:   DM (diabetes mellitus), type 2, uncontrolled with complications (HCC)   HTN (hypertension), benign  Atypical chest pain Currently present even at rest, TTP, pleuritic, chronic ??GERD/dilated esophagus shown on CT Trop X 3 neg, EKG no acute ST changes ECHO pending Barium swallow pending to look for esophageal pathology Started a trial of PPI BID, GI cocktail prn Consult cardiology in am for r/o of cardiac cause, will keep NPO for possible stress test in am  DOE likely due to diffuse parenchymal lung disease Chronic DOE, progressively getting worse CT showed: CT chest images reviewed showing upper lobe predominant pulmonary fibrosis chagnes, specifically areas of honey combing and reticulation; the  changes are peripheral based and worse in the upper lobes; esophagus is patulous  DDx: Chronic hypersensitivity pneumonitis Vs UIP Vs connective tissue disease Maintain on supplemental O2 as pt desaturates on ambulation PCCM on board: Pneumonitis/ILD panel, ANA, ESR, CRP, CCP, RF, SSA/SSB, SCL 70, centromere, Jo-1, Aldolase. Would need outpt PFT, HRCT  Hypertension Controlled Continue home losartan, held home hydralazine  DM type 2 Uncontrolled SSI, lantus Repeat A1c in am  HLD Lipid panel in am   Code Status: Full code  Family Communication: None at bedside  Disposition Plan: Home once stable   Consultants:  PCCM  Procedures:  None  Antimicrobials:  None  DVT prophylaxis:  Lovenox   Objective: Vitals:   07/29/17 0520 07/29/17 1024 07/29/17 1211 07/29/17 1546  BP: 113/73  104/68 94/68  Pulse: (!) 104  84 99  Resp: 20  14 12   Temp: 98.3 F (36.8 C)  98.1 F (36.7 C) 98 F (36.7 C)  TempSrc: Oral  Oral Oral  SpO2: 97% 97% 100% 98%  Weight: 111.7 kg (246 lb 4.8 oz)     Height:        Intake/Output Summary (Last 24 hours) at 07/29/2017 1646 Last data filed at 07/29/2017 1212 Gross per 24 hour  Intake 720 ml  Output 1301 ml  Net -581 ml   Filed Weights   07/27/17 1447 07/28/17 1428 07/29/17 0520  Weight: 115.7 kg (255 lb) 115 kg (253 lb 8.5 oz) 111.7 kg (246 lb 4.8 oz)    Exam:   General: Alert, awake, oriented, NAD  Cardiovascular: S1, S2 present, no added heart sounds  Respiratory: Fine crackles noted bilaterally  Abdomen: Soft, nontender, nondistended, bowel sounds present  Musculoskeletal: No  pedal edema bilaterally  Skin: Normal  Psychiatry: Normal   Data Reviewed: CBC: Recent Labs  Lab 07/27/17 1934 07/28/17 0626 07/29/17 0738  WBC 7.9 8.2 6.4  HGB 16.2 16.3 15.8  HCT 48.2 48.7 48.0  MCV 93.6 93.8 94.1  PLT 185 179 093   Basic Metabolic Panel: Recent Labs  Lab 07/27/17 1449 07/29/17 0738  NA 137 138  K 4.7 4.0    CL 103 100*  CO2 23 29  GLUCOSE 167* 180*  BUN 10 10  CREATININE 1.16 1.19  CALCIUM 9.0 8.7*   GFR: Estimated Creatinine Clearance: 56.8 mL/min (by C-G formula based on SCr of 1.19 mg/dL). Liver Function Tests: No results for input(s): AST, ALT, ALKPHOS, BILITOT, PROT, ALBUMIN in the last 168 hours. No results for input(s): LIPASE, AMYLASE in the last 168 hours. No results for input(s): AMMONIA in the last 168 hours. Coagulation Profile: No results for input(s): INR, PROTIME in the last 168 hours. Cardiac Enzymes: Recent Labs  Lab 07/28/17 0626 07/28/17 1520  TROPONINI <0.03 <0.03   BNP (last 3 results) No results for input(s): PROBNP in the last 8760 hours. HbA1C: No results for input(s): HGBA1C in the last 72 hours. CBG: Recent Labs  Lab 07/28/17 1151 07/28/17 1638 07/28/17 2128 07/29/17 0755 07/29/17 1139  GLUCAP 178* 216* 182* 158* 235*   Lipid Profile: No results for input(s): CHOL, HDL, LDLCALC, TRIG, CHOLHDL, LDLDIRECT in the last 72 hours. Thyroid Function Tests: No results for input(s): TSH, T4TOTAL, FREET4, T3FREE, THYROIDAB in the last 72 hours. Anemia Panel: No results for input(s): VITAMINB12, FOLATE, FERRITIN, TIBC, IRON, RETICCTPCT in the last 72 hours. Urine analysis:    Component Value Date/Time   COLORURINE YELLOW 06/10/2017 [place] 06/10/2017 1208   LABSPEC 1.020 06/10/2017 1208   PHURINE 7.0 06/10/2017 1208   GLUCOSEU 50 (A) 06/10/2017 1208   HGBUR LARGE (A) 06/10/2017 1208   BILIRUBINUR NEGATIVE 06/10/2017 1208   KETONESUR 5 (A) 06/10/2017 1208   PROTEINUR 100 (A) 06/10/2017 1208   UROBILINOGEN 0.2 03/23/2014 1805   NITRITE NEGATIVE 06/10/2017 1208   LEUKOCYTESUR SMALL (A) 06/10/2017 1208   Sepsis Labs: @LABRCNTIP (procalcitonin:4,lacticidven:4)  ) Recent Results (from the past 240 hour(s))  MRSA PCR Screening     Status: None   Collection Time: 07/28/17  6:00 PM  Result Value Ref Range Status   MRSA by PCR  NEGATIVE NEGATIVE Final    Comment:        The GeneXpert MRSA Assay (FDA approved for NASAL specimens only), is one component of a comprehensive MRSA colonization surveillance program. It is not intended to diagnose MRSA infection nor to guide or monitor treatment for MRSA infections.       Studies: No results found.  Scheduled Meds: . clotrimazole   Topical BID  . diclofenac sodium  2 g Topical QID  . enoxaparin (LOVENOX) injection  40 mg Subcutaneous Q24H  . furosemide  40 mg Oral Daily  . insulin aspart  0-5 Units Subcutaneous QHS  . insulin aspart  0-9 Units Subcutaneous TID WC  . insulin glargine  10 Units Subcutaneous QHS  . ipratropium-albuterol  3 mL Nebulization BID  . losartan  50 mg Oral Daily  . pantoprazole  40 mg Oral BID AC  . triamcinolone cream  1 application Topical BID    Continuous Infusions:   LOS: 0 days     Alma Friendly, MD [facility]   If 7PM-7AM, please contact night-coverage [email or web address] Password [facility] 07/29/2017,  4:46 PM

## 2017-07-29 NOTE — Progress Notes (Signed)
Pt c/o 10/10 CP while resting in bed. EKG obtained. Prn morphine given that relived pain. Will cont to monitor pt.

## 2017-07-29 NOTE — Progress Notes (Signed)
  Echocardiogram 2D Echocardiogram has been performed.  Kelley Polinsky T Karesha Trzcinski 07/29/2017, 5:56 PM

## 2017-07-30 ENCOUNTER — Inpatient Hospital Stay (HOSPITAL_COMMUNITY): Payer: Medicare HMO

## 2017-07-30 ENCOUNTER — Other Ambulatory Visit: Payer: Self-pay

## 2017-07-30 DIAGNOSIS — K224 Dyskinesia of esophagus: Secondary | ICD-10-CM

## 2017-07-30 LAB — SJOGRENS SYNDROME-B EXTRACTABLE NUCLEAR ANTIBODY

## 2017-07-30 LAB — LIPID PANEL
CHOL/HDL RATIO: 5 ratio
Cholesterol: 223 mg/dL — ABNORMAL HIGH (ref 0–200)
HDL: 45 mg/dL (ref >40)
LDL CALC: 150 mg/dL — AB (ref 0–99)
Triglycerides: 139 mg/dL (ref <150)
VLDL: 28 mg/dL (ref 0–40)

## 2017-07-30 LAB — GLUCOSE, CAPILLARY
GLUCOSE-CAPILLARY: 195 mg/dL — AB (ref 65–99)
GLUCOSE-CAPILLARY: 216 mg/dL — AB (ref 65–99)
GLUCOSE-CAPILLARY: 276 mg/dL — AB (ref 65–99)
Glucose-Capillary: 217 mg/dL — ABNORMAL HIGH (ref 65–99)

## 2017-07-30 LAB — HEMOGLOBIN A1C
HEMOGLOBIN A1C: 10 % — AB (ref 4.8–5.6)
MEAN PLASMA GLUCOSE: 240.3 mg/dL

## 2017-07-30 LAB — ANTI-JO 1 ANTIBODY, IGG

## 2017-07-30 LAB — ECHOCARDIOGRAM COMPLETE
Height: 70 in
Weight: 3940.8 oz

## 2017-07-30 LAB — ALDOLASE: Aldolase: 5.7 U/L (ref 3.3–10.3)

## 2017-07-30 LAB — SJOGRENS SYNDROME-A EXTRACTABLE NUCLEAR ANTIBODY

## 2017-07-30 LAB — ANTINUCLEAR ANTIBODIES, IFA: ANA Ab, IFA: NEGATIVE

## 2017-07-30 LAB — ANTI-SCLERODERMA ANTIBODY

## 2017-07-30 MED ORDER — TECHNETIUM TC 99M TETROFOSMIN IV KIT
30.0000 | PACK | Freq: Once | INTRAVENOUS | Status: AC | PRN
  Administered 2017-07-30: 30 via INTRAVENOUS

## 2017-07-30 MED ORDER — METOCLOPRAMIDE HCL 10 MG/10ML PO SOLN
5.0000 mg | Freq: Three times a day (TID) | ORAL | Status: DC
  Administered 2017-07-30 – 2017-08-01 (×7): 5 mg via ORAL
  Filled 2017-07-30 (×12): qty 5

## 2017-07-30 MED ORDER — REGADENOSON 0.4 MG/5ML IV SOLN
0.4000 mg | Freq: Once | INTRAVENOUS | Status: AC
  Administered 2017-07-30: 0.4 mg via INTRAVENOUS

## 2017-07-30 MED ORDER — TECHNETIUM TC 99M TETROFOSMIN IV KIT
10.0000 | PACK | Freq: Once | INTRAVENOUS | Status: AC | PRN
  Administered 2017-07-30: 10 via INTRAVENOUS

## 2017-07-30 MED ORDER — ATORVASTATIN CALCIUM 80 MG PO TABS
80.0000 mg | ORAL_TABLET | Freq: Every day | ORAL | Status: DC
  Administered 2017-07-30 – 2017-07-31 (×2): 80 mg via ORAL
  Filled 2017-07-30 (×2): qty 1

## 2017-07-30 MED ORDER — REGADENOSON 0.4 MG/5ML IV SOLN
INTRAVENOUS | Status: AC
  Filled 2017-07-30: qty 5

## 2017-07-30 MED ORDER — METOCLOPRAMIDE HCL 5 MG/5ML PO SOLN
5.0000 mg | Freq: Three times a day (TID) | ORAL | Status: DC
  Filled 2017-07-30 (×3): qty 5

## 2017-07-30 MED ORDER — LIVING WELL WITH DIABETES BOOK
Freq: Once | Status: DC
  Filled 2017-07-30: qty 1

## 2017-07-30 MED ORDER — METOCLOPRAMIDE HCL 5 MG/5ML PO SOLN
5.0000 mg | Freq: Three times a day (TID) | ORAL | Status: DC
  Filled 2017-07-30 (×2): qty 5

## 2017-07-30 MED ORDER — NITROGLYCERIN 0.4 MG SL SUBL: 0.4000 mg | SUBLINGUAL_TABLET | SUBLINGUAL | Status: DC | PRN

## 2017-07-30 NOTE — Progress Notes (Signed)
Inpatient Diabetes Program Recommendations  AACE/ADA: New Consensus Statement on Inpatient Glycemic Control (2015)  Target Ranges:  Prepandial:   less than 140 mg/dL      Peak postprandial:   less than 180 mg/dL (1-2 hours)      Critically ill patients:  140 - 180 mg/dL   Review of Glycemic Control  Diabetes history: DM 2 Outpatient Diabetes medications: None noted on med rec Current orders for Inpatient glycemic control: Lantus 10 units, Novolog Moderate + HS scale  Inpatient Diabetes Program Recommendations:    A1c this admission 10% on 1/28. Noted patient is not taking any medications for DM at this time at home. Would possibly consider Metformin at home. Will see patient to inquire about lifestyle management of DM.  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 Spoke with patient about his A1c level, 10% on 1/28. Patient reports liking regular coke.  Discussed importance of checking CBGs and maintaining good CBG control to prevent long-term and short-term complications. Explained how hyperglycemia leads to damage within blood vessels which lead to the common complications seen with uncontrolled diabetes. Discussed impact of nutrition, exercise, stress, sickness, and medications on diabetes control. Discussed carbohydrates, carbohydrate goals per day and meal, along with portion sizes. Encouraged patient to check glucose at least 2 times per day.  Thanks,  Tama Headings RN, MSN, [facility] Inpatient Diabetes Coordinator Team Pager [phone] (8a-5p)

## 2017-07-30 NOTE — Progress Notes (Signed)
pulmonayr came to round on him but he is not in room 6e27. Will reasesss 07/31/17   Dr. Brand Males, M.D., F.C.C.P Pulmonary and Critical Care Medicine Staff Physician, [place] Director - Interstitial Lung Disease  Program  [facility] [place] [facility] [place], [place], [address]  Pager: [phone], If no answer or between  15:00h - 7:00h: call [phone] Telephone: [phone]
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
## 2017-07-30 NOTE — Progress Notes (Signed)
PROGRESS NOTE  [name]:[id number] DOB: [birth year] DOA: 07/27/2017 PCP: Vonna Drafts, FNP  HPI/Recap of past 36 hours: 84 year old male history of hypertension, diabetes, hyperlipidemia, diastolic CHF, presents to the ER after being instructed by his urologist to come due to history of worsening dyspnea, on exertion, pleuritic chest pain, left calf tenderness for the past couple of days.  Patient reported DOE and chest pain has been ongoing for couple of years.  Patient has been seen intermittently for chest pain and he was described as chest wall pain, no further workup was done.  In the ED, patient noted to desaturate to the 80s on ambulation. CT angio was negative for PE, but was concerning for chronic interstitial changes/diffuse parenchymal lung disease as well as patulous esophagus. ED trop was negative, EKG no acute ST changes. PCCM consulted.  Patient admitted for further management.  Today, patient reported feeling slightly better, reported chest pain relieved by diclofenac gel.  Denies any worsening chest pain, worsening shortness of breath, diaphoresis, fever/chills, dizziness, nausea/vomiting.  Assessment/Plan: Principal Problem:   Chest pain Active Problems:   DM (diabetes mellitus), type 2, uncontrolled with complications (HCC)   HTN (hypertension), benign  Atypical chest pain likely musculoskeletal Currently present even at rest, TTP, pleuritic, chronic ??GERD/dilated esophagus shown on CT Trop X 3 neg, EKG no acute ST changes ECHO showed: EF 55-60%, Grade 1DD Nuclear stress test showed: Limited study secondary to heterogeneous myocardial activity likely motion. Difficult to exclude reversibility in the distal inferior wall. Mild global hypokinesis, similar to previous study. LVEF 42%.  Same as previous study. Non invasive risk stratification: Moderate   Started a trial of PPI BID, Reglan solution, GI cocktail prn Cardiology consulted, appreciate recs  DOE  likely due to diffuse parenchymal lung disease Chronic DOE, progressively getting worse CT showed: CT chest images reviewed showing upper lobe predominant pulmonary fibrosis chagnes, specifically areas of honey combing and reticulation; the changes are peripheral based and worse in the upper lobes; esophagus is patulous  DDx: Chronic hypersensitivity pneumonitis Vs UIP Vs connective tissue disease Maintain on supplemental O2 as pt desaturates on ambulation PCCM on board: Pneumonitis/ILD panel, so far neg. ANA, ESR, CRP, CCP, RF, SSA/SSB, SCL 70, centromere, Jo-1, Aldolase. Would need outpt PFT, HRCT  Esophageal dysmotility  Dilated esophagus seen on CT angio chest Barium swallow showed mildly dilated esophagus with moderate esophageal dysmotility. No fixed esophageal narrowing or stricture May consult GI in the am Continue PPI, PO metoclopromide  Hypertension Controlled Continue home losartan, held home hydralazine  DM type 2 Uncontrolled, A1c is 10 SSI, lantus  HLD LDL is 150 Started lipitor 80 mg daily   Code Status: Full code  Family Communication: None at bedside  Disposition Plan: Home once stable   Consultants:  PCCM  Cardiology  Procedures:  Nuclear stress test on 07/30/17  Antimicrobials:  None  DVT prophylaxis:  Lovenox   Objective: Vitals:   07/30/17 1306 07/30/17 1308 07/30/17 1309 07/30/17 1555  BP: 124/86 125/89 (!) 136/91 114/61  Pulse:    93  Resp:    14  Temp:    98.3 F (36.8 C)  TempSrc:    Oral  SpO2:    95%  Weight:      Height:        Intake/Output Summary (Last 24 hours) at 07/30/2017 1803 Last data filed at 07/30/2017 1750 Gross per 24 hour  Intake 720 ml  Output 700 ml  Net 20 ml   [name]  Weights   07/28/17 1428 07/29/17 0520 07/30/17 0449  Weight: 115 kg (253 lb 8.5 oz) 111.7 kg (246 lb 4.8 oz) 111.2 kg (245 lb 3.2 oz)    Exam:   General: Alert, awake, oriented, NAD  Cardiovascular: S1, S2 present, no added heart  sounds  Respiratory: Fine crackles noted bilaterally  Abdomen: Soft, nontender, nondistended, bowel sounds present  Musculoskeletal: No pedal edema bilaterally  Skin: Normal  Psychiatry: Normal   Data Reviewed: CBC: Recent Labs  Lab 07/27/17 1934 07/28/17 0626 07/29/17 0738  WBC 7.9 8.2 6.4  HGB 16.2 16.3 15.8  HCT 48.2 48.7 48.0  MCV 93.6 93.8 94.1  PLT 185 179 161   Basic Metabolic Panel: Recent Labs  Lab 07/27/17 1449 07/29/17 0738  NA 137 138  K 4.7 4.0  CL 103 100*  CO2 23 29  GLUCOSE 167* 180*  BUN 10 10  CREATININE 1.16 1.19  CALCIUM 9.0 8.7*   GFR: Estimated Creatinine Clearance: 56.7 mL/min (by C-G formula based on SCr of 1.19 mg/dL). Liver Function Tests: No results for input(s): AST, ALT, ALKPHOS, BILITOT, PROT, ALBUMIN in the last 168 hours. No results for input(s): LIPASE, AMYLASE in the last 168 hours. No results for input(s): AMMONIA in the last 168 hours. Coagulation Profile: No results for input(s): INR, PROTIME in the last 168 hours. Cardiac Enzymes: Recent Labs  Lab 07/28/17 0626 07/28/17 1520  TROPONINI <0.03 <0.03   BNP (last 3 results) No results for input(s): PROBNP in the last 8760 hours. HbA1C: Recent Labs    07/30/17 0610  HGBA1C 10.0*   CBG: Recent Labs  Lab 07/29/17 1549 07/29/17 2051 07/30/17 0741 07/30/17 1104 07/30/17 1551  GLUCAP 255* 199* 195* 216* 276*   Lipid Profile: Recent Labs    07/30/17 0610  CHOL 223*  HDL 45  LDLCALC 150*  TRIG 139  CHOLHDL 5.0   Thyroid Function Tests: No results for input(s): TSH, T4TOTAL, FREET4, T3FREE, THYROIDAB in the last 72 hours. Anemia Panel: No results for input(s): VITAMINB12, FOLATE, FERRITIN, TIBC, IRON, RETICCTPCT in the last 72 hours. Urine analysis:    Component Value Date/Time   COLORURINE YELLOW 06/10/2017 [place] 06/10/2017 1208   LABSPEC 1.020 06/10/2017 1208   PHURINE 7.0 06/10/2017 1208   GLUCOSEU 50 (A) 06/10/2017 1208    HGBUR LARGE (A) 06/10/2017 1208   BILIRUBINUR NEGATIVE 06/10/2017 1208   KETONESUR 5 (A) 06/10/2017 1208   PROTEINUR 100 (A) 06/10/2017 1208   UROBILINOGEN 0.2 03/23/2014 1805   NITRITE NEGATIVE 06/10/2017 1208   LEUKOCYTESUR SMALL (A) 06/10/2017 1208   Sepsis Labs: _0 (procalcitonin:4,lacticidven:4)  ) Recent Results (from the past 240 hour(s))  MRSA PCR Screening     Status: None   Collection Time: 07/28/17  6:00 PM  Result Value Ref Range Status   MRSA by PCR NEGATIVE NEGATIVE Final    Comment:        The GeneXpert MRSA Assay (FDA approved for NASAL specimens only), is one component of a comprehensive MRSA colonization surveillance program. It is not intended to diagnose MRSA infection nor to guide or monitor treatment for MRSA infections.       Studies: Dg Esophagus  Result Date: 07/30/2017 CLINICAL DATA:  Chest pain, patulous esophagus on CT EXAM: ESOPHOGRAM/BARIUM SWALLOW TECHNIQUE: Single contrast examination was performed using  thin barium. FLUOROSCOPY TIME:  Fluoroscopy Time:  42 seconds Radiation Exposure Index (if provided by the fluoroscopic device): 24.3 mGy Number of Acquired Spot Images: 6 COMPARISON:  None.  FINDINGS: Given patient's condition, limited evaluation was performed in semi recumbent supine position. Mildly dilated, fluid-filled esophagus. Moderate esophageal dysmotility. No fixed esophageal narrowing or stricture. A barium tablet was not administered given dysmotility. Patient was not assessed for gastroesophageal reflux. IMPRESSION: Mildly dilated esophagus with moderate esophageal dysmotility. No fixed esophageal narrowing or stricture. Electronically Signed   By: Julian Hy M.D.   On: 07/30/2017 08:37   Nm Myocar Multi W/spect W/wall Motion / Ef  Result Date: 07/30/2017 CLINICAL DATA:  84 year old with pleuritic chest pain. History of hypertension, diastolic congestive heart failure and diabetes. Former smoker. EXAM: MYOCARDIAL  IMAGING WITH SPECT (REST AND PHARMACOLOGIC-STRESS) GATED LEFT VENTRICULAR WALL MOTION STUDY LEFT VENTRICULAR EJECTION FRACTION TECHNIQUE: Standard myocardial SPECT imaging was performed after resting intravenous injection of 10 mCi Tc-13mtetrofosmin. Subsequently, intravenous infusion of Lexiscan was performed under the supervision of the Cardiology staff. At peak effect of the drug, 30 mCi Tc-980metrofosmin was injected intravenously and standard myocardial SPECT imaging was performed. Quantitative gated imaging was also performed to evaluate left ventricular wall motion, and estimate left ventricular ejection fraction. COMPARISON:  Chest CTA 07/28/2017. Nuclear medicine myocardial scan 08/30/2013. FINDINGS: Perfusion: Heterogeneous myocardial activity, likely in part secondary to motion. There are areas of decreased activity at the apex and in the distal septum, inferior and lateral walls. Overall activity appears slightly worse on the rest images, although mild reversibility in the distal inferior wall difficult to exclude. The anterior wall activity appears normal. Wall Motion: Suboptimally evaluated due to technical limitations. No focal wall motion abnormality identified. Left Ventricular Ejection Fraction: 42 % End diastolic volume 63 ml End systolic volume 37 ml IMPRESSION: 1. Limited study secondary to heterogeneous myocardial activity. This may be secondary to patient motion. Difficult to exclude reversibility in the distal inferior wall. 2. Mild global hypokinesis, similar to previous study. 3. Left ventricular ejection fraction 42%.  Same as previous study. 4. Non invasive risk stratification*: Moderate *2012 Appropriate Use Criteria for Coronary Revascularization Focused Update: J Am Coll Cardiol. 205697;94(8):016-553http://content.[email or web address]?[email or web address] Electronically Signed   By: WiRichardean Sale.D.   On: 07/30/2017 14:27    Scheduled Meds: . atorvastatin  80 mg Oral  q1800  . clotrimazole   Topical BID  . diclofenac sodium  2 g Topical QID  . enoxaparin (LOVENOX) injection  40 mg Subcutaneous Q24H  . furosemide  40 mg Oral Daily  . insulin aspart  0-15 Units Subcutaneous TID WC  . insulin aspart  0-5 Units Subcutaneous QHS  . insulin glargine  10 Units Subcutaneous QHS  . ipratropium-albuterol  3 mL Nebulization BID  . living well with diabetes book   Does not apply Once  . losartan  50 mg Oral Daily  . metoCLOPramide  5 mg Oral TID WC & HS  . pantoprazole  40 mg Oral BID AC  . regadenoson      . triamcinolone cream  1 application Topical BID    Continuous Infusions:   LOS: 1 day     NkAlma FriendlyMD [facility]   If 7PM-7AM, please contact night-coverage [email or web address] Password [email or web address]/28/[email or web address], 6:03 PM

## 2017-07-31 ENCOUNTER — Telehealth: Payer: Self-pay | Admitting: Internal Medicine

## 2017-07-31 DIAGNOSIS — J849 Interstitial pulmonary disease, unspecified: Secondary | ICD-10-CM

## 2017-07-31 LAB — BASIC METABOLIC PANEL
Anion gap: 8 (ref 5–15)
BUN: 12 mg/dL (ref 6–20)
CALCIUM: 8.4 mg/dL — AB (ref 8.9–10.3)
CO2: 27 mmol/L (ref 22–32)
Chloride: 102 mmol/L (ref 101–111)
Creatinine, Ser: 1.14 mg/dL (ref 0.61–1.24)
GFR calc Af Amer: >60 mL/min (ref >60)
GFR, EST NON AFRICAN AMERICAN: 57 mL/min — AB (ref >60)
GLUCOSE: 275 mg/dL — AB (ref 65–99)
Potassium: 4.1 mmol/L (ref 3.5–5.1)
Sodium: 137 mmol/L (ref 135–145)

## 2017-07-31 LAB — GLUCOSE, CAPILLARY
Glucose-Capillary: 217 mg/dL — ABNORMAL HIGH (ref 65–99)
Glucose-Capillary: 233 mg/dL — ABNORMAL HIGH (ref 65–99)
Glucose-Capillary: 248 mg/dL — ABNORMAL HIGH (ref 65–99)
Glucose-Capillary: 307 mg/dL — ABNORMAL HIGH (ref 65–99)
Glucose-Capillary: 320 mg/dL — ABNORMAL HIGH (ref 65–99)

## 2017-07-31 MED ORDER — INSULIN GLARGINE 100 UNIT/ML ~~LOC~~ SOLN
15.0000 [IU] | Freq: Every day | SUBCUTANEOUS | Status: DC
  Administered 2017-07-31: 15 [IU] via SUBCUTANEOUS
  Filled 2017-07-31 (×2): qty 0.15

## 2017-07-31 NOTE — Telephone Encounter (Signed)
Gwynn Burly will follow with Dr Lake Bells in March for chronic ILD Please ensure   HRCT supine and prone  And   Pre-bd spiro and dlco only. No lung volume or bd response. No post-bd spiro  Before seeing Dr Lake Bells - appt is in march    Dr. Brand Males, M.D., [facility]C.P Pulmonary and Critical Care Medicine Staff Physician, [place] Director - Interstitial Lung Disease  Program  Pulmonary [place] [facility] [place], [place], [address]  Pager: [phone], If no answer or between  15:00h - 7:00h: call [phone] Telephone: [phone]

## 2017-07-31 NOTE — Progress Notes (Signed)
SATURATION QUALIFICATIONS: (This note is used to comply with regulatory documentation for home oxygen)  Patient Saturations on Room Air at Rest = 100%  Patient Saturations on Room Air while Ambulating = 86%  Patient Saturations on 2 Liters of oxygen while Ambulating = 89%  Patient Saturations on 3 Liters of oxygen while Ambulating = 92%  :

## 2017-07-31 NOTE — Consult Note (Signed)
Ref: Vonna Drafts, FNP   Subjective:  No chest pain. Discussed care with pulmonary. Patient may benefit from oxygen use at home as he desaturates on activity with chronic ILD.   Objective:  Vital Signs in the last 24 hours: Temp:  [97.5 F (36.4 C)-98.5 F (36.9 C)] 98 F (36.7 C) (01/29 0749) Pulse Rate:  [78-95] 81 (01/29 0749) Cardiac Rhythm: Normal sinus rhythm;Heart block (01/29 0746) Resp:  [14-27] 15 (01/29 0526) BP: (93-136)/(61-91) 109/73 (01/29 0749) SpO2:  [93 %-100 %] 98 % (01/29 0856) FiO2 (%):  [2 %] 2 % (01/28 1555) Weight:  [112.1 kg (247 lb 3.2 oz)] 112.1 kg (247 lb 3.2 oz) (01/29 0526)  Physical Exam: BP Readings from Last 1 Encounters:  07/31/17 109/73     Wt Readings from Last 1 Encounters:  07/31/17 112.1 kg (247 lb 3.2 oz)    Weight change: 0.907 kg (2 lb) Body mass index is 35.47 kg/m. HEENT: [place]/AT, Eyes-Brown, PERL, EOMI, Conjunctiva-Pink, Sclera-Non-icteric Neck: No JVD, No bruit, Trachea midline. Lungs:  Fine crackles at bases, Bilateral. Cardiac:  Regular rhythm, normal S1 and S2, no S3. II/VI systolic murmur. Abdomen:  Soft, non-tender. BS present. Extremities:  No edema present. No cyanosis. No clubbing. CNS: AxOx3, Cranial nerves grossly intact, moves all 4 extremities.  Skin: Warm and dry.   Intake/Output from previous day: 01/28 0701 - 01/29 0700 In: 1080 [P.O.:1080] Out: 500 [Urine:500]    Lab Results: BMET    Component Value Date/Time   NA 137 07/31/2017 0437   NA 138 07/29/2017 0738   NA 137 07/27/2017 1449   K 4.1 07/31/2017 0437   K 4.0 07/29/2017 0738   K 4.7 07/27/2017 1449   CL 102 07/31/2017 0437   CL 100 (L) 07/29/2017 0738   CL 103 07/27/2017 1449   CO2 27 07/31/2017 0437   CO2 29 07/29/2017 0738   CO2 23 07/27/2017 1449   GLUCOSE 275 (H) 07/31/2017 0437   GLUCOSE 180 (H) 07/29/2017 0738   GLUCOSE 167 (H) 07/27/2017 1449   BUN 12 07/31/2017 0437   BUN 10 07/29/2017 0738   BUN 10 07/27/2017 1449    CREATININE 1.14 07/31/2017 0437   CREATININE 1.19 07/29/2017 0738   CREATININE 1.16 07/27/2017 1449   CREATININE 1.04 05/28/2013 1127   CALCIUM 8.4 (L) 07/31/2017 0437   CALCIUM 8.7 (L) 07/29/2017 0738   CALCIUM 9.0 07/27/2017 1449   GFRNONAA 57 (L) 07/31/2017 0437   GFRNONAA 54 (L) 07/29/2017 0738   GFRNONAA 56 (L) 07/27/2017 1449   GFRNONAA 67 05/28/2013 1127   GFRAA >60 07/31/2017 0437   GFRAA >60 07/29/2017 0738   GFRAA >60 07/27/2017 1449   GFRAA 77 05/28/2013 1127   CBC    Component Value Date/Time   WBC 6.4 07/29/2017 0738   RBC 5.10 07/29/2017 0738   HGB 15.8 07/29/2017 0738   HCT 48.0 07/29/2017 0738   PLT 176 07/29/2017 0738   MCV 94.1 07/29/2017 0738   MCH 31.0 07/29/2017 0738   MCHC 32.9 07/29/2017 0738   RDW 14.4 07/29/2017 0738   LYMPHSABS 2.3 06/10/2017 1345   MONOABS 0.4 06/10/2017 1345   EOSABS 0.1 06/10/2017 1345   BASOSABS 0.0 06/10/2017 1345   HEPATIC Function Panel Recent Labs    06/10/17 1345  PROT 7.1   HEMOGLOBIN A1C No components found for: HGA1C,  MPG CARDIAC ENZYMES Lab Results  Component Value Date   CKTOTAL 89 01/01/2012   CKMB 2.2 01/01/2012   TROPONINI <0.03 07/28/2017  TROPONINI <0.03 07/28/2017   TROPONINI <0.30 03/23/2014   BNP No results for input(s): PROBNP in the last 8760 hours. TSH No results for input(s): TSH in the last 8760 hours. CHOLESTEROL Recent Labs    07/30/17 0610  CHOL 223*    Scheduled Meds: . atorvastatin  80 mg Oral q1800  . clotrimazole   Topical BID  . diclofenac sodium  2 g Topical QID  . enoxaparin (LOVENOX) injection  40 mg Subcutaneous Q24H  . furosemide  40 mg Oral Daily  . insulin aspart  0-15 Units Subcutaneous TID WC  . insulin aspart  0-5 Units Subcutaneous QHS  . insulin glargine  10 Units Subcutaneous QHS  . ipratropium-albuterol  3 mL Nebulization BID  . living well with diabetes book   Does not apply Once  . losartan  50 mg Oral Daily  . metoCLOPramide  5 mg Oral TID WC & HS   . pantoprazole  40 mg Oral BID AC  . triamcinolone cream  1 application Topical BID   Continuous Infusions: PRN Meds:.acetaminophen, gi cocktail, HYDROcodone-acetaminophen, morphine injection, nitroGLYCERIN, ondansetron (ZOFRAN) IV  Assessment/Plan: Chest pain Chronic ILD Esophageal dysmotility Hypertension Type 2 DM Hyperlipidemia Esophageal dysmotility  Supplemental oxygen at home. Continue current treatment   LOS: 2 days    Dixie Dials  MD  07/31/2017, 9:17 AM

## 2017-07-31 NOTE — Progress Notes (Signed)
PROGRESS NOTE  [name] ZOX:[id number] DOB: [birth year] DOA: 07/27/2017 PCP: Vonna Drafts, FNP  HPI/Recap of past 95 hours: 84 year old male history of hypertension, diabetes, hyperlipidemia, diastolic CHF, presents to the ER after being instructed by his urologist to come due to history of worsening dyspnea, on exertion, pleuritic chest pain, left calf tenderness for the past couple of days.  Patient reported DOE and chest pain has been ongoing for couple of years.  Patient has been seen intermittently for chest pain and he was described as chest wall pain, no further workup was done.  In the ED, patient noted to desaturate to the 80s on ambulation. CT angio was negative for PE, but was concerning for chronic interstitial changes/diffuse parenchymal lung disease as well as patulous esophagus. ED trop was negative, EKG no acute ST changes. PCCM consulted.  Patient admitted for further management.  Today, patient continues to feel better.  Denies any further chest pain, worsening shortness of breath, fever/chills, dizziness.  Assessment/Plan: Principal Problem:   Chest pain Active Problems:   DM (diabetes mellitus), type 2, uncontrolled with complications (HCC)   HTN (hypertension), benign   ILD (interstitial lung disease) ([place])  Atypical chest pain likely musculoskeletal Currently present even at rest, TTP, pleuritic, chronic ??GERD/dilated esophagus shown on CT Trop X 3 neg, EKG no acute ST changes ECHO showed: EF 55-60%, Grade 1DD Nuclear stress test showed: Limited study secondary to heterogeneous myocardial activity likely motion. Difficult to exclude reversibility in the distal inferior wall. Mild global hypokinesis, similar to previous study. LVEF 42%.  Same as previous study. Non invasive risk stratification: Moderate   Started a trial of PPI BID, Reglan solution, GI cocktail prn, may D/C on discharge as GI not recommending Pt reported diclofenac gel topical helps with  chest pain Cardiology consulted, appreciate recs  DOE likely due to diffuse parenchymal lung disease Chronic DOE, progressively getting worse CT showed: CT chest images reviewed showing upper lobe predominant pulmonary fibrosis chagnes, specifically areas of honey combing and reticulation; the changes are peripheral based and worse in the upper lobes; esophagus is patulous  DDx: Chronic hypersensitivity pneumonitis Vs UIP Vs connective tissue disease Maintain on supplemental O2 as pt desaturates on ambulation PCCM on board: Pneumonitis/ILD panel, so far neg. ANA, ESR, CRP, CCP, RF, SSA/SSB, SCL 70, centromere, Jo-1, Aldolase. Would need outpt PFT, HRCT Pt will require home O2 on ambulation upon d/c. Please refer to PT note for specifics.  Esophageal dysmotility  Dilated esophagus seen on CT angio chest Barium swallow showed mildly dilated esophagus with moderate esophageal dysmotility. No fixed esophageal narrowing or stricture GI consulted, no further work up Continue PPI, PO metoclopromide, may d/c on discharge as GI not recommending   Hypertension Controlled Continue home losartan, held home hydralazine  DM type 2 Uncontrolled, A1c is 10 SSI, lantus  HLD LDL is 150 Started lipitor 80 mg daily   Code Status: Full code  Family Communication: None at bedside  Disposition Plan: Home once stable, likely 08/01/17 with home O2   Consultants:  PCCM  Cardiology  GI  Procedures:  Nuclear stress test on 07/30/17  Antimicrobials:  None  DVT prophylaxis:  Lovenox   Objective: Vitals:   07/31/17 1019 07/31/17 1020 07/31/17 1141 07/31/17 1641  BP: 113/67  125/77 (!) 116/93  Pulse: 92 89 81 85  Resp: (!) _0 Temp:   98.2 F (36.8 C)   TempSrc:   Oral Oral  SpO2: 98% 98% 100% 96%  Weight:      Height:        Intake/Output Summary (Last 24 hours) at 07/31/2017 1943 Last data filed at 07/31/2017 1849 Gross per 24 hour  Intake 960 ml  Output 1150 ml    Net -190 ml   Filed Weights   07/29/17 0520 07/30/17 0449 07/31/17 0526  Weight: 111.7 kg (246 lb 4.8 oz) 111.2 kg (245 lb 3.2 oz) 112.1 kg (247 lb 3.2 oz)    Exam:   General: Alert, awake, oriented, NAD  Cardiovascular: S1, S2 present, no added heart sounds  Respiratory: Fine inspiratory crackles noted bilaterally  Abdomen: Soft, nontender, nondistended, bowel sounds present  Musculoskeletal: No pedal edema bilaterally  Skin: Normal  Psychiatry: Normal   Data Reviewed: CBC: Recent Labs  Lab 07/27/17 1934 07/28/17 0626 07/29/17 0738  WBC 7.9 8.2 6.4  HGB 16.2 16.3 15.8  HCT 48.2 48.7 48.0  MCV 93.6 93.8 94.1  PLT 185 179 176   Basic Metabolic Panel: Recent Labs  Lab 07/27/17 1449 07/29/17 0738 07/31/17 0437  NA 137 138 137  K 4.7 4.0 4.1  CL 103 100* 102  CO2 23 29 27  GLUCOSE 167* 180* 275*  BUN 10 10 12  CREATININE 1.16 1.19 1.14  CALCIUM 9.0 8.7* 8.4*   GFR: Estimated Creatinine Clearance: 59.4 mL/min (by C-G formula based on SCr of 1.14 mg/dL). Liver Function Tests: No results for input(s): AST, ALT, ALKPHOS, BILITOT, PROT, ALBUMIN in the last 168 hours. No results for input(s): LIPASE, AMYLASE in the last 168 hours. No results for input(s): AMMONIA in the last 168 hours. Coagulation Profile: No results for input(s): INR, PROTIME in the last 168 hours. Cardiac Enzymes: Recent Labs  Lab 07/28/17 0626 07/28/17 1520  TROPONINI <0.03 <0.03   BNP (last 3 results) No results for input(s): PROBNP in the last 8760 hours. HbA1C: Recent Labs    07/30/17 0610  HGBA1C 10.0*   CBG: Recent Labs  Lab 07/30/17 1551 07/30/17 2008 07/31/17 0747 07/31/17 1142 07/31/17 1640  GLUCAP 276* 217* 217* 233* 307*   Lipid Profile: Recent Labs    07/30/17 0610  CHOL 223*  HDL 45  LDLCALC 150*  TRIG 139  CHOLHDL 5.0   Thyroid Function Tests: No results for input(s): TSH, T4TOTAL, FREET4, T3FREE, THYROIDAB in the last 72 hours. Anemia  Panel: No results for input(s): VITAMINB12, FOLATE, FERRITIN, TIBC, IRON, RETICCTPCT in the last 72 hours. Urine analysis:    Component Value Date/Time   COLORURINE YELLOW 06/10/2017 1208   APPEARANCEUR CLEAR 06/10/2017 1208   LABSPEC 1.020 06/10/2017 1208   PHURINE 7.0 06/10/2017 1208   GLUCOSEU 50 (A) 06/10/2017 1208   HGBUR LARGE (A) 06/10/2017 1208   BILIRUBINUR NEGATIVE 06/10/2017 1208   KETONESUR 5 (A) 06/10/2017 1208   PROTEINUR 100 (A) 06/10/2017 1208   UROBILINOGEN 0.2 03/23/2014 1805   NITRITE NEGATIVE 06/10/2017 1208   LEUKOCYTESUR SMALL (A) 06/10/2017 1208   Sepsis Labs: @LABRCNTIP(procalcitonin:4,lacticidven:4)  ) Recent Results (from the past 240 hour(s))  MRSA PCR Screening     Status: None   Collection Time: 07/28/17  6:00 PM  Result Value Ref Range Status   MRSA by PCR NEGATIVE NEGATIVE Final    Comment:        The GeneXpert MRSA Assay (FDA approved for NASAL specimens only), is one component of a comprehensive MRSA colonization surveillance program. It is not intended to diagnose MRSA infection nor to guide or monitor treatment for MRSA infections.         Studies: No results found.  Scheduled Meds: . atorvastatin  80 mg Oral q1800  . clotrimazole   Topical BID  . diclofenac sodium  2 g Topical QID  . enoxaparin (LOVENOX) injection  40 mg Subcutaneous Q24H  . furosemide  40 mg Oral Daily  . insulin aspart  0-15 Units Subcutaneous TID WC  . insulin aspart  0-5 Units Subcutaneous QHS  . insulin glargine  15 Units Subcutaneous QHS  . ipratropium-albuterol  3 mL Nebulization BID  . living well with diabetes book   Does not apply Once  . losartan  50 mg Oral Daily  . metoCLOPramide  5 mg Oral TID WC & HS  . pantoprazole  40 mg Oral BID AC  . triamcinolone cream  1 application Topical BID    Continuous Infusions:   LOS: 2 days     Alma Friendly, MD [facility]   If 7PM-7AM, please contact  night-coverage [email or web address] Password [facility] 07/31/2017, 7:43 PM

## 2017-07-31 NOTE — Evaluation (Signed)
Physical Therapy Evaluation Patient Details Name: [name] MRN: [id number] DOB: September 30, 1929 Today's Date: 07/31/2017   History of Present Illness  pt is an 84 y/o male with pmh significant for HTN, DM, and dCHF, admitted to the ED as instructed by pt's urologist for worsening dyspnea on exertion, pleuritic CP and left calf tenderness for a couple of days.  CT negative for PE, but concerning for chronic interstitial/diffuse parenchymal lung disease.  Clinical Impression  Pt admitted with/for worsening dyspnea on exertion and other complications.  Pt at S to I level for gait and mobility, respectively and I in a home environment..  Pt currently limited functionally due to the problems listed below.  (see problems list.)  Pt will benefit from PT to maximize function and safety to be able to get home safely with available assist.     Follow Up Recommendations No PT follow up;Other (comment)(pulmonary rehab)    Equipment Recommendations  None recommended by PT    Recommendations for Other Services       Precautions / Restrictions Precautions Precaution Comments: watch O2 sats      Mobility  Bed Mobility Overal bed mobility: Independent                Transfers Overall transfer level: Independent                  Ambulation/Gait Ambulation/Gait assistance: Supervision Ambulation Distance (Feet): 300 Feet Assistive device: None Gait Pattern/deviations: Step-through pattern   Gait velocity interpretation: at or above normal speed for age/gender General Gait Details: steady throughout.  Ambulating on RA, sats dropped to 86% at 117 bpm .  With addition of O2 at 2L sats rose to 89% on 3L to 90%. Sitting to rest on 3L sats rose to 98%, RA at 95%.  Stairs            Wheelchair Mobility    Modified Rankin (Stroke Patients Only)       Balance Overall balance assessment: No apparent balance deficits (not formally assessed)                                            Pertinent Vitals/Pain      Home Living Family/patient expects to be discharged to:: Private residence Living Arrangements: Other relatives;Children Available Help at Discharge: Family;Available 24 hours/day Type of Home: House Home Access: Stairs to enter     Home Layout: One level Home Equipment: Kasandra Knudsen - single point      Prior Function Level of Independence: Independent               Hand Dominance        Extremity/Trunk Assessment   Upper Extremity Assessment Upper Extremity Assessment: Defer to OT evaluation    Lower Extremity Assessment Lower Extremity Assessment: Overall WFL for tasks assessed       Communication   Communication: No difficulties  Cognition Arousal/Alertness: Awake/alert Behavior During Therapy: WFL for tasks assessed/performed Overall Cognitive Status: Within Functional Limits for tasks assessed                                        General Comments      Exercises     Assessment/Plan    PT Assessment Patient needs continued PT services  PT Problem List Decreased activity tolerance;Decreased mobility;Cardiopulmonary status limiting activity       PT Treatment Interventions Gait training;Stair training;Functional mobility training;Therapeutic activities;Patient/family education    PT Goals (Current goals can be found in the Care Plan section)  Acute Rehab PT Goals Patient Stated Goal: breathe better PT Goal Formulation: With patient Time For Goal Achievement: 08/07/17 Potential to Achieve Goals: Good    Frequency Min 3X/week   Barriers to discharge        Co-evaluation               AM-PAC PT "6 Clicks" Daily Activity  Outcome Measure Difficulty turning over in bed (including adjusting bedclothes, sheets and blankets)?: None Difficulty moving from lying on back to sitting on the side of the bed? : None Difficulty sitting down on and standing up from a chair with  arms (e.g., wheelchair, bedside commode, etc,.)?: None Help needed moving to and from a bed to chair (including a wheelchair)?: None Help needed walking in hospital room?: A Little Help needed climbing 3-5 steps with a railing? : None 6 Click Score: 23    End of Session Equipment Utilized During Treatment: Oxygen Activity Tolerance: Patient tolerated treatment well Patient left: in bed;with call bell/phone within reach;with bed alarm set;with family/visitor present Nurse Communication: Mobility status PT Visit Diagnosis: Other abnormalities of gait and mobility (R26.89)    Time: 2010-0712 PT Time Calculation (min) (ACUTE ONLY): 35 min   Charges:   PT Evaluation $PT Eval Moderate Complexity: 1 Mod PT Treatments $Gait Training: 8-22 mins   PT G Codes:        2017/08/02  Donnella Sham, PT [phone]  (pager)  Tessie Fass Terryl Molinelli 02-Aug-2017, 5:37 PM

## 2017-07-31 NOTE — Assessment & Plan Note (Signed)
-   Patulous  Esophagus will suggest scleroderma but no clinical evidence for this and scl-70 is negaive - Issue appears chronic on CT atleast since 2011 and on CXR since 2006; hard to discern if progressive. He is not aware of his ILD dx and denies dyspnea many years ago. For him this is  An issue only past few months - Autoimmune antibody - negative so far  - Ddx: UIP v Sarcoid Stage 4 (ude to slight UL predmoniance) v Chronic HP  Plan - await HP panel = check gbm, anca, ace - check ambulator pulse ox on RA for discharge - opd fu with Dr Lake Bells who did original consult in hospital; can be referred to ILD clinic depending on his followup eval of complexity - - Wil send message to his CMA AShley to set up HRCT and spirometry priot to appt with Dr Lake Bells

## 2017-07-31 NOTE — Consult Note (Signed)
Name: [name] MRN: [id number] DOB: 08-26-1929    ADMISSION DATE:  07/27/2017 CONSULTATION DATE:  1/26  REFERRING MD :  TRH  CHIEF COMPLAINT:  IPF  BRIEF PATIENT DESCRIPTION: 84yo male former smoker (quit 20 years ago) with hx HTN, CHF, DM presented 1/25 with 1 day hx dyspnea, DOE and pleuritic chest pain.  Was apparently significantly SOB at urologists office who directed him to ER.  Admitted by [facility] and w/u neg for PE (neg CTA chest and neg BLE dopplers) but CT chest was concerning for chronic interstitial changes and PCCM consulted. No known fam hx autoimmune disease . Was Engineer, manufacturing, drove garbage truck.    SIGNIFICANT EVENTS  CTA chest 1/26>>> neg for PE, Chronic interstitial fibrosis with honeycomb changes  Results for [name], [name] (MRN [id number]) as of 07/31/2017 08:44  Ref. Range 07/28/2017 15:21  ANA Ab, IFA Unknown Negative  Anti JO-1 Latest Ref Range: 0.0 - 0.9 AI <0.2  RA Latex Turbid. Latest Ref Range: 0.0 - 13.9 IU/mL 11.1  SSA (Ro) (ENA) Antibody, IgG Latest Ref Range: 0.0 - 0.9 AI <0.2  SSB (La) (ENA) Antibody, IgG Latest Ref Range: 0.0 - 0.9 AI <0.2  Scleroderma (Scl-70) (ENA) Antibody, IgG Latest Ref Range: 0.0 - 0.9 AI <0.2  Results for [name], [name] (MRN [id number]) as of 07/31/2017 08:44  Ref. Range 07/03/2012 00:37 08/11/2014 11:39 07/28/2017 15:21  Sed Rate Latest Ref Range: 0 - 16 mm/hr 18 (H) 9 22 (H)    SUBJECTIVE:   SUBJECTIVE/OVERNIGHT/INTERVAL HX 07/31/2017 - denies any knowledge of chronic ILD. Review of images shows cxr changes of ILD since 2006/2008 and on CT chest since 2011 atleast. Unclear if worse over time. Per Cards: on echo no evidence of Pulm Htn and ef is good. Patient denies prior dx of sarcoidosis (he is ethnic african [place]). He has been on RA here in bed    VITAL SIGNS: Temp:  [97.5 F (36.4 C)-98.5 F (36.9 C)] 98 F (36.7 C) (01/29 0749) Pulse Rate:  [78-95] 81 (01/29 0749) Resp:  [14-27] 15 (01/29 0526) BP:  (93-136)/(61-91) 109/73 (01/29 0749) SpO2:  [93 %-100 %] 98 % (01/29 0856) FiO2 (%):  [2 %] 2 % (01/28 1555) Weight:  [112.1 kg (247 lb 3.2 oz)] 112.1 kg (247 lb 3.2 oz) (01/29 0526)  PHYSICAL EXAMINATION: 3 General Appearance:    obes  Head:    Normocephalic, without obvious abnormality, atraumatic  Eyes:    PERRL - yes, conjunctiva/corneas - clear      Ears:    Normal external ear canals, both ears  Nose:   NG tube - no  Throat:  ETT TUBE - no , OG tube - no  Neck:   Supple,  No enlargement/tenderness/nodules     Lungs:     Scattered crackles  Chest wall:    No deformity  Heart:    S1 and S2 normal, no murmur, CVP - no.  Pressors - no  Abdomen:     Soft, no masses, no organomegaly  Genitalia:    Not done  Rectal:   not done  Extremities:   Extremities- inntact     Skin:   Intact in exposed areas . Sacral area - no decub     Neurologic:   Sedation - none -> RASS - +1 . Moves all 4s - yes. CAM-ICU - neg  . Orientation - x3+     PULMONARY No results for input(s): PHART, PCO2ART, PO2ART, HCO3, TCO2, O2SAT  in the last 168 hours.  Invalid input(s): PCO2, PO2  CBC Recent Labs  Lab 07/27/17 1934 07/28/17 0626 07/29/17 0738  HGB 16.2 16.3 15.8  HCT 48.2 48.7 48.0  WBC 7.9 8.2 6.4  PLT 185 179 176    COAGULATION No results for input(s): INR in the last 168 hours.  CARDIAC   Recent Labs  Lab 07/28/17 0626 07/28/17 1520  TROPONINI <0.03 <0.03   No results for input(s): PROBNP in the last 168 hours.   CHEMISTRY Recent Labs  Lab 07/27/17 1449 07/29/17 0738 07/31/17 0437  NA 137 138 137  K 4.7 4.0 4.1  CL 103 100* 102  CO2 23 29 27   GLUCOSE 167* 180* 275*  BUN 10 10 12   CREATININE 1.16 1.19 1.14  CALCIUM 9.0 8.7* 8.4*   Estimated Creatinine Clearance: 59.4 mL/min (by C-G formula based on SCr of 1.14 mg/dL).   LIVER No results for input(s): AST, ALT, ALKPHOS, BILITOT, PROT, ALBUMIN, INR in the last 168 hours.   INFECTIOUS No results for input(s):  LATICACIDVEN, PROCALCITON in the last 168 hours.   ENDOCRINE CBG (last 3)  Recent Labs    07/30/17 1551 07/30/17 2008 07/31/17 0747  GLUCAP 276* 217* 217*         IMAGING x48h  - image(s) personally visualized  -   highlighted in bold Dg Esophagus  Result Date: 07/30/2017 CLINICAL DATA:  Chest pain, patulous esophagus on CT EXAM: ESOPHOGRAM/BARIUM SWALLOW TECHNIQUE: Single contrast examination was performed using  thin barium. FLUOROSCOPY TIME:  Fluoroscopy Time:  42 seconds Radiation Exposure Index (if provided by the fluoroscopic device): 24.3 mGy Number of Acquired Spot Images: 6 COMPARISON:  None. FINDINGS: Given patient's condition, limited evaluation was performed in semi recumbent supine position. Mildly dilated, fluid-filled esophagus. Moderate esophageal dysmotility. No fixed esophageal narrowing or stricture. A barium tablet was not administered given dysmotility. Patient was not assessed for gastroesophageal reflux. IMPRESSION: Mildly dilated esophagus with moderate esophageal dysmotility. No fixed esophageal narrowing or stricture. Electronically Signed   By: Julian Hy M.D.   On: 07/30/2017 08:37   Nm Myocar Multi W/spect W/wall Motion / Ef  Result Date: 07/30/2017 CLINICAL DATA:  84 year old with pleuritic chest pain. History of hypertension, diastolic congestive heart failure and diabetes. Former smoker. EXAM: MYOCARDIAL IMAGING WITH SPECT (REST AND PHARMACOLOGIC-STRESS) GATED LEFT VENTRICULAR WALL MOTION STUDY LEFT VENTRICULAR EJECTION FRACTION TECHNIQUE: Standard myocardial SPECT imaging was performed after resting intravenous injection of 10 mCi Tc-23m tetrofosmin. Subsequently, intravenous infusion of Lexiscan was performed under the supervision of the Cardiology staff. At peak effect of the drug, 30 mCi Tc-63m tetrofosmin was injected intravenously and standard myocardial SPECT imaging was performed. Quantitative gated imaging was also performed to evaluate left  ventricular wall motion, and estimate left ventricular ejection fraction. COMPARISON:  Chest CTA 07/28/2017. Nuclear medicine myocardial scan 08/30/2013. FINDINGS: Perfusion: Heterogeneous myocardial activity, likely in part secondary to motion. There are areas of decreased activity at the apex and in the distal septum, inferior and lateral walls. Overall activity appears slightly worse on the rest images, although mild reversibility in the distal inferior wall difficult to exclude. The anterior wall activity appears normal. Wall Motion: Suboptimally evaluated due to technical limitations. No focal wall motion abnormality identified. Left Ventricular Ejection Fraction: 42 % End diastolic volume 63 ml End systolic volume 37 ml IMPRESSION: 1. Limited study secondary to heterogeneous myocardial activity. This may be secondary to patient motion. Difficult to exclude reversibility in the distal inferior wall. 2. Mild global  hypokinesis, similar to previous study. 3. Left ventricular ejection fraction 42%.  Same as previous study. 4. Non invasive risk stratification*: Moderate *2012 Appropriate Use Criteria for Coronary Revascularization Focused Update: J Am Coll Cardiol. 6578;46(9):629-528. http://content.airportbarriers.com.aspx?articleid=[email or web address] Electronically Signed   By: Richardean Sale M.D.   On: 07/30/2017 14:27        ASSESSMENT / PLAN:   ILD (interstitial lung disease) ([place]) - Patulous  Esophagus will suggest scleroderma but no clinical evidence for this and scl-70 is negaive - Issue appears chronic on CT atleast since 2011 and on CXR since 2006; hard to discern if progressive. He is not aware of his ILD dx and denies dyspnea many years ago. For him this is  An issue only past few months - Autoimmune antibody - negative so far  - Ddx: UIP v Sarcoid Stage 4 (ude to slight UL predmoniance) v Chronic HP  Plan - await HP panel = check gbm, anca, ace - check ambulator pulse ox on RA for  discharge - opd fu with Dr Lake Bells who did original consult in hospital; can be referred to ILD clinic depending on his followup eval of complexity - - Wil send message to his CMA AShley to set up HRCT and spirometry priot to appt with Dr Lake Bells   Future Appointments  Date Time Provider [place]  09/13/2017 11:30 AM Juanito Doom, MD [facility] None     Dr. Brand Males, M.D., [facility].C.P Pulmonary and Critical Care Medicine Staff Physician, [place] Director - Interstitial Lung Disease  Program  Pulmonary [place] [facility] [place], [place], [address]  Pager: [phone], If no answer or between  15:00h - 7:00h: call [phone] Telephone: [phone]

## 2017-07-31 NOTE — Progress Notes (Signed)
Inpatient Diabetes Program Recommendations  AACE/ADA: New Consensus Statement on Inpatient Glycemic Control (2015)  Target Ranges:  Prepandial:   less than 140 mg/dL      Peak postprandial:   less than 180 mg/dL (1-2 hours)      Critically ill patients:  140 - 180 mg/dL   Lab Results  Component Value Date   GLUCAP 217 (H) 07/31/2017   HGBA1C 10.0 (H) 07/30/2017   Review of Glycemic Control  Diabetes history: DM 2 Outpatient Diabetes medications: Patient not taking any Current orders for Inpatient glycemic control: Lantus 10 units, Novolog Moderate Correction 0-15 units tid + Novolog HS scale 0-5 units  Inpatient Diabetes Program Recommendations:    Glucose mostly in the 200 range. Please consider increasing Lantus to 15 units.  Thanks,  Tama Headings RN, MSN, [facility] Inpatient Diabetes Coordinator Team Pager [phone] (8a-5p)

## 2017-07-31 NOTE — Consult Note (Signed)
Reason for Consult: Abnormal esophagram Referring Physician: [facility]  Reola Calkins HPI: This is an 84 year old male admitted with complaints of chest pain.  He was complaining of chest pain and SOB at his urologist's office and subsequently he underwent further work up in the ER.  A CT angio was performed and it was negative for a PE, however, there was evidence of ILD.  During this work up his esophagus was noted to be dilated and an esophagram was performed.  It revealed a mildly dilated esophagus and moderate esophageal dysmotility.  A 13 mm barium tablet was not administered.  There was no evidence of any strictures.  Clinically he reports dysphagia with PO intake only when he is eating in a recumbent position.  Sitting up he will not have any problems with swallowing.  He denies any prior history of nausea or vomiting.  Past Medical History:  Diagnosis Date  . Arthritis   . Diabetes mellitus   . H/O hiatal hernia   . Hyperlipidemia   . Hypertension   . Neuropathy   . Obesity     Past Surgical History:  Procedure Laterality Date  . ABDOMINAL SURGERY     pt has abdominal scarring and states he had sx but not sure what kind  . BACK SURGERY    . CHOLECYSTECTOMY      Family History  Problem Relation Age of Onset  . Cancer Mother     Social History:  reports that he quit smoking about 20 years ago. His smoking use included cigarettes. he has never used smokeless tobacco. He reports that he does not drink alcohol or use drugs.  Allergies: No Known Allergies  Medications:  Scheduled: . atorvastatin  80 mg Oral q1800  . clotrimazole   Topical BID  . diclofenac sodium  2 g Topical QID  . enoxaparin (LOVENOX) injection  40 mg Subcutaneous Q24H  . furosemide  40 mg Oral Daily  . insulin aspart  0-15 Units Subcutaneous TID WC  . insulin aspart  0-5 Units Subcutaneous QHS  . insulin glargine  15 Units Subcutaneous QHS  . ipratropium-albuterol  3 mL Nebulization BID   . living well with diabetes book   Does not apply Once  . losartan  50 mg Oral Daily  . metoCLOPramide  5 mg Oral TID WC & HS  . pantoprazole  40 mg Oral BID AC  . triamcinolone cream  1 application Topical BID   Continuous:   Results for orders placed or performed during the hospital encounter of 07/27/17 (from the past 24 hour(s))  Glucose, capillary     Status: Abnormal   Collection Time: 07/30/17  8:08 PM  Result Value Ref Range   Glucose-Capillary 217 (H) 65 - 99 mg/dL  Basic metabolic panel     Status: Abnormal   Collection Time: 07/31/17  4:37 AM  Result Value Ref Range   Sodium 137 135 - 145 mmol/L   Potassium 4.1 3.5 - 5.1 mmol/L   Chloride 102 101 - 111 mmol/L   CO2 27 22 - 32 mmol/L   Glucose, Bld 275 (H) 65 - 99 mg/dL   BUN 12 6 - 20 mg/dL   Creatinine, Ser 1.14 0.61 - 1.24 mg/dL   Calcium 8.4 (L) 8.9 - 10.3 mg/dL   GFR calc non Af Amer 57 (L) >60 mL/min   GFR calc Af Amer >60 >60 mL/min   Anion gap 8 5 - 15  Glucose, capillary  Status: Abnormal   Collection Time: 07/31/17  7:47 AM  Result Value Ref Range   Glucose-Capillary 217 (H) 65 - 99 mg/dL  Glucose, capillary     Status: Abnormal   Collection Time: 07/31/17 11:42 AM  Result Value Ref Range   Glucose-Capillary 233 (H) 65 - 99 mg/dL     Dg Esophagus  Result Date: 07/30/2017 CLINICAL DATA:  Chest pain, patulous esophagus on CT EXAM: ESOPHOGRAM/BARIUM SWALLOW TECHNIQUE: Single contrast examination was performed using  thin barium. FLUOROSCOPY TIME:  Fluoroscopy Time:  42 seconds Radiation Exposure Index (if provided by the fluoroscopic device): 24.3 mGy Number of Acquired Spot Images: 6 COMPARISON:  None. FINDINGS: Given patient's condition, limited evaluation was performed in semi recumbent supine position. Mildly dilated, fluid-filled esophagus. Moderate esophageal dysmotility. No fixed esophageal narrowing or stricture. A barium tablet was not administered given dysmotility. Patient was not assessed for  gastroesophageal reflux. IMPRESSION: Mildly dilated esophagus with moderate esophageal dysmotility. No fixed esophageal narrowing or stricture. Electronically Signed   By: Julian Hy M.D.   On: 07/30/2017 08:37   Nm Myocar Multi W/spect W/wall Motion / Ef  Result Date: 07/30/2017 CLINICAL DATA:  84 year old with pleuritic chest pain. History of hypertension, diastolic congestive heart failure and diabetes. Former smoker. EXAM: MYOCARDIAL IMAGING WITH SPECT (REST AND PHARMACOLOGIC-STRESS) GATED LEFT VENTRICULAR WALL MOTION STUDY LEFT VENTRICULAR EJECTION FRACTION TECHNIQUE: Standard myocardial SPECT imaging was performed after resting intravenous injection of 10 mCi Tc-44m tetrofosmin. Subsequently, intravenous infusion of Lexiscan was performed under the supervision of the Cardiology staff. At peak effect of the drug, 30 mCi Tc-96m tetrofosmin was injected intravenously and standard myocardial SPECT imaging was performed. Quantitative gated imaging was also performed to evaluate left ventricular wall motion, and estimate left ventricular ejection fraction. COMPARISON:  Chest CTA 07/28/2017. Nuclear medicine myocardial scan 08/30/2013. FINDINGS: Perfusion: Heterogeneous myocardial activity, likely in part secondary to motion. There are areas of decreased activity at the apex and in the distal septum, inferior and lateral walls. Overall activity appears slightly worse on the rest images, although mild reversibility in the distal inferior wall difficult to exclude. The anterior wall activity appears normal. Wall Motion: Suboptimally evaluated due to technical limitations. No focal wall motion abnormality identified. Left Ventricular Ejection Fraction: 42 % End diastolic volume 63 ml End systolic volume 37 ml IMPRESSION: 1. Limited study secondary to heterogeneous myocardial activity. This may be secondary to patient motion. Difficult to exclude reversibility in the distal inferior wall. 2. Mild global  hypokinesis, similar to previous study. 3. Left ventricular ejection fraction 42%.  Same as previous study. 4. Non invasive risk stratification*: Moderate *2012 Appropriate Use Criteria for Coronary Revascularization Focused Update: J Am Coll Cardiol. 9629;52(8):413-244. http://content.[email or web address].aspx?articleid=[email or web address] Electronically Signed   By: Richardean Sale M.D.   On: 07/30/2017 14:27    ROS:  As stated above in the HPI otherwise negative.  Blood pressure 125/77, pulse 81, temperature 98.2 F (36.8 C), temperature source Oral, resp. rate 18, height 5\' 10"  (1.778 m), weight 112.1 kg (247 lb 3.2 oz), SpO2 100 %.    PE: Gen: NAD, Alert and Oriented HEENT:  [place]/AT, EOMI Neck: Supple, no LAD Lungs: CTA Bilaterally CV: RRR without M/G/R ABM: Soft, NTND, +BS Ext: No C/C/E  Assessment/Plan: 1) Esophageal dysmotility. 2) Interstitial lung disease.   The patient does not report any issues with dysphagia when he is sitting up.  The esophagram was negative for strictures.  Given the degree of his lung disease and the very minor dysphagia complaints  without objective evidence of stricture, no further evaluation is required.    Plan: 1) No further GI evaluation. 2) Continue with lung disease work up. 3) Signing off.  Severiano Utsey D 07/31/2017, 4:17 PM

## 2017-08-01 LAB — BASIC METABOLIC PANEL
ANION GAP: 12 (ref 5–15)
BUN: 9 mg/dL (ref 6–20)
CHLORIDE: 100 mmol/L — AB (ref 101–111)
CO2: 26 mmol/L (ref 22–32)
Calcium: 8.8 mg/dL — ABNORMAL LOW (ref 8.9–10.3)
Creatinine, Ser: 1.1 mg/dL (ref 0.61–1.24)
GFR calc Af Amer: >60 mL/min (ref >60)
GFR calc non Af Amer: 59 mL/min — ABNORMAL LOW (ref >60)
Glucose, Bld: 177 mg/dL — ABNORMAL HIGH (ref 65–99)
POTASSIUM: 4 mmol/L (ref 3.5–5.1)
Sodium: 138 mmol/L (ref 135–145)

## 2017-08-01 LAB — ANGIOTENSIN CONVERTING ENZYME: ANGIOTENSIN-CONVERTING ENZYME: 24 U/L (ref 14–82)

## 2017-08-01 LAB — GLUCOSE, CAPILLARY
GLUCOSE-CAPILLARY: 323 mg/dL — AB (ref 65–99)
Glucose-Capillary: 198 mg/dL — ABNORMAL HIGH (ref 65–99)

## 2017-08-01 LAB — HYPERSENSITIVITY PNEUMONITIS
A. Pullulans Abs: NEGATIVE
A.Fumigatus #1 Abs: NEGATIVE
MICROPOLYSPORA FAENI IGG: NEGATIVE
Pigeon Serum Abs: NEGATIVE
THERMOACT. SACCHARII: NEGATIVE
THERMOACTINOMYCES VULGARIS IGG: NEGATIVE

## 2017-08-01 LAB — ANTI-DNA ANTIBODY, DOUBLE-STRANDED: ds DNA Ab: 2 IU/mL (ref 0–9)

## 2017-08-01 LAB — MPO/PR-3 (ANCA) ANTIBODIES
ANCA Proteinase 3: <3.5 U/mL (ref 0.0–3.5)
Myeloperoxidase Abs: <9 U/mL (ref 0.0–9.0)

## 2017-08-01 LAB — GLOMERULAR BASEMENT MEMBRANE ANTIBODIES: GBM Ab: 3 units (ref 0–20)

## 2017-08-01 LAB — CYCLIC CITRUL PEPTIDE ANTIBODY, IGG/IGA: CCP Antibodies IgG/IgA: 6 units (ref 0–19)

## 2017-08-01 MED ORDER — INSULIN STARTER KIT- SYRINGES (ENGLISH)
1.0000 | Freq: Once | Status: DC
  Filled 2017-08-01: qty 1

## 2017-08-01 MED ORDER — ATORVASTATIN CALCIUM 80 MG PO TABS: 80.0000 mg | ORAL_TABLET | Freq: Every day | ORAL | 0 refills | Status: DC

## 2017-08-01 MED ORDER — ACETAMINOPHEN 500 MG PO TABS: 500.0000 mg | ORAL_TABLET | Freq: Four times a day (QID) | ORAL | 0 refills | Status: AC | PRN

## 2017-08-01 MED ORDER — INSULIN GLARGINE 100 UNIT/ML ~~LOC~~ SOLN: 15.0000 [IU] | Freq: Every day | SUBCUTANEOUS | 11 refills | Status: AC

## 2017-08-01 MED ORDER — PANTOPRAZOLE SODIUM 40 MG PO TBEC: 40.0000 mg | DELAYED_RELEASE_TABLET | Freq: Two times a day (BID) | ORAL | 0 refills | Status: AC

## 2017-08-01 MED ORDER — IPRATROPIUM-ALBUTEROL 0.5-2.5 (3) MG/3ML IN SOLN: 3.0000 mL | Freq: Two times a day (BID) | RESPIRATORY_TRACT | 0 refills | Status: DC

## 2017-08-01 NOTE — Progress Notes (Signed)
SATURATION QUALIFICATIONS: (This note is used to comply with regulatory documentation for home oxygen)  Patient Saturations on Room Air at Rest = 96%  Patient Saturations on Room Air while Ambulating = 85%  Patient Saturations on 2 Liters of oxygen while Ambulating = 98%  Please briefly explain why patient needs home oxygen:  Pt eventually will reach mid 80's with functional distances.  EHR is also labile climbing into 120's to 140's sustained when fatigued and dyspneic with spikes to 160/170's.  Supplemental O2 allows pt to stay in the mid to upper 90's. 08/01/2017  Donnella Sham, [place] [phone]  (pager)

## 2017-08-01 NOTE — Discharge Summary (Addendum)
Physician Discharge Summary  [name] WNU:[id number] DOB: March 12, 1930 DOA: 07/27/2017  PCP: Vonna Drafts, FNP  Admit date: 07/27/2017 Discharge date: 08/01/2017  Admitted From: Home Disposition:  Home  Recommendations for Outpatient Follow-up:  1. Follow up with PCP in 1- week 2. Patient has been placed on bid protonix 3. Started with atorvastatin 4. Positive desaturation below 92% on ambulation, supplemental oxygen was prescribed 3 L/m when sleeping and when ambulating  Home Health: No  Equipment/Devices: No   Discharge Condition: Stable CODE STATUS: full Diet recommendation:  Heart healthy and diabetic prudent  Brief/Interim Summary: 84 year old male who presented with chest pain. E does have significant past medical history for hypertension, dyslipidemia, diastolic heart failure and type 2 diabetes mellitus. Complain of ongoing chest pain for 24 hours, associated with exertional dyspnea and lower extremity edema. On initial physical examination blood pressure 125/83, heart rate 88, respiratory 26, oxygen saturation 80 to 93%. Moist mucous membranes, lungs were clear to auscultation bilaterally, heart S1-S2 present rhythmic, abdomen soft nontender, no lower extremity edema, left cough tenderness. Sodium 137, potassium 4.7, chloride 13, bicarbonate 23, glucose 167, creatinine 1.16. White count 7.9, 162, hematocrit 40.2, platelets 185, d-dimer 1.35. Chest x-ray had increased lung markings bilaterally, CT chest with bilaterally interstitial changes/ negative for pulmonary embolism. EKG with first-degree AV block, normal axis, inus rhythm.   Patient was admitted to hospital with working diagnosis of atypical chest pain, rule out pulmonary embolism.   1. Atypical chest pain. Patient was admitted to the medical ward, he was placed on remote telemetry monitor, cardiac enzymes were negative, patient rule out for acute cortonary syndrome. His echocardiogram showed left ventricular  ejection fraction 55-60%, unable to estimate PA pressures. Nuclear stress test with mild global hypokinesis, ejection fraction 42%, risk stratification moderate.  Patient was seen by cardiology with recommendations to continue medical management, statin was added to his medical regimen.    2. Interstitial lung disease with acute on chronic hypoxic respiratory failure. Apparently chronic, patient was placed on supplemental oxygen, he had positive desaturation on ambulation on room air, approved after 3 L/m of supplemental oxygen per nasal cannula He was placed on bronchodilator therapy with DuoNeb. Pulmonary consult recommended pulmonary function testing, and follow-up as an outpatient. Continue daily furosemide 40 mg.  3.  Esophageal dysmotility. Patient underwent esophagogram, which showed a mildly dilated esophagus with moderate subfascial dysmotility. Patient was placed on proton pump inhibitors twice daily.   4. Hypertension. Blood pressure remained well-controlled with losartan.  5. Type 2 diabetes mellitus. Patient was placed on insulin sliding-scale glucose coverage and monitoring, capillary glucose remained well-controlled.   6. Dyslipidemia. Patient was started on simvastatin, and 50, HDL 45, cholesterol 223.   Discharge Diagnoses:  Principal Problem:   Chest pain Active Problems:   DM (diabetes mellitus), type 2, uncontrolled with complications (HCC)   HTN (hypertension), benign   ILD (interstitial lung disease) ([place])    Discharge Instructions   Allergies as of 08/01/2017   No Known Allergies     Medication List    STOP taking these medications   meclizine 50 MG tablet Commonly known as:  ANTIVERT     TAKE these medications   acetaminophen 500 MG tablet Commonly known as:  TYLENOL Take 1 tablet (500 mg total) by mouth every 6 (six) hours as needed. What changed:  how much to take   atorvastatin 80 MG tablet Commonly known as:  LIPITOR Take 1 tablet (80 mg total)  by mouth daily at  6 PM.   BC HEADACHE POWDER PO Take 1 packet by mouth daily as needed (Headache).   betamethasone valerate ointment 0.1 % Commonly known as:  VALISONE Apply 1 application topically 2 (two) times daily.   cyanocobalamin 1000 MCG/ML injection Commonly known as:  (VITAMIN B-12) Inject 1,000 mcg into the muscle every 30 (thirty) days.   furosemide 40 MG tablet Commonly known as:  LASIX Take 40 mg by mouth daily.   HYDROcodone-acetaminophen 10-325 MG tablet Commonly known as:  NORCO Take 1 tablet by mouth every 6 (six) hours as needed. What changed:  when to take this   insulin glargine 100 UNIT/ML injection Commonly known as:  LANTUS Inject 0.15 mLs (15 Units total) into the skin at bedtime.   ipratropium-albuterol 0.5-2.5 (3) MG/3ML Soln Commonly known as:  DUONEB Take 3 mLs by nebulization 2 (two) times daily.   losartan 50 MG tablet Commonly known as:  COZAAR Take 50 mg by mouth daily.   nitroGLYCERIN 0.4 MG SL tablet Commonly known as:  NITROSTAT Place 1 tablet (0.4 mg total) under the tongue every 5 (five) minutes x 3 doses as needed for chest pain.   pantoprazole 40 MG tablet Commonly known as:  PROTONIX Take 1 tablet (40 mg total) by mouth 2 (two) times daily.   PROAIR HFA 108 (90 Base) MCG/ACT inhaler Generic drug:  albuterol Inhale 1 puff into the lungs every 4 (four) hours as needed for wheezing or shortness of breath.            Durable Medical Equipment  (From admission, onward)        Start     Ordered   08/01/17 0000  DME Nebulizer machine    Question:  Patient needs a nebulizer to treat with the following condition  Answer:  ILD (interstitial lung disease) ([place])   08/01/17 1110     Follow-up Information    Dixie Dials, MD. Schedule an appointment as soon as possible for a visit in 1 week(s).   Specialty:  Cardiology Contact information: [place] [address] [phone]        Vonna Drafts, FNP. Schedule an appointment as soon as possible for a visit in 1 week(s).   Specialty:  Nurse Practitioner Contact information: [address] [place] [address] [phone]        Juanito Doom, MD Follow up.   Specialty:  Pulmonary Disease Contact information: [place] [address] [phone]          No Known Allergies  Consultations:  Cardiology   Pulmonary   Procedures/Studies: Dg Chest 2 View  Result Date: 07/27/2017 CLINICAL DATA:  Chest pain and shortness of breath for 2 weeks. EXAM: CHEST  2 VIEW COMPARISON:  06/10/2017 and prior radiographs FINDINGS: Cardiomegaly and pulmonary vascular congestion noted. Chronic interstitial opacities are again noted. No definite airspace disease noted. There is no evidence of pulmonary edema, suspicious pulmonary nodule/mass, pleural effusion, or pneumothorax. No acute bony abnormalities are identified. IMPRESSION: 1. No definite acute abnormality. 2. Cardiomegaly and chronic interstitial opacities. Electronically Signed   By: Margarette Canada M.D.   On: 07/27/2017 16:03   Ct Angio Chest Pe W Or Wo Contrast  Result Date: 07/28/2017 CLINICAL DATA:  Mid chest pain for 1 day. Shortness of breath. Mild extremity swelling. Positive D-dimer. EXAM: CT ANGIOGRAPHY CHEST WITH CONTRAST TECHNIQUE: Multidetector CT imaging of the chest was performed using the standard protocol during bolus administration  of intravenous contrast. Multiplanar CT image reconstructions and MIPs were obtained to evaluate the vascular anatomy. CONTRAST:  171mL ISOVUE-370 IOPAMIDOL (ISOVUE-370) INJECTION 76% COMPARISON:  03/23/2014 FINDINGS: Cardiovascular: Good opacification of the central and segmental pulmonary arteries. No focal filling defects. No evidence of significant pulmonary embolus. Normal caliber thoracic aorta. Normal heart size. No pericardial effusion. Scattered aortic calcifications. Mediastinum/Nodes: Scattered  mediastinal lymph nodes are not pathologically enlarged. Esophagus is decompressed. Lungs/Pleura: Emphysematous changes and fibrosis in the lungs most prominent along the peripheral regions. Mild bronchiectasis likely due to traction changes. Peripheral ground-glass changes may indicate active alveolitis. No consolidation. Low lung volumes likely related to the pulmonary parenchymal process. No pleural effusions. No pneumothorax. Airways appear patent. Upper Abdomen: Multiple low-attenuation lesions demonstrated throughout the liver. Characterization is difficult due to streak artifact. Lesions appear unchanged since previous study and likely represent cysts. Previous contrast-enhanced CT abdomen and pelvis from 06/10/2017 also consistent with cysts. Surgical absence of the gallbladder. Musculoskeletal: Degenerative changes in the spine. No destructive bone lesions. Review of the MIP images confirms the above findings. IMPRESSION: 1. No evidence of significant pulmonary embolus. 2. 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 Associated low lung volumes. 3. Emphysematous changes in the lungs. 4. Multiple hepatic cysts. Aortic Atherosclerosis (ICD10-I70.0) and Emphysema (ICD10-J43.9). Electronically Signed   By: Lucienne Capers M.D.   On: 07/28/2017 02:39   Dg Esophagus  Result Date: 07/30/2017 CLINICAL DATA:  Chest pain, patulous esophagus on CT EXAM: ESOPHOGRAM/BARIUM SWALLOW TECHNIQUE: Single contrast examination was performed using  thin barium. FLUOROSCOPY TIME:  Fluoroscopy Time:  42 seconds Radiation Exposure Index (if provided by the fluoroscopic device): 24.3 mGy Number of Acquired Spot Images: 6 COMPARISON:  None. FINDINGS: Given patient's condition, limited evaluation was performed in semi recumbent supine position. Mildly dilated, fluid-filled esophagus. Moderate esophageal dysmotility. No fixed esophageal narrowing or stricture. A barium tablet was not  administered given dysmotility. Patient was not assessed for gastroesophageal reflux. IMPRESSION: Mildly dilated esophagus with moderate esophageal dysmotility. No fixed esophageal narrowing or stricture. Electronically Signed   By: Julian Hy M.D.   On: 07/30/2017 08:37   Nm Myocar Multi W/spect W/wall Motion / Ef  Result Date: 07/30/2017 CLINICAL DATA:  84 year old with pleuritic chest pain. History of hypertension, diastolic congestive heart failure and diabetes. Former smoker. EXAM: MYOCARDIAL IMAGING WITH SPECT (REST AND PHARMACOLOGIC-STRESS) GATED LEFT VENTRICULAR WALL MOTION STUDY LEFT VENTRICULAR EJECTION FRACTION TECHNIQUE: Standard myocardial SPECT imaging was performed after resting intravenous injection of 10 mCi Tc-52m tetrofosmin. Subsequently, intravenous infusion of Lexiscan was performed under the supervision of the Cardiology staff. At peak effect of the drug, 30 mCi Tc-95m tetrofosmin was injected intravenously and standard myocardial SPECT imaging was performed. Quantitative gated imaging was also performed to evaluate left ventricular wall motion, and estimate left ventricular ejection fraction. COMPARISON:  Chest CTA 07/28/2017. Nuclear medicine myocardial scan 08/30/2013. FINDINGS: Perfusion: Heterogeneous myocardial activity, likely in part secondary to motion. There are areas of decreased activity at the apex and in the distal septum, inferior and lateral walls. Overall activity appears slightly worse on the rest images, although mild reversibility in the distal inferior wall difficult to exclude. The anterior wall activity appears normal. Wall Motion: Suboptimally evaluated due to technical limitations. No focal wall motion abnormality identified. Left Ventricular Ejection Fraction: 42 % End diastolic volume 63 ml End systolic volume 37 ml IMPRESSION: 1. Limited study secondary to heterogeneous myocardial activity. This may be secondary to patient motion. Difficult to exclude  reversibility  in the distal inferior wall. 2. Mild global hypokinesis, similar to previous study. 3. Left ventricular ejection fraction 42%.  Same as previous study. 4. Non invasive risk stratification*: Moderate *2012 Appropriate Use Criteria for Coronary Revascularization Focused Update: J Am Coll Cardiol. 0350;09(3):818-299. http://content.airportbarriers.com.aspx?articleid=[email or web address] Electronically Signed   By: Richardean Sale M.D.   On: 07/30/2017 14:27       Subjective: Patient feeling better, no nausea or vomiting, dyspnea has improved, no chest pain.   Discharge Exam: Vitals:   08/01/17 0759 08/01/17 0902  BP: 133/81   Pulse: 80   Resp: 16   Temp: 98.1 F (36.7 C)   SpO2: 95% 100%   Vitals:   07/31/17 2012 08/01/17 0603 08/01/17 0759 08/01/17 0902  BP: 92/72 137/73 133/81   Pulse: 87 83 80   Resp: (!) 23 17 16    Temp: (!) 97.5 F (36.4 C) 97.6 F (36.4 C) 98.1 F (36.7 C)   TempSrc: Oral Oral Oral   SpO2: 96% 96% 95% 100%  Weight:  112.1 kg (247 lb 1.6 oz)    Height:        General: Not in pain or dyspnea,  Neurology: Awake and alert, non focal  E ENT: no pallor, no icterus, oral mucosa moist Cardiovascular: No JVD. S1-S2 present, rhythmic, no gallops, rubs, or murmurs. No lower extremity edema. Pulmonary: vesicular breath sounds bilaterally, adequate air movement, no wheezing, rhonchi or rales. Gastrointestinal. Abdomen protuberant, no organomegaly, non tender, no rebound or guarding Skin. No rashes Musculoskeletal: no joint deformities   The results of significant diagnostics from this hospitalization (including imaging, microbiology, ancillary and laboratory) are listed below for reference.     Microbiology: Recent Results (from the past 240 hour(s))  MRSA PCR Screening     Status: None   Collection Time: 07/28/17  6:00 PM  Result Value Ref Range Status   MRSA by PCR NEGATIVE NEGATIVE Final    Comment:        The GeneXpert MRSA Assay (FDA approved for  NASAL specimens only), is one component of a comprehensive MRSA colonization surveillance program. It is not intended to diagnose MRSA infection nor to guide or monitor treatment for MRSA infections.      Labs: BNP (last 3 results) Recent Labs    07/27/17 1934  BNP 37.1   Basic Metabolic Panel: Recent Labs  Lab 07/27/17 1449 07/29/17 0738 07/31/17 0437 08/01/17 0327  NA 137 138 137 138  K 4.7 4.0 4.1 4.0  CL 103 100* 102 100*  CO2 23 29 27 26   GLUCOSE 167* 180* 275* 177*  BUN 10 10 12 9   CREATININE 1.16 1.19 1.14 1.10  CALCIUM 9.0 8.7* 8.4* 8.8*   Liver Function Tests: No results for input(s): AST, ALT, ALKPHOS, BILITOT, PROT, ALBUMIN in the last 168 hours. No results for input(s): LIPASE, AMYLASE in the last 168 hours. No results for input(s): AMMONIA in the last 168 hours. CBC: Recent Labs  Lab 07/27/17 1934 07/28/17 0626 07/29/17 0738  WBC 7.9 8.2 6.4  HGB 16.2 16.3 15.8  HCT 48.2 48.7 48.0  MCV 93.6 93.8 94.1  PLT 185 179 176   Cardiac Enzymes: Recent Labs  Lab 07/28/17 0626 07/28/17 1520  TROPONINI <0.03 <0.03   BNP: Invalid input(s): POCBNP CBG: Recent Labs  Lab 07/31/17 1142 07/31/17 1640 07/31/17 2018 07/31/17 2350 08/01/17 0755  GLUCAP 233* 307* 320* 248* 198*   D-Dimer No results for input(s): DDIMER in the last 72 hours. Hgb A1c Recent Labs  07/30/17 0610  HGBA1C 10.0*   Lipid Profile Recent Labs    07/30/17 0610  CHOL 223*  HDL 45  LDLCALC 150*  TRIG 139  CHOLHDL 5.0   Thyroid function studies No results for input(s): TSH, T4TOTAL, T3FREE, THYROIDAB in the last 72 hours.  Invalid input(s): FREET3 Anemia work up No results for input(s): VITAMINB12, FOLATE, FERRITIN, TIBC, IRON, RETICCTPCT in the last 72 hours. Urinalysis    Component Value Date/Time   COLORURINE YELLOW 06/10/2017 [place] 06/10/2017 1208   LABSPEC 1.020 06/10/2017 1208   PHURINE 7.0 06/10/2017 1208   GLUCOSEU 50 (A)  06/10/2017 1208   HGBUR LARGE (A) 06/10/2017 1208   BILIRUBINUR NEGATIVE 06/10/2017 1208   KETONESUR 5 (A) 06/10/2017 1208   PROTEINUR 100 (A) 06/10/2017 1208   UROBILINOGEN 0.2 03/23/2014 1805   NITRITE NEGATIVE 06/10/2017 1208   LEUKOCYTESUR SMALL (A) 06/10/2017 1208   Sepsis Labs Invalid input(s): PROCALCITONIN,  WBC,  LACTICIDVEN Microbiology Recent Results (from the past 240 hour(s))  MRSA PCR Screening     Status: None   Collection Time: 07/28/17  6:00 PM  Result Value Ref Range Status   MRSA by PCR NEGATIVE NEGATIVE Final    Comment:        The GeneXpert MRSA Assay (FDA approved for NASAL specimens only), is one component of a comprehensive MRSA colonization surveillance program. It is not intended to diagnose MRSA infection nor to guide or monitor treatment for MRSA infections.      Time coordinating discharge: 45 minutes  SIGNED:   Tawni Millers, MD  [facility] 08/01/2017, 10:38 AM Pager [phone]  If 7PM-7AM, please contact night-coverage [email or web address] Password [email or web address]

## 2017-08-01 NOTE — Evaluation (Signed)
Occupational Therapy Evaluation Patient Details Name: [name] MRN: [id number] DOB: 11/26/1929 Today's Date: 08/01/2017    History of Present Illness Pt is an 84 y/o male with PMH significant for HTN, DM, and dCHF, admitted to the ED as instructed by pt's urologist for worsening dyspnea on exertion, pleuritic CP and left calf tenderness for a couple of days.  CT negative for PE, but concerning for chronic interstitial/diffuse parenchymal lung disease.   Clinical Impression   PTA, pt was independent with ADL and functional mobility. He reports taking frequent breaks due to diminished activity tolerance for ADL participation. Pt currently requires overall supervision for safety with ADL transfers and standing ADL tasks. He did demonstrate O2 desaturation on RA during seated and standing ADL tasks and improved with application of 1L supplemental O2 as detailed below. Pt would benefit from continued acute OT services while admitted. However, do not anticipate need for OT follow-up post-acute D/C.     Follow Up Recommendations  No OT follow up(Pulmonary rehabilitation)    Equipment Recommendations       Recommendations for Other Services       Precautions / Restrictions Precautions Precaution Comments: watch O2 sats Restrictions Weight Bearing Restrictions: No      Mobility Bed Mobility Overal bed mobility: Independent                Transfers Overall transfer level: Modified independent               General transfer comment: increased time    Balance Overall balance assessment: No apparent balance deficits (not formally assessed)                                         ADL either performed or assessed with clinical judgement   ADL Overall ADL's : Needs assistance/impaired Eating/Feeding: Sitting;Modified independent   Grooming: Supervision/safety;Sitting   Upper Body Bathing: Sitting;Modified independent   Lower Body Bathing:  Supervison/ safety;Sit to/from stand   Upper Body Dressing : Sitting;Modified independent   Lower Body Dressing: Supervision/safety;Sit to/from stand   Toilet Transfer: Supervision/safety;Ambulation   Toileting- Clothing Manipulation and Hygiene: Supervision/safety;Sit to/from stand       Functional mobility during ADLs: Supervision/safety General ADL Comments: Supervision for safety throughout OT evaluation. Pt initially on RA on my arrival with O2 saturation at 92-93%. On seated ADL activity, SpO2 desaturation into the 80s and applied 1L supplemental O2 for seated and standing ADL and pt able to maintain SpO2>89% throughout.      Vision Patient Visual Report: No change from baseline Vision Assessment?: No apparent visual deficits     Perception     Praxis      Pertinent Vitals/Pain Pain Assessment: No/denies pain     Hand Dominance     Extremity/Trunk Assessment Upper Extremity Assessment Upper Extremity Assessment: Overall WFL for tasks assessed   Lower Extremity Assessment Lower Extremity Assessment: Overall WFL for tasks assessed       Communication Communication Communication: No difficulties   Cognition Arousal/Alertness: Awake/alert Behavior During Therapy: WFL for tasks assessed/performed Overall Cognitive Status: Within Functional Limits for tasks assessed                                     General Comments  see portable O2 qualification note    Exercises  Shoulder Instructions      Home Living Family/patient expects to be discharged to:: Private residence Living Arrangements: Other relatives;Children Available Help at Discharge: Family;Available 24 hours/day Type of Home: House Home Access: Stairs to enter     Home Layout: One level     Bathroom Shower/Tub: Tub/shower unit         Home Equipment: Cane - single point          Prior Functioning/Environment Level of Independence: Independent                  OT Problem List: Cardiopulmonary status limiting activity;Decreased activity tolerance      OT Treatment/Interventions: Self-care/ADL training;Therapeutic exercise;Energy conservation;DME and/or AE instruction;Therapeutic activities;Patient/family education;Balance training    OT Goals(Current goals can be found in the care plan section) Acute Rehab OT Goals Patient Stated Goal: breathe better OT Goal Formulation: With patient Time For Goal Achievement: 08/15/17 Potential to Achieve Goals: Good ADL Goals Pt Will Perform Grooming: Independently;standing Pt Will Transfer to Toilet: Independently;ambulating Pt Will Perform Toileting - Clothing Manipulation and hygiene: Independently;sit to/from stand Additional ADL Goal #1: Pt will verbalize 3 strategies to conserve energy during morning ADL routine independently.  OT Frequency: Min 2X/week   Barriers to D/C:            Co-evaluation              AM-PAC PT "6 Clicks" Daily Activity     Outcome Measure Help from another person eating meals?: None Help from another person taking care of personal grooming?: A Little Help from another person toileting, which includes using toliet, bedpan, or urinal?: A Little Help from another person bathing (including washing, rinsing, drying)?: A Little Help from another person to put on and taking off regular upper body clothing?: None Help from another person to put on and taking off regular lower body clothing?: A Little 6 Click Score: 20   End of Session Equipment Utilized During Treatment: Oxygen  Activity Tolerance: Patient tolerated treatment well Patient left: in bed;with call bell/phone within reach  OT Visit Diagnosis: Muscle weakness (generalized) (M62.81)                Time: 1041-1100 OT Time Calculation (min): 19 min Charges:  OT Evaluation $OT Eval Low Complexity: 1 Low G-Codes:     Norman Herrlich, MS OTR/L  Pager: [place] A Rever [place] 08/01/2017, 2:29 PM

## 2017-08-01 NOTE — Progress Notes (Signed)
Physical Therapy Treatment Patient Details Name: [name] MRN: [id number] DOB: 29-Dec-1929 Today's Date: 08/01/2017    History of Present Illness Pt is an 84 y/o male with PMH significant for HTN, DM, and dCHF, admitted to the ED as instructed by pt's urologist for worsening dyspnea on exertion, pleuritic CP and left calf tenderness for a couple of days.  CT negative for PE, but concerning for chronic interstitial/diffuse parenchymal lung disease.    PT Comments    Pt have made great improvements.  Pt able to do more before fatigue sets in.  At rest he can maintain adequate sats on RA, but as distance progresses with ambulation and he gets more fatigued, sats drop to mid 80's (85% today).  With 2L [place] the sats rebound to low 90's quickly.    Follow Up Recommendations  No PT follow up;Other (comment)     Equipment Recommendations  None recommended by PT    Recommendations for Other Services       Precautions / Restrictions Precautions Precaution Comments: watch O2 sats Restrictions Weight Bearing Restrictions: No    Mobility  Bed Mobility Overal bed mobility: Independent                Transfers Overall transfer level: Modified independent               General transfer comment: increased time  Ambulation/Gait Ambulation/Gait assistance: Independent(in home environment) Ambulation Distance (Feet): 300 Feet(200, 50 with rests in between) Assistive device: None Gait Pattern/deviations: Step-through pattern   Gait velocity interpretation: at or above normal speed for age/gender General Gait Details: steady overall.  HR stable initially, but as pt fatigued, we could get back to a sitting rest point before EHR became more labile with HR climbing into the 140's and spiking higher into the 150's and 160's   Stairs            Wheelchair Mobility    Modified Rankin (Stroke Patients Only)       Balance Overall balance assessment: No apparent  balance deficits (not formally assessed)                                          Cognition Arousal/Alertness: Awake/alert Behavior During Therapy: WFL for tasks assessed/performed Overall Cognitive Status: Within Functional Limits for tasks assessed                                        Exercises      General Comments General comments (skin integrity, edema, etc.): see portable O2 qualification note      Pertinent Vitals/Pain Pain Assessment: No/denies pain    Home Living Family/patient expects to be discharged to:: Private residence Living Arrangements: Other relatives;Children Available Help at Discharge: Family;Available 24 hours/day Type of Home: House Home Access: Stairs to enter   Home Layout: One level Home Equipment: Cane - single point      Prior Function Level of Independence: Independent          PT Goals (current goals can now be found in the care plan section) Acute Rehab PT Goals Patient Stated Goal: breathe better PT Goal Formulation: With patient Time For Goal Achievement: 08/07/17 Potential to Achieve Goals: Good Progress towards PT goals: Progressing toward goals    Frequency  Min 3X/week      PT Plan Current plan remains appropriate    Co-evaluation              AM-PAC PT "6 Clicks" Daily Activity  Outcome Measure  Difficulty turning over in bed (including adjusting bedclothes, sheets and blankets)?: None Difficulty moving from lying on back to sitting on the side of the bed? : None Difficulty sitting down on and standing up from a chair with arms (e.g., wheelchair, bedside commode, etc,.)?: None Help needed moving to and from a bed to chair (including a wheelchair)?: None Help needed walking in hospital room?: None Help needed climbing 3-5 steps with a railing? : None 6 Click Score: 24    End of Session   Activity Tolerance: Patient tolerated treatment well Patient left: in bed;with  call bell/phone within reach;with bed alarm set;with family/visitor present Nurse Communication: Mobility status PT Visit Diagnosis: Other abnormalities of gait and mobility (R26.89)     Time: 5409-8119 PT Time Calculation (min) (ACUTE ONLY): 28 min  Charges:  $Gait Training: 8-22 mins $Therapeutic Activity: 8-22 mins                    G Codes:       2017-08-20  Donnella Sham, PT [phone]  (pager)   [name] Aug 20, 2017, 2:36 PM

## 2017-08-01 NOTE — Discharge Instructions (Signed)
Chest Wall Pain °Chest wall pain is pain in or around the bones and muscles of your chest. Sometimes, an injury causes this pain. Sometimes, the cause may not be known. This pain may take several weeks or longer to get better. °Follow these instructions at home: °Pay attention to any changes in your symptoms. Take these actions to help with your pain: °· Rest as told by your doctor. °· Avoid activities that cause pain. Try not to use your chest, belly (abdominal), or side muscles to lift heavy things. °· If directed, apply ice to the painful area: °? Put ice in a plastic bag. °? Place a towel between your skin and the bag. °? Leave the ice on for 20 minutes, 2-3 times per day. °· Take over-the-counter and prescription medicines only as told by your doctor. °· Do not use tobacco products, including cigarettes, chewing tobacco, and e-cigarettes. If you need help quitting, ask your doctor. °· Keep all follow-up visits as told by your doctor. This is important. ° °Contact a doctor if: °· You have a fever. °· Your chest pain gets worse. °· You have new symptoms. °Get help right away if: °· You feel sick to your stomach (nauseous) or you throw up (vomit). °· You feel sweaty or light-headed. °· You have a cough with phlegm (sputum) or you cough up blood. °· You are short of breath. °This information is not intended to replace advice given to you by your health care provider. Make sure you discuss any questions you have with your health care provider. °Document Released: 12/06/2007 Document Revised: 11/25/2015 Document Reviewed: 09/14/2014 °Elsevier Interactive Patient Education © 2018 [employer] Inc. ° °

## 2017-08-01 NOTE — Care Management Note (Signed)
Case Management Note  Patient Details  Name: [name] MRN: [id number] Date of Birth: 02-Apr-1930  Subjective/Objective: Pt presented for Chest Pain and SOB. Hx of CHF- Plan for home on Oxygen and home with [place]. PTA from home with support of daughter.                  Action/Plan: CM did offer choice to patient's daughter. Daughter chose [facility] for services. Referral made to [facility] and [place] to begin within 24-48 hours post d/c.   Expected Discharge Date:  08/01/17               Expected Discharge Plan:  [place]  In-House Referral:  NA  Discharge planning Services  CM Consult  Post Acute Care Choice:  Durable Medical Equipment, Home Health Choice offered to:  Patient, Adult Children  DME Arranged:  Oxygen DME Agency:  [place] Arranged:  RN [facility] Agency:  [place]  Status of Service:  Completed, signed off  If discussed at [place] of Stay Meetings, dates discussed:    Additional Comments:  Bethena Roys, RN 08/01/2017, 2:10 PM

## 2017-08-01 NOTE — Progress Notes (Signed)
Inpatient Diabetes Program Recommendations  AACE/ADA: New Consensus Statement on Inpatient Glycemic Control (2015)  Target Ranges:  Prepandial:   less than 140 mg/dL      Peak postprandial:   less than 180 mg/dL (1-2 hours)      Critically ill patients:  140 - 180 mg/dL   Lab Results  Component Value Date   GLUCAP 323 (H) 08/01/2017   HGBA1C 10.0 (H) 07/30/2017    Review of Glycemic ControlResults for AFFAN, CALLOW (MRN [id number]) as of 08/01/2017 15:48  Ref. Range 07/31/2017 16:40 07/31/2017 20:18 07/31/2017 23:50 08/01/2017 07:55 08/01/2017 11:33  Glucose-Capillary Latest Ref Range: 65 - 99 mg/dL 307 (H) 320 (H) 248 (H) 198 (H) 323 (H)   Note that patient is new to insulin at d/c.  Called and alerted RN.  Spoke to patient who states that he lives with his daughter.  We briefly discussed use of insulin and signs, symptoms and treatments for hypoglycemia. Patient voiced understanding, however he will need assistance from family.  He will have home health at d/c which could possibly help with DM management/ insulin.  Discussed with case Freight forwarder.  Also asked RN to review insulin administration/technique with patient and daughter prior to d/c home.    Thanks,  Adah Perl, RN, BC-ADM Inpatient Diabetes Coordinator Pager [phone] (8a-5p)

## 2017-08-06 NOTE — Telephone Encounter (Signed)
BQ ok to order below listed tests under your name?

## 2017-08-07 NOTE — Telephone Encounter (Signed)
Pt is aware of below message and voiced his understanding.  Pt has been scheduled for PFT on 3/14 @ 10:00 and SMW on 3/11 @11 :10. CTHR has been ordered. Nothing further is needed.

## 2017-08-07 NOTE — Telephone Encounter (Signed)
Order full PFT and HRCT per my usual protocol Also 38min walk

## 2017-08-16 ENCOUNTER — Inpatient Hospital Stay: Admission: RE | Admit: 2017-08-16 | Payer: Medicare HMO | Source: Ambulatory Visit

## 2017-09-04 ENCOUNTER — Other Ambulatory Visit: Payer: Self-pay | Admitting: Urology

## 2017-09-05 ENCOUNTER — Other Ambulatory Visit (HOSPITAL_COMMUNITY): Payer: Self-pay | Admitting: *Deleted

## 2017-09-05 NOTE — Progress Notes (Addendum)
Faxed hemaglobin a1c result from 07-30-17 to dr bell by epic.  ekg 07-28-17 epic Stress test 07-30-17 epic Chest xray 07-27-17 epic Chest ct 07-28-17 epic Echo 07-29-17 epic Medical clearance tammy Selina Cooley np on chart for 09-07-17 surgery

## 2017-09-05 NOTE — Patient Instructions (Addendum)
[name]  09/05/2017   Your procedure is scheduled on:  Friday 09-07-17  Report to [facility] Main  Entrance  Report to admitting at 100 PM AM   Call this number if you have problems the morning of surgery [phone]   Remember: Do not eat food After Midnight, CLEAR LIQUIDS FROM MIDNIGHT UNTIL 900 AM DAY OF SURGERY, NOTHING BY MOUTH AFTER 900 AM DAY OF SURGERY.     CLEAR LIQUID DIET   Foods Allowed                                                                     Foods Excluded  Coffee and tea, regular and decaf                             liquids that you cannot  Plain Jell-O in any flavor                                             see through such as: Fruit ices (not with fruit pulp)                                     milk, soups, orange juice  Iced Popsicles                                    All solid food Carbonated beverages, regular and diet                                    Cranberry, grape and apple juices Sports drinks like Gatorade Lightly seasoned clear broth or consume(fat free) Sugar, honey syrup  Sample Menu Breakfast                                Lunch                                     Supper Cranberry juice                    Beef broth                            Chicken broth Jell-O                                     Grape juice                           Apple juice  Coffee or tea                        Jell-O                                      Popsicle                                                Coffee or tea                        Coffee or tea  _____________________________________________________________________    How to Manage Your Diabetes Before and After Surgery  Why is it important to control my blood sugar before and after surgery? . Improving blood sugar levels before and after surgery helps healing and can limit problems. . A way of improving blood sugar control is eating a healthy diet by: o   Eating less sugar and carbohydrates o  Increasing activity/exercise o  Talking with your doctor about reaching your blood sugar goals . High blood sugars (greater than 180 mg/dL) can raise your risk of infections and slow your recovery, so you will need to focus on controlling your diabetes during the weeks before surgery. . Make sure that the doctor who takes care of your diabetes knows about your planned surgery including the date and location.  How do I manage my blood sugar before surgery? . Check your blood sugar at least 4 times a day, starting 2 days before surgery, to make sure that the level is not too high or low. o Check your blood sugar the morning of your surgery when you wake up and every 2 hours until you get to the Short Stay unit. . If your blood sugar is less than 70 mg/dL, you will need to treat for low blood sugar: o Do not take insulin. o Treat a low blood sugar (less than 70 mg/dL) with  cup of clear juice (cranberry or apple), 4 glucose tablets, OR glucose gel. o Recheck blood sugar in 15 minutes after treatment (to make sure it is greater than 70 mg/dL). If your blood sugar is not greater than 70 mg/dL on recheck, call [phone] for further instructions. . Report your blood sugar to the short stay nurse when you get to Short Stay.  . If you are admitted to the hospital after surgery: o Your blood sugar will be checked by the staff and you will probably be given insulin after surgery (instead of oral diabetes medicines) to make sure you have good blood sugar levels. o The goal for blood sugar control after surgery is 80-180 mg/dL.   WHAT DO I DO ABOUT MY DIABETES MEDICATION?   . THE NIGHT BEFORE SURGERY, TAKE 1/2 OF YOUR BEDTIME DOSE OF LANTUS INSULIN (TAKE 7 AND 1/2 UNITS )  Take these medicines the morning of surgery with A SIP OF WATER: HYDROCODONE IF NEEDED DUONEB NEBULIZER IF NEEDED, PROAIR INHALER IF NEEDED AND BRING INHALER, TAMSULOSIN (FLOMAX), PANTAPRAZOLE  (PROTONIX) DO NOT TAKE ANY DIABETIC MEDICATIONS DAY OF YOUR SURGERY  You may not have any metal on your body including hair pins and              piercings  Do not wear jewelry, make-up, lotions, powders or perfumes, deodorant             Do not wear nail polish.  Do not shave  48 hours prior to surgery.              Men may shave face and neck.   Do not bring valuables to the hospital. [name].  Contacts, dentures or bridgework may not be worn into surgery.  Leave suitcase in the car. After surgery it may be brought to your room.     Patients discharged the day of surgery will not be allowed to drive home.  Name and phone number of your driver:  Special Instructions: N/A              Please read over the following fact sheets you were given: _____________________________________________________________________  [facility] - Preparing for Surgery Before surgery, you can play an important role.  Because skin is not sterile, your skin needs to be as free of germs as possible.  You can reduce the number of germs on your skin by washing with CHG (chlorahexidine gluconate) soap before surgery.  CHG is an antiseptic cleaner which kills germs and bonds with the skin to continue killing germs even after washing. Please DO NOT use if you have an allergy to CHG or antibacterial soaps.  If your skin becomes reddened/irritated stop using the CHG and inform your nurse when you arrive at Short Stay. Do not shave (including legs and underarms) for at least 48 hours prior to the first CHG shower.  You may shave your face/neck. Please follow these instructions carefully:  1.  Shower with CHG Soap the night before surgery and the  morning of Surgery.  2.  If you choose to wash your hair, wash your hair first as usual with your  normal  shampoo.  3.  After you shampoo, rinse your hair and body thoroughly to remove the  shampoo.                            4.  Use CHG as you would any other liquid soap.  You can apply chg directly  to the skin and wash                       Gently with a scrungie or clean washcloth.  5.  Apply the CHG Soap to your body ONLY FROM THE NECK DOWN.   Do not use on face/ open                           Wound or open sores. Avoid contact with eyes, ears mouth and genitals (private parts).                       Wash face,  Genitals (private parts) with your normal soap.             6.  Wash thoroughly, paying special attention to the area where your surgery  will be performed.  7.  Thoroughly rinse your body with warm water from the  neck down.  8.  DO NOT shower/wash with your normal soap after using and rinsing off  the CHG Soap.                9.  Pat yourself dry with a clean towel.            10.  Wear clean pajamas.            11.  Place clean sheets on your bed the night of your first shower and do not  sleep with pets. Day of Surgery : Do not apply any lotions/deodorants the morning of surgery.  Please wear clean clothes to the hospital/surgery center.  FAILURE TO FOLLOW THESE INSTRUCTIONS MAY RESULT IN THE CANCELLATION OF YOUR SURGERY PATIENT SIGNATURE_________________________________  NURSE SIGNATURE__________________________________  ________________________________________________________________________

## 2017-09-06 ENCOUNTER — Ambulatory Visit (INDEPENDENT_AMBULATORY_CARE_PROVIDER_SITE_OTHER)
Admission: RE | Admit: 2017-09-06 | Discharge: 2017-09-06 | Disposition: A | Discharge status: Home / Self Care | Payer: Medicare Other | Source: Ambulatory Visit | Attending: Pulmonary Disease | Admitting: Pulmonary Disease

## 2017-09-06 ENCOUNTER — Encounter (HOSPITAL_COMMUNITY): Payer: Self-pay

## 2017-09-06 ENCOUNTER — Other Ambulatory Visit: Payer: Self-pay

## 2017-09-06 ENCOUNTER — Encounter (HOSPITAL_COMMUNITY)
Admission: RE | Admit: 2017-09-06 | Discharge: 2017-09-06 | Disposition: A | Discharge status: Home / Self Care | Payer: Medicare Other | Source: Ambulatory Visit | Attending: Urology | Admitting: Urology

## 2017-09-06 DIAGNOSIS — E669 Obesity, unspecified: Secondary | ICD-10-CM | POA: Diagnosis not present

## 2017-09-06 DIAGNOSIS — Z794 Long term (current) use of insulin: Secondary | ICD-10-CM | POA: Diagnosis not present

## 2017-09-06 DIAGNOSIS — N471 Phimosis: Secondary | ICD-10-CM | POA: Diagnosis present

## 2017-09-06 DIAGNOSIS — Z7982 Long term (current) use of aspirin: Secondary | ICD-10-CM | POA: Diagnosis not present

## 2017-09-06 DIAGNOSIS — J849 Interstitial pulmonary disease, unspecified: Secondary | ICD-10-CM

## 2017-09-06 DIAGNOSIS — E785 Hyperlipidemia, unspecified: Secondary | ICD-10-CM | POA: Diagnosis not present

## 2017-09-06 DIAGNOSIS — I110 Hypertensive heart disease with heart failure: Secondary | ICD-10-CM | POA: Diagnosis not present

## 2017-09-06 DIAGNOSIS — K219 Gastro-esophageal reflux disease without esophagitis: Secondary | ICD-10-CM | POA: Diagnosis not present

## 2017-09-06 DIAGNOSIS — Z6834 Body mass index (BMI) 34.0-34.9, adult: Secondary | ICD-10-CM | POA: Diagnosis not present

## 2017-09-06 DIAGNOSIS — Z79899 Other long term (current) drug therapy: Secondary | ICD-10-CM | POA: Diagnosis not present

## 2017-09-06 DIAGNOSIS — J449 Chronic obstructive pulmonary disease, unspecified: Secondary | ICD-10-CM | POA: Diagnosis not present

## 2017-09-06 DIAGNOSIS — E1140 Type 2 diabetes mellitus with diabetic neuropathy, unspecified: Secondary | ICD-10-CM | POA: Diagnosis not present

## 2017-09-06 DIAGNOSIS — Z87891 Personal history of nicotine dependence: Secondary | ICD-10-CM | POA: Diagnosis not present

## 2017-09-06 DIAGNOSIS — I509 Heart failure, unspecified: Secondary | ICD-10-CM | POA: Diagnosis not present

## 2017-09-06 HISTORY — DX: Phimosis: N47.1

## 2017-09-06 HISTORY — DX: Gastro-esophageal reflux disease without esophagitis: K21.9

## 2017-09-06 HISTORY — DX: Heart failure, unspecified: I50.9

## 2017-09-06 HISTORY — DX: Dyspnea, unspecified: R06.00

## 2017-09-06 HISTORY — DX: Chronic obstructive pulmonary disease, unspecified: J44.9

## 2017-09-06 HISTORY — DX: Chest pain, unspecified: R07.9

## 2017-09-06 LAB — CBC
HCT: 49.9 % (ref 39.0–52.0)
HEMOGLOBIN: 16.8 g/dL (ref 13.0–17.0)
MCH: 31.8 pg (ref 26.0–34.0)
MCHC: 33.7 g/dL (ref 30.0–36.0)
MCV: 94.5 fL (ref 78.0–100.0)
Platelets: 190 10*3/uL (ref 150–400)
RBC: 5.28 MIL/uL (ref 4.22–5.81)
RDW: 14.2 % (ref 11.5–15.5)
WBC: 7.6 10*3/uL (ref 4.0–10.5)

## 2017-09-06 LAB — BASIC METABOLIC PANEL
ANION GAP: 8 (ref 5–15)
BUN: 13 mg/dL (ref 6–20)
CALCIUM: 8.8 mg/dL — AB (ref 8.9–10.3)
CHLORIDE: 103 mmol/L (ref 101–111)
CO2: 25 mmol/L (ref 22–32)
Creatinine, Ser: 1.24 mg/dL (ref 0.61–1.24)
GFR calc non Af Amer: 51 mL/min — ABNORMAL LOW (ref >60)
GFR, EST AFRICAN AMERICAN: 59 mL/min — AB (ref >60)
GLUCOSE: 198 mg/dL — AB (ref 65–99)
POTASSIUM: 4.3 mmol/L (ref 3.5–5.1)
Sodium: 136 mmol/L (ref 135–145)

## 2017-09-06 LAB — SURGICAL PCR SCREEN
MRSA, PCR: NEGATIVE
Staphylococcus aureus: POSITIVE — AB

## 2017-09-06 LAB — GLUCOSE, CAPILLARY: Glucose-Capillary: 217 mg/dL — ABNORMAL HIGH (ref 65–99)

## 2017-09-06 NOTE — Consult Note (Signed)
I saw [name] today in the preop clinic in anticipation of his surgical procedure tomorrow. I was asked to see him due to his h/o CHF, chronic pulmonary fibrosis, and exertional chest pain. He has recently undergone both an echo as well as nuclear stress test that place him at moderate risk. His cardiac risk is much less concerning to me than his pulmonary risk. He states that he gets short of breath with exertion and also experiences chest pain that is relieved by rest. He does have pleuritic chest pain noted in his history and he is to have a chest CT this afternoon. I spoke to [name] about the anesthetic options related to this surgery and that with his respiratory history, general anesthesia may not be the best option. I talked with him about both spinal as well as MAC with penile block per surgeon as alternatives to GA. He stated that he was open to whatever we thought was best for him.

## 2017-09-06 NOTE — Progress Notes (Signed)
Patient saw by dr Gwyndolyn Saxon [facility] made aware of patient medical history, 07-30-17 stress test results epic, 07-31-17 ekg results epic, chest ct results epic, echo results epic 08-01-15, patient  has appointment for chest ct 300 pm today [facility] for chest ct. Patient ok for 09-07-17 surgery per dr Gwyndolyn [facility].  Went over patient day of surgery instructions with patient and patient daughter [name] per patient request. Made patient daughter aware of patient appointment for chest ct today 300 pm [facility].

## 2017-09-07 ENCOUNTER — Other Ambulatory Visit: Payer: Self-pay

## 2017-09-07 ENCOUNTER — Encounter (HOSPITAL_COMMUNITY): Payer: Self-pay | Admitting: Certified Registered Nurse Anesthetist

## 2017-09-07 ENCOUNTER — Ambulatory Visit (HOSPITAL_COMMUNITY): Payer: Medicare Other | Admitting: Anesthesiology

## 2017-09-07 ENCOUNTER — Encounter (HOSPITAL_COMMUNITY)
Admission: RE | Disposition: A | Discharge status: Home / Self Care | Payer: Self-pay | Source: Ambulatory Visit | Attending: Urology

## 2017-09-07 ENCOUNTER — Ambulatory Visit (HOSPITAL_COMMUNITY)
Admission: RE | Admit: 2017-09-07 | Discharge: 2017-09-07 | Disposition: A | Discharge status: Home / Self Care | Payer: Medicare Other | Source: Ambulatory Visit | Attending: Urology | Admitting: Urology

## 2017-09-07 DIAGNOSIS — Z79899 Other long term (current) drug therapy: Secondary | ICD-10-CM | POA: Insufficient documentation

## 2017-09-07 DIAGNOSIS — Z794 Long term (current) use of insulin: Secondary | ICD-10-CM | POA: Insufficient documentation

## 2017-09-07 DIAGNOSIS — I110 Hypertensive heart disease with heart failure: Secondary | ICD-10-CM | POA: Insufficient documentation

## 2017-09-07 DIAGNOSIS — Z87891 Personal history of nicotine dependence: Secondary | ICD-10-CM | POA: Insufficient documentation

## 2017-09-07 DIAGNOSIS — J449 Chronic obstructive pulmonary disease, unspecified: Secondary | ICD-10-CM | POA: Insufficient documentation

## 2017-09-07 DIAGNOSIS — Z6834 Body mass index (BMI) 34.0-34.9, adult: Secondary | ICD-10-CM | POA: Insufficient documentation

## 2017-09-07 DIAGNOSIS — N471 Phimosis: Secondary | ICD-10-CM | POA: Insufficient documentation

## 2017-09-07 DIAGNOSIS — E1140 Type 2 diabetes mellitus with diabetic neuropathy, unspecified: Secondary | ICD-10-CM | POA: Insufficient documentation

## 2017-09-07 DIAGNOSIS — Z7982 Long term (current) use of aspirin: Secondary | ICD-10-CM | POA: Insufficient documentation

## 2017-09-07 DIAGNOSIS — E785 Hyperlipidemia, unspecified: Secondary | ICD-10-CM | POA: Insufficient documentation

## 2017-09-07 DIAGNOSIS — I509 Heart failure, unspecified: Secondary | ICD-10-CM | POA: Diagnosis not present

## 2017-09-07 DIAGNOSIS — K219 Gastro-esophageal reflux disease without esophagitis: Secondary | ICD-10-CM | POA: Insufficient documentation

## 2017-09-07 DIAGNOSIS — E669 Obesity, unspecified: Secondary | ICD-10-CM | POA: Insufficient documentation

## 2017-09-07 HISTORY — PX: CIRCUMCISION: SHX1350

## 2017-09-07 LAB — GLUCOSE, CAPILLARY
GLUCOSE-CAPILLARY: 170 mg/dL — AB (ref 65–99)
GLUCOSE-CAPILLARY: 125 mg/dL — AB (ref 65–99)

## 2017-09-07 SURGERY — CIRCUMCISION, ADULT
Anesthesia: General

## 2017-09-07 MED ORDER — FENTANYL CITRATE (PF) 100 MCG/2ML IJ SOLN
INTRAMUSCULAR | Status: DC | PRN
  Administered 2017-09-07: 50 ug via INTRAVENOUS

## 2017-09-07 MED ORDER — ONDANSETRON HCL 4 MG/2ML IJ SOLN
INTRAMUSCULAR | Status: AC
  Filled 2017-09-07: qty 2

## 2017-09-07 MED ORDER — PROPOFOL 10 MG/ML IV BOLUS
INTRAVENOUS | Status: DC | PRN
  Administered 2017-09-07 (×4): 10 mg via INTRAVENOUS

## 2017-09-07 MED ORDER — ONDANSETRON HCL 4 MG/2ML IJ SOLN
INTRAMUSCULAR | Status: DC | PRN
  Administered 2017-09-07: 4 mg via INTRAVENOUS

## 2017-09-07 MED ORDER — OXYCODONE HCL 5 MG PO TABS: 5.0000 mg | ORAL_TABLET | Freq: Once | ORAL | Status: DC | PRN

## 2017-09-07 MED ORDER — LACTATED RINGERS IV SOLN
INTRAVENOUS | Status: DC
  Administered 2017-09-07: 13:00:00 via INTRAVENOUS

## 2017-09-07 MED ORDER — OXYCODONE HCL 5 MG/5ML PO SOLN: 5.0000 mg | Freq: Once | ORAL | Status: DC | PRN

## 2017-09-07 MED ORDER — PROPOFOL 500 MG/50ML IV EMUL
INTRAVENOUS | Status: DC | PRN
  Administered 2017-09-07: 50 ug/kg/min via INTRAVENOUS

## 2017-09-07 MED ORDER — BUPIVACAINE HCL (PF) 0.25 % IJ SOLN
INTRAMUSCULAR | Status: DC | PRN
  Administered 2017-09-07: 20 mL

## 2017-09-07 MED ORDER — CEFAZOLIN SODIUM-DEXTROSE 2-4 GM/100ML-% IV SOLN
2.0000 g | INTRAVENOUS | Status: AC
  Administered 2017-09-07: 2 g via INTRAVENOUS
  Filled 2017-09-07: qty 100

## 2017-09-07 MED ORDER — ONDANSETRON HCL 4 MG/2ML IJ SOLN: 4.0000 mg | Freq: Once | INTRAMUSCULAR | Status: DC | PRN

## 2017-09-07 MED ORDER — 0.9 % SODIUM CHLORIDE (POUR BTL) OPTIME
TOPICAL | Status: DC | PRN
  Administered 2017-09-07: 1000 mL

## 2017-09-07 MED ORDER — TRAMADOL HCL 50 MG PO TABS: 50.0000 mg | ORAL_TABLET | Freq: Four times a day (QID) | ORAL | 0 refills | Status: AC | PRN

## 2017-09-07 MED ORDER — BUPIVACAINE-EPINEPHRINE (PF) 0.5% -1:200000 IJ SOLN
INTRAMUSCULAR | Status: AC
  Filled 2017-09-07: qty 30

## 2017-09-07 MED ORDER — PROPOFOL 10 MG/ML IV BOLUS
INTRAVENOUS | Status: AC
  Filled 2017-09-07: qty 20

## 2017-09-07 MED ORDER — FENTANYL CITRATE (PF) 100 MCG/2ML IJ SOLN
INTRAMUSCULAR | Status: AC
  Filled 2017-09-07: qty 2

## 2017-09-07 MED ORDER — BUPIVACAINE HCL (PF) 0.25 % IJ SOLN
INTRAMUSCULAR | Status: AC
  Filled 2017-09-07: qty 30

## 2017-09-07 MED ORDER — FENTANYL CITRATE (PF) 100 MCG/2ML IJ SOLN: 25.0000 ug | INTRAMUSCULAR | Status: DC | PRN

## 2017-09-07 MED ORDER — LIDOCAINE 2% (20 MG/ML) 5 ML SYRINGE
INTRAMUSCULAR | Status: AC
  Filled 2017-09-07: qty 5

## 2017-09-07 SURGICAL SUPPLY — 25 items
BLADE SURG 15 STRL LF DISP TIS (BLADE) ×1 IMPLANT
BLADE SURG 15 STRL SS (BLADE) ×2
BNDG COHESIVE 1X5 TAN STRL LF (GAUZE/BANDAGES/DRESSINGS) ×3 IMPLANT
COVER SURGICAL LIGHT HANDLE (MISCELLANEOUS) ×3 IMPLANT
DRAPE LAPAROTOMY T 98X78 PEDS (DRAPES) ×3 IMPLANT
ELECT PENCIL ROCKER SW 15FT (MISCELLANEOUS) ×3 IMPLANT
ELECT REM PT RETURN 15FT ADLT (MISCELLANEOUS) ×3 IMPLANT
GAUZE PETROLATUM 1 X8 (GAUZE/BANDAGES/DRESSINGS) IMPLANT
GAUZE SPONGE 4X4 12PLY STRL (GAUZE/BANDAGES/DRESSINGS) ×3 IMPLANT
GAUZE SPONGE 4X4 16PLY XRAY LF (GAUZE/BANDAGES/DRESSINGS) ×3 IMPLANT
GAUZE XEROFORM 1X8 LF (GAUZE/BANDAGES/DRESSINGS) ×3 IMPLANT
GLOVE BIOGEL M STRL SZ7.5 (GLOVE) ×3 IMPLANT
GOWN STRL REUS W/TWL LRG LVL3 (GOWN DISPOSABLE) ×3 IMPLANT
KIT BASIN OR (CUSTOM PROCEDURE TRAY) ×3 IMPLANT
NEEDLE HYPO 22GX1.5 SAFETY (NEEDLE) ×3 IMPLANT
NS IRRIG 1000ML POUR BTL (IV SOLUTION) IMPLANT
PACK BASIC VI WITH GOWN DISP (CUSTOM PROCEDURE TRAY) ×3 IMPLANT
SUT CHROMIC 3 0 PS 2 (SUTURE) IMPLANT
SUT CHROMIC 3 0 SH 27 (SUTURE) ×6 IMPLANT
SUT SILK 2 0 (SUTURE)
SUT SILK 2-0 18XBRD TIE 12 (SUTURE) IMPLANT
SUT SILK 4 0 SH CR/8 (SUTURE) ×3 IMPLANT
SYR CONTROL 10ML LL (SYRINGE) ×3 IMPLANT
TOWEL OR 17X26 10 PK STRL BLUE (TOWEL DISPOSABLE) ×3 IMPLANT
WATER STERILE IRR 1000ML POUR (IV SOLUTION) IMPLANT

## 2017-09-07 NOTE — Anesthesia Postprocedure Evaluation (Signed)
Anesthesia Post Note  Patient: [name]  Procedure(s) Performed: CIRCUMCISION (N/A )     Patient location during evaluation: PACU Anesthesia Type: MAC and Regional Level of consciousness: awake and alert Pain management: pain level controlled Vital Signs Assessment: post-procedure vital signs reviewed and stable Respiratory status: spontaneous breathing, nonlabored ventilation, respiratory function stable and patient connected to nasal cannula oxygen Cardiovascular status: stable and blood pressure returned to baseline Anesthetic complications: no    Last Vitals:  Vitals:   09/07/17 1615 09/07/17 1625  BP: 136/82 (!) 153/93  Pulse: 70 73  Resp: 13 16  Temp:  36.7 C  SpO2: 100% 100%    Last Pain:  Vitals:   09/07/17 1625  TempSrc:   PainSc: 0-No pain                 Audry Pili

## 2017-09-07 NOTE — Interval H&P Note (Signed)
History and Physical Interval Note:  09/07/2017 2:14 PM  [name]  has presented today for surgery, with the diagnosis of PHIMOSIS  The various methods of treatment have been discussed with the patient and family. After consideration of risks, benefits and other options for treatment, the patient has consented to  Procedure(s): DORSAL SLIT VS CIRCUMCISION ADULT (N/A) as a surgical intervention .  The patient's history has been reviewed, patient examined, no change in status, stable for surgery.  I have reviewed the patient's chart and labs.  Questions were answered to the patient's satisfaction.     [name], [name]

## 2017-09-07 NOTE — Anesthesia Preprocedure Evaluation (Addendum)
Anesthesia Evaluation  Patient identified by MRN, date of birth, ID band Patient awake    Reviewed: Allergy & Precautions, NPO status , Patient's Chart, lab work & pertinent test results  Airway Mallampati: II  TM Distance: >3 FB Neck ROM: Full    Dental  (+) Dental Advisory Given, Missing, Poor Dentition   Pulmonary COPD,  oxygen dependent, former smoker,  Interstitial Lung Disease   Pulmonary exam normal breath sounds clear to auscultation       Cardiovascular hypertension, Pt. on medications +CHF  (-) Past MI Normal cardiovascular exam Rhythm:Regular Rate:Normal  EKG - SR with 1st deg AVB  '19 TTE - Mild LVH. EF 55% to 60%. Grade 1 diastolic   dysfunction.   Neuro/Psych Diabetic neuropathy negative psych ROS   GI/Hepatic Neg liver ROS, GERD  Controlled and Medicated,  Endo/Other  diabetes, Type 2, Insulin DependentObesity  Renal/GU CRFRenal disease   Phimosis    Musculoskeletal  (+) Arthritis ,   Abdominal   Peds  Hematology Polycythemia   Anesthesia Other Findings   Reproductive/Obstetrics                           Anesthesia Physical Anesthesia Plan  ASA: [name]  Anesthesia Plan: MAC   Post-op Pain Management:    Induction: Intravenous  PONV Risk Score and Plan: 1 and Treatment may vary due to age or medical condition and Propofol infusion  Airway Management Planned: Natural Airway and Simple Face Mask  Additional Equipment: None  Intra-op Plan:   Post-operative Plan:   Informed Consent: I have reviewed the patients History and Physical, chart, labs and discussed the procedure including the risks, benefits and alternatives for the proposed anesthesia with the patient or authorized representative who has indicated his/her understanding and acceptance.   Dental advisory given  Plan Discussed with: CRNA and Surgeon  Anesthesia Plan Comments: (Patient with recent chest  pain 4 days ago. Similar to chest pain he's experienced in past. Chest pain occurred while walking, unrelieved with rest and nitroglycerin, but improved after taking Norco. Patient with recent echo, no signs of RWMA. EKG done, no change from previous, no signs of ischemia. Feel pain is most likely non-cardiac in origin, ok to proceed. Discussed at length with patient. Surgeon will do penile block and we will proceed with MAC.)      Anesthesia Quick Evaluation

## 2017-09-07 NOTE — Op Note (Signed)
Operative Note  Preoperative diagnosis:  1.  Phimosis  Postoperative diagnosis: 1.  Phimosis  Procedure(s): 1.  Circumcision  Surgeon: Link Snuffer, MD  Assistants: None  Anesthesia: MAC with sedation with local anesthetic  Complications: None immediate  EBL: 20 cc  Specimens: 1.  Foreskin  Drains/Catheters: 1.  None  Intraoperative findings: Phimosis  Indication: 84YO male with phimosis desires circumcision.  Description of procedure:  The patient was identified and consent was obtained.  The patient was taken to the operating room and placed in the supine position.  The patient was placed under MAC anesthesia. Perioperative antibiotics were administered.  The patient was placed in supine position.  Patient was prepped and draped in a standard sterile fashion and a timeout was performed.  Perioperative antibiotics were administered.  10 cc of 2% lidocaine plain was used to perform a dorsal nerve block and another 10 cc was used to perform a penile ring block. Foreskin adhesions were released with a hemostat.  The foreskin was then retracted and Betadine was applied to the inner portion of the foreskin the head of the penis.  A marking pen was used to mark out a circumferential line approximate 1 cm proximal to the corona.  A scalpel was used to sharply incise along this line.  The foreskin was then returned to its normal position and another circumferential line was made along the level of the corona with a marking pen.  A scalpel was again used to sharply incise along this line.  A straight clamp was used to clamp the midline of the foreskin.  This was then divided.  The excess foreskin was then removed with Bovie electrocautery taking great care not to come close to the glans.  The foreskin was collected for specimen.  Hemostasis was obtained with Bovie electrocautery.  The skin was then reapproximated with interrupted 3-0 chromic sutures and a dressing was applied.  This  concluded the operation.  The patient tolerated the procedure well was stable postoperatively.  Plan: Patient will return in several weeks for a postoperative check.

## 2017-09-07 NOTE — Transfer of Care (Signed)
Immediate Anesthesia Transfer of Care Note  Patient: [name]  Procedure(s) Performed: CIRCUMCISION (N/A )  Patient Location: PACU  Anesthesia Type:MAC  Level of Consciousness: drowsy and patient cooperative  Airway & Oxygen Therapy: Patient Spontanous Breathing and Patient connected to face mask oxygen  Post-op Assessment: Report given to RN and Post -op Vital signs reviewed and stable  Post vital signs: Reviewed and stable  Last Vitals:  Vitals:   09/07/17 1218  BP: 120/76  Pulse: 90  Resp: 18  Temp: 36.8 C  SpO2: 97%    Last Pain:  Vitals:   09/07/17 1256  TempSrc:   PainSc: 5          Complications: No apparent anesthesia complications

## 2017-09-07 NOTE — H&P (Signed)
H&P  Chief Complaint: phimosis  History of Present Illness: 84 YO M with phimosis and significant voiding complaints presents for circumcision vs dorsal slit.   Past Medical History:  Diagnosis Date  . Arthritis   . Chest pain    with activity  . CHF (congestive heart failure) ([place])   . COPD (chronic obstructive pulmonary disease) ([place])   . Diabetes mellitus    type 2   . Dyspnea    home oxygen 3 liters prn, sob with activity, chronic interstitial fibrosis  . GERD (gastroesophageal reflux disease)   . Hyperlipidemia   . Hypertension   . Neuropathy   . Obesity   . Phimosis    Past Surgical History:  Procedure Laterality Date  . ABDOMINAL SURGERY     pt has abdominal scarring and states he had sx but not sure what kind  . BACK SURGERY    . CHOLECYSTECTOMY      Home Medications:  Medications Prior to Admission  Medication Sig Dispense Refill Last Dose  . acetaminophen (TYLENOL) 500 MG tablet Take 1 tablet (500 mg total) by mouth every 6 (six) hours as needed. 30 tablet 0 Past Month at Unknown time  . Aspirin-Salicylamide-Caffeine (BC HEADACHE POWDER PO) Take 1 packet by mouth daily as needed (Headache).   Past Week at Unknown time  . clotrimazole (LOTRIMIN) 1 % cream Apply 1 application topically 2 (two) times daily.   Past Week at Unknown time  . fluconazole (DIFLUCAN) 150 MG tablet Take 150 mg by mouth daily.   09/06/2017 at Unknown time  . furosemide (LASIX) 40 MG tablet Take 40 mg by mouth daily.    09/06/2017 at Unknown time  . HYDROcodone-acetaminophen (NORCO) 10-325 MG per tablet Take 1 tablet by mouth every 6 (six) hours as needed. (Patient taking differently: Take 1 tablet by mouth 4 (four) times daily. ) 10 tablet 0 09/06/2017 at Unknown time  . insulin glargine (LANTUS) 100 UNIT/ML injection Inject 0.15 mLs (15 Units total) into the skin at bedtime. (Patient taking differently: Inject 15 Units into the skin at bedtime. ) 10 mL 11 09/06/2017 at Unknown time  .  ipratropium-albuterol (DUONEB) 0.5-2.5 (3) MG/3ML SOLN Take 3 mLs by nebulization 2 (two) times daily. 180 mL 0   . losartan (COZAAR) 50 MG tablet Take 50 mg by mouth daily.   Past Week at Unknown time  . nitroGLYCERIN (NITROSTAT) 0.4 MG SL tablet Place 1 tablet (0.4 mg total) under the tongue every 5 (five) minutes x 3 doses as needed for chest pain. 25 tablet 1 Past Week at Unknown time  . nystatin (NYSTATIN) powder Apply 1 g topically 3 (three) times daily.   Past Month at Unknown time  . pantoprazole (PROTONIX) 40 MG tablet Take 1 tablet (40 mg total) by mouth 2 (two) times daily. 60 tablet 0   . PROAIR HFA 108 (90 BASE) MCG/ACT inhaler Inhale 1 puff into the lungs every 4 (four) hours as needed for wheezing or shortness of breath.    09/06/2017 at Unknown time  . atorvastatin (LIPITOR) 80 MG tablet Take 1 tablet (80 mg total) by mouth daily at 6 PM. 30 tablet 0   . cephALEXin (KEFLEX) 500 MG capsule Take 500 mg by mouth 2 (two) times daily.   Unknown at Unknown time  . ciprofloxacin (CIPRO) 500 MG tablet Take 500 mg by mouth 2 (two) times daily.   Unknown at Unknown time  . ketoconazole (NIZORAL) 2 % cream Apply 1 application topically  daily.   Unknown at Unknown time  . ketoconazole (NIZORAL) 200 MG tablet Take 200 mg by mouth daily.   Unknown at Unknown time  . metroNIDAZOLE (FLAGYL) 500 MG tablet Take 500 mg by mouth 2 (two) times daily.   Unknown at Unknown time  . OXYGEN Inhale into the lungs. 3 liters prn   Unknown at Unknown time  . tamsulosin (FLOMAX) 0.4 MG CAPS capsule Take 0.4 mg by mouth 2 (two) times daily.   More than a month at Unknown time   Allergies: No Known Allergies  Family History  Problem Relation Age of Onset  . Cancer Mother    Social History:  reports that he quit smoking about 20 years ago. His smoking use included cigarettes. He quit after 4.00 years of use. he has never used smokeless tobacco. He reports that he does not drink alcohol or use drugs.  ROS: A  complete review of systems was performed.  All systems are negative except for pertinent findings as noted. ROS   Physical Exam:  Vital signs in last 24 hours: Temp:  [98.2 F (36.8 C)] 98.2 F (36.8 C) (03/08 1218) Pulse Rate:  [90] 90 (03/08 1218) Resp:  [18] 18 (03/08 1218) BP: (120)/(76) 120/76 (03/08 1218) SpO2:  [97 %] 97 % (03/08 1218) Weight:  [111.6 kg (246 lb)] 111.6 kg (246 lb) (03/08 1256) General:  Alert and oriented, No acute distress HEENT: Normocephalic, atraumatic Neck: No JVD or lymphadenopathy Cardiovascular: Regular rate and rhythm Lungs: Regular rate and effort Abdomen: Soft, nontender, nondistended, no abdominal masses GU: significant phimosis Back: No CVA tenderness Extremities: No edema Neurologic: Grossly intact  Laboratory Data:  Results for orders placed or performed during the hospital encounter of 09/07/17 (from the past 24 hour(s))  Glucose, capillary     Status: Abnormal   Collection Time: 09/07/17 12:16 PM  Result Value Ref Range   Glucose-Capillary 170 (H) 65 - 99 mg/dL   Comment 1 Notify RN    Recent Results (from the past 240 hour(s))  Surgical pcr screen     Status: Abnormal   Collection Time: 09/06/17 11:15 AM  Result Value Ref Range Status   MRSA, PCR NEGATIVE NEGATIVE Final   Staphylococcus aureus POSITIVE (A) NEGATIVE Final    Comment: (NOTE) The Xpert SA Assay (FDA approved for NASAL specimens in patients 71 years of age and older), is one component of a comprehensive surveillance program. It is not intended to diagnose infection nor to guide or monitor treatment. Performed at [facility], [place] [address] [place], [place] [address]    Creatinine: Recent Labs    09/06/17 1222  CREATININE 1.24    Impression/Assessment:  Phimosis   Plan:  proceed w/ circumcision vs dorsal slit.  [name], [name] 09/07/2017, 2:12 PM

## 2017-09-07 NOTE — Progress Notes (Signed)
Notified Dr. Fransisco Beau that pt has had cp this past week, has had to take nitroglycerin. Received verbal order for EKG.

## 2017-09-07 NOTE — Discharge Instructions (Signed)
Discharge instructions following circumcision  Call your doctor for:  Fever is greater than 100.5  Severe nausea or vomiting  Increasing pain not controlled by pain medication  Increasing redness or drainage from incisions  The number for questions or concerns is [phone]  Activity level: No lifting greater than 20 pounds (about equal to milk) for the next 2 weeks or until cleared to do so at follow-up appointment.  Otherwise activity as tolerated by comfort level.  Diet: May resume your regular diet as tolerated  Driving: No driving while still taking opiate pain medications (weight at least 6-8 hours after last dose).  No driving if you still sore from surgery as it may limit her ability to react quickly if necessary.   Shower/bath: May shower and get incision wet pad dry immediately following.  Do not scrub vigorously for the next 2-3 weeks.  Do not soak incision (ID soaking in bath or swimming) until told he may do so by Dr. Gloriann Loan, as this may promote a wound infection.  Wound care: Remove the dressing in 2 days.  If the dressing falls off, just leave it off and do not worry about it.  If the dressing feels like it is too tight just go ahead and take it off.  After removing the dressing, apply Neosporin or bacitracin to the stitches twice daily.  Follow-up appointments: Follow-up appointment will be scheduled for a wound check.

## 2017-09-10 ENCOUNTER — Ambulatory Visit: Payer: Self-pay

## 2017-09-10 ENCOUNTER — Encounter (HOSPITAL_COMMUNITY): Payer: Self-pay | Admitting: Urology

## 2017-09-13 ENCOUNTER — Encounter: Payer: Self-pay | Admitting: Pulmonary Disease

## 2017-09-13 ENCOUNTER — Other Ambulatory Visit: Payer: Medicare Other

## 2017-09-13 ENCOUNTER — Ambulatory Visit (INDEPENDENT_AMBULATORY_CARE_PROVIDER_SITE_OTHER): Payer: Medicare Other | Admitting: Pulmonary Disease

## 2017-09-13 VITALS — BP 140/80 | HR 97 | Ht 68.25 in | Wt 255.0 lb

## 2017-09-13 DIAGNOSIS — G4733 Obstructive sleep apnea (adult) (pediatric): Secondary | ICD-10-CM | POA: Diagnosis not present

## 2017-09-13 DIAGNOSIS — J84112 Idiopathic pulmonary fibrosis: Secondary | ICD-10-CM

## 2017-09-13 DIAGNOSIS — J849 Interstitial pulmonary disease, unspecified: Secondary | ICD-10-CM | POA: Diagnosis not present

## 2017-09-13 LAB — PULMONARY FUNCTION TEST
DL/VA % pred: 127 %
DL/VA: 5.69 ml/min/mmHg/L
DLCO COR % PRED: 44 %
DLCO UNC % PRED: 47 %
DLCO UNC: 14.15 ml/min/mmHg
DLCO cor: 13.39 ml/min/mmHg
FEF 25-75 POST: 0.82 L/s
FEF 25-75 PRE: 1.02 L/s
FEF2575-%CHANGE-POST: -19 %
FEF2575-%PRED-POST: 49 %
FEF2575-%Pred-Pre: 61 %
FEV1-%Change-Post: -9 %
FEV1-%PRED-POST: 42 %
FEV1-%Pred-Pre: 46 %
FEV1-Post: 0.98 L
FEV1-Pre: 1.08 L
FEV1FVC-%Change-Post: -3 %
FEV1FVC-%PRED-PRE: 109 %
FEV6-%Change-Post: -4 %
FEV6-%Pred-Post: 41 %
FEV6-%Pred-Pre: 42 %
FEV6-Post: 1.26 L
FEV6-Pre: 1.32 L
FEV6FVC-%Pred-Post: 107 %
FEV6FVC-%Pred-Pre: 107 %
FVC-%Change-Post: -5 %
FVC-%PRED-PRE: 40 %
FVC-%Pred-Post: 38 %
FVC-POST: 1.26 L
FVC-PRE: 1.34 L
POST FEV1/FVC RATIO: 78 %
PRE FEV1/FVC RATIO: 81 %
Post FEV6/FVC ratio: 100 %
Pre FEV6/FVC Ratio: 100 %
RV % pred: 37 %
RV: 1.02 L
TLC % pred: 38 %
TLC: 2.58 L

## 2017-09-13 NOTE — Patient Instructions (Addendum)
This means that on the CT scan of your chest which was performed in January 2019 we found scarring throughout your lungs.  This condition can come from many different problems but in your case we think that it is a condition called idiopathic pulmonary fibrosis. I think the best approach moving forward is to start a medicine called Ofev as to help slow the progression of the disease.  This medicine is taken twice a day.  It can slightly increase your bleeding risk so you should not take the medicine if you are going to have surgery.  It can also cause rash and sometimes an upset stomach.  It will take [place] some time to get approval for the medicine.  We will write a prescription for 150 mg twice a day.  I also think it is important for you to be seen by our interstitial lung disease clinic so I am going to refer you for a second opinion to see Dr. Chase Caller in the interstitial lung disease visit here.  30-minute visit only.   Obstructive sleep apnea: We will arrange for a another CPAP machine We will set up a ResMed 10 auto titrating device which will titrate pressure between 5 and 20 cm of water  We will check some more blood work today and we will have you follow-up in the interstitial lung disease clinic with Dr. Chase Caller in 4-6 weeks.

## 2017-09-13 NOTE — Progress Notes (Signed)
sl    Subjective:   PATIENT ID: [name] GENDER: male DOB: [birth year], MRN: [id number]  Synopsis: First seen while hospitalized for chest pain in 2018, found to have evidence of an underlying interstitial lung disease.   HPI  Chief Complaint  Patient presents with  . Follow-up    Hospital on 09/08/17- Says he is still having trouble breathing through out the day and is sleeping alot not getting out of bed much.   This is a pleasant 84 year old male whom I met in the emergency room in January 2019 when he was being hospitalized for shortness of breath and chest pain.  During his ER evaluation he was noted to have interstitial lung disease.   Today his son is with him and provides most of the history.  He says that they have noticed increasing shortness of breath and cough with some mucus production for the last 6-7 months.  He says that sometimes he will have a burning sensation in his chest when he has this.  He now can walk no further than about 25-30 yards without having to stop.  He can no longer climb a flight of stairs or carrying in groceries because of his shortness of breath.  He also notes significant fatigue.  He says that he falls asleep very easily.  He had CPAP many years ago but he says "they took it away".  And he has not used it since.  He sleeps with oxygen now.  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  He retired from the city of [place] years ago.  He used to smoke but he quit smoking approximately 15 or 20 years ago.  He says that he would only smoke 1 or 2 cigarettes a day for many years.  He continues to drink alcohol periodically.  While he was hospitalized we performed a barium swallow test that showed some slow motility.  GI medicine saw him and felt that no further workup was necessary.  During that hospitalization cardiology  saw him and performed an echocardiogram which is essentially normal.  Past Medical History:  Diagnosis Date  . Arthritis   . Chest pain    with activity  . CHF (congestive heart failure) ([place])   . COPD (chronic obstructive pulmonary disease) ([place])   . Diabetes mellitus    type 2   . Dyspnea    home oxygen 3 liters prn, sob with activity, chronic interstitial fibrosis  . GERD (gastroesophageal reflux disease)   . Hyperlipidemia   . Hypertension   . Neuropathy   . Obesity   . Phimosis      Family History  Problem Relation Age of Onset  . Cancer Mother      Social History   Socioeconomic History  . Marital status: Widowed    Spouse name: Not on file  . Number of children: 10  . Years of education: 52  . Highest education level: Not on file  Social Needs  . Financial resource strain: Not on file  . Food insecurity - worry: Not on file  . Food insecurity - inability: Not on file  . Transportation needs - medical: Not on file  . Transportation needs - non-medical: Not on file  Occupational History    Comment: preacher  Tobacco Use  . Smoking status: Former Smoker    Years:  4.00    Types: Cigarettes    Last attempt to quit: 12/30/1996    Years since quitting: 20.7  . Smokeless tobacco: Never Used  . Tobacco comment: social  Substance and Sexual Activity  . Alcohol use: No  . Drug use: No  . Sexual activity: No  Other Topics Concern  . Not on file  Social History Narrative   Patient is right handed and resides with daughter, consumes occas. Sodas.     No Known Allergies   Outpatient Medications Prior to Visit  Medication Sig Dispense Refill  . acetaminophen (TYLENOL) 500 MG tablet Take 1 tablet (500 mg total) by mouth every 6 (six) hours as needed. 30 tablet 0  . cephALEXin (KEFLEX) 500 MG capsule Take 500 mg by mouth 2 (two) times daily.    . ciprofloxacin (CIPRO) 500 MG tablet Take 500 mg by mouth 2 (two) times daily.    . clotrimazole (LOTRIMIN) 1 % cream  Apply 1 application topically 2 (two) times daily.    . fluconazole (DIFLUCAN) 150 MG tablet Take 150 mg by mouth daily.    . furosemide (LASIX) 40 MG tablet Take 40 mg by mouth daily.     Marland Kitchen HYDROcodone-acetaminophen (NORCO) 10-325 MG per tablet Take 1 tablet by mouth every 6 (six) hours as needed. (Patient taking differently: Take 1 tablet by mouth 4 (four) times daily. ) 10 tablet 0  . insulin glargine (LANTUS) 100 UNIT/ML injection Inject 0.15 mLs (15 Units total) into the skin at bedtime. (Patient taking differently: Inject 15 Units into the skin at bedtime. ) 10 mL 11  . ketoconazole (NIZORAL) 2 % cream Apply 1 application topically daily.    Marland Kitchen ketoconazole (NIZORAL) 200 MG tablet Take 200 mg by mouth daily.    Marland Kitchen losartan (COZAAR) 50 MG tablet Take 50 mg by mouth daily.    . metroNIDAZOLE (FLAGYL) 500 MG tablet Take 500 mg by mouth 2 (two) times daily.    . nitroGLYCERIN (NITROSTAT) 0.4 MG SL tablet Place 1 tablet (0.4 mg total) under the tongue every 5 (five) minutes x 3 doses as needed for chest pain. 25 tablet 1  . nystatin (NYSTATIN) powder Apply 1 g topically 3 (three) times daily.    . OXYGEN Inhale into the lungs. 3 liters prn    . PROAIR HFA 108 (90 BASE) MCG/ACT inhaler Inhale 1 puff into the lungs every 4 (four) hours as needed for wheezing or shortness of breath.     . tamsulosin (FLOMAX) 0.4 MG CAPS capsule Take 0.4 mg by mouth 2 (two) times daily.    . traMADol (ULTRAM) 50 MG tablet Take 1 tablet (50 mg total) by mouth every 6 (six) hours as needed. 8 tablet 0  . atorvastatin (LIPITOR) 80 MG tablet Take 1 tablet (80 mg total) by mouth daily at 6 PM. 30 tablet 0  . ipratropium-albuterol (DUONEB) 0.5-2.5 (3) MG/3ML SOLN Take 3 mLs by nebulization 2 (two) times daily. 180 mL 0  . pantoprazole (PROTONIX) 40 MG tablet Take 1 tablet (40 mg total) by mouth 2 (two) times daily. 60 tablet 0   No facility-administered medications prior to visit.     Review of Systems  Constitutional:  Positive for malaise/fatigue. Negative for chills, fever and weight loss.  HENT: Negative for congestion, nosebleeds, sinus pain and sore throat.   Eyes: Negative for photophobia, pain and discharge.  Respiratory: Positive for cough, sputum production and shortness of breath. Negative for hemoptysis and wheezing.   Cardiovascular:  Positive for leg swelling. Negative for chest pain, palpitations and orthopnea.  Gastrointestinal: Negative for abdominal pain, constipation, diarrhea, nausea and vomiting.  Genitourinary: Negative for dysuria, frequency, hematuria and urgency.  Musculoskeletal: Negative for back pain, joint pain, myalgias and neck pain.  Skin: Negative for itching and rash.  Neurological: Negative for tingling, tremors, sensory change, speech change, focal weakness, seizures, weakness and headaches.  Psychiatric/Behavioral: Negative for memory loss, substance abuse and suicidal ideas. The patient is not nervous/anxious.       Objective:  Physical Exam   Vitals:   09/13/17 1120 09/13/17 1121  BP:  140/80  Pulse:  97  SpO2:  100%  Weight: 255 lb (115.7 kg)   Height: 5' 8.25" (1.734 m)     RA  Gen: chronically ill appearing, no acute distress HENT: NCAT, OP clear, neck supple without masses Eyes: PERRL, EOMi Lymph: no cervical lymphadenopathy PULM: Crackles throughout B CV: RRR, no mgr, no JVD GI: BS+, soft, nontender, no hsm Derm: no rash or skin breakdown MSK: normal bulk and tone Neuro: A&Ox4, CN II-XII intact, strength 5/5 in all 4 extremities Psyche: normal mood and affect   CBC    Component Value Date/Time   WBC 7.6 09/06/2017 1222   RBC 5.28 09/06/2017 1222   HGB 16.8 09/06/2017 1222   HCT 49.9 09/06/2017 1222   PLT 190 09/06/2017 1222   MCV 94.5 09/06/2017 1222   MCH 31.8 09/06/2017 1222   MCHC 33.7 09/06/2017 1222   RDW 14.2 09/06/2017 1222   LYMPHSABS 2.3 06/10/2017 1345   MONOABS 0.4 06/10/2017 1345   EOSABS 0.1 06/10/2017 1345   BASOSABS  0.0 06/10/2017 1345     Chest imaging: Jan 2019 CT chest images reviewed showing upper lobe predominant pulmonary fibrosis chagnes, specifically areas of honey combing and reticulation; the changes are peripheral based and worse in the upper lobes; esophagus is patulous  PFT:  Labs: January 2019 labs: ANA negative, ANCA negative, anti-Jo 1 negative, CCP antibody negative, double-stranded DNA negative, glomerular basement membrane antibody negative, myeloperoxidase negative, rheumatoid factor negative, SSA negative, SSB negative, SCL 70 negative  Path:  Echo: January 2019 transthoracic echocardiogram LVEF 55-60% mild LVH, normal RV size and function  Heart Catheterization:  Other imaging: January 2019 Barium swallow:  IMPRESSION Mildly dilated esophagus with moderate esophageal dysmotility.  No fixed esophageal narrowing or stricture.       Assessment & Plan:   ILD (interstitial lung disease) ([place]) - Plan: Hypersensitivity Pneumonitis, Centromere Antibodies  OSA (obstructive sleep apnea) - Plan: Ambulatory Referral for DME  IPF (idiopathic pulmonary fibrosis) ([place])  Discussion: Tal is here to follow-up with me after we initially saw him while hospitalized at [facility] in January 2019.  He has a CT scan of his chest which shows interstitial changes like honeycombing in the periphery which has an upper lobe predominant distribution.  There is some sparing of lung tissue throughout as well.  In general given his age and the appearance of the CT scan this seems consistent with UIP.  He has IPF because he does not have evidence of an underlying connective tissue disease.  Lab work was performed while hospitalized which did not show evidence of an underlying connective tissue disease nor is there any other evidence to suggest that as well.  He also had a barium swallow test which was performed which showed some degree of the mildly dilated esophagus and mild dysmotility but GI  medicine felt that there is no evidence of  a stricture which would explain chronic aspiration as a potential cause of his interstitial lung disease.  So in summary, he has idiopathic pulmonary fibrosis.  I suppose a differential diagnosis includes chronic hypersensitivity pneumonitis given the upper lobe predominance of findings on the CT scan of his chest but he has no clinical history to support this.  I think it is best to start him on Ofev.  He is not a surgical candidate to perform an open lung biopsy.  I am going to ask that he be seen in our interstitial lung disease clinic here in [place].  Interstitial lung disease: This means that on the CT scan of your chest which was performed in January 2019 we found scarring throughout your lungs.  This condition can come from many different problems but in your case we think that it is a condition called idiopathic pulmonary fibrosis. I think the best approach moving forward is to start a medicine called Ofev as to help slow the progression of the disease.  This medicine is taken twice a day.  It can slightly increase your bleeding risk so you should not take the medicine if you are going to have surgery.  It can also cause rash and sometimes an upset stomach.  It will take [place] some time to get approval for the medicine.  We will write a prescription for 150 mg twice a day.  I also think it is important for you to be seen by our interstitial lung disease clinic so I am going to refer you for a second opinion to see Dr. Chase Caller in the interstitial lung disease visit here.  30-minute visit only. We will check some more blood work today and we will have you follow-up in the interstitial lung disease clinic in 4-6 weeks.  Obstructive sleep apnea: We will arrange for a another CPAP machine We will set up a ResMed 10 auto titrating device which will titrate pressure between 5 and 20 cm of water  > start Ofev > check anti-centromere and hypersensitivity  pneumonitis panel to complete serologic work up > follow up 4-6 weeks.  > 25 minutes spent with patient, 45 minute visit.   Current Outpatient Medications:  .  acetaminophen (TYLENOL) 500 MG tablet, Take 1 tablet (500 mg total) by mouth every 6 (six) hours as needed., Disp: 30 tablet, Rfl: 0 .  cephALEXin (KEFLEX) 500 MG capsule, Take 500 mg by mouth 2 (two) times daily., Disp: , Rfl:  .  ciprofloxacin (CIPRO) 500 MG tablet, Take 500 mg by mouth 2 (two) times daily., Disp: , Rfl:  .  clotrimazole (LOTRIMIN) 1 % cream, Apply 1 application topically 2 (two) times daily., Disp: , Rfl:  .  fluconazole (DIFLUCAN) 150 MG tablet, Take 150 mg by mouth daily., Disp: , Rfl:  .  furosemide (LASIX) 40 MG tablet, Take 40 mg by mouth daily. , Disp: , Rfl:  .  HYDROcodone-acetaminophen (NORCO) 10-325 MG per tablet, Take 1 tablet by mouth every 6 (six) hours as needed. (Patient taking differently: Take 1 tablet by mouth 4 (four) times daily. ), Disp: 10 tablet, Rfl: 0 .  insulin glargine (LANTUS) 100 UNIT/ML injection, Inject 0.15 mLs (15 Units total) into the skin at bedtime. (Patient taking differently: Inject 15 Units into the skin at bedtime. ), Disp: 10 mL, Rfl: 11 .  ketoconazole (NIZORAL) 2 % cream, Apply 1 application topically daily., Disp: , Rfl:  .  ketoconazole (NIZORAL) 200 MG tablet, Take 200 mg by mouth daily.,  Disp: , Rfl:  .  losartan (COZAAR) 50 MG tablet, Take 50 mg by mouth daily., Disp: , Rfl:  .  metroNIDAZOLE (FLAGYL) 500 MG tablet, Take 500 mg by mouth 2 (two) times daily., Disp: , Rfl:  .  nitroGLYCERIN (NITROSTAT) 0.4 MG SL tablet, Place 1 tablet (0.4 mg total) under the tongue every 5 (five) minutes x 3 doses as needed for chest pain., Disp: 25 tablet, Rfl: 1 .  nystatin (NYSTATIN) powder, Apply 1 g topically 3 (three) times daily., Disp: , Rfl:  .  OXYGEN, Inhale into the lungs. 3 liters prn, Disp: , Rfl:  .  PROAIR HFA 108 (90 BASE) MCG/ACT inhaler, Inhale 1 puff into the lungs  every 4 (four) hours as needed for wheezing or shortness of breath. , Disp: , Rfl:  .  tamsulosin (FLOMAX) 0.4 MG CAPS capsule, Take 0.4 mg by mouth 2 (two) times daily., Disp: , Rfl:  .  traMADol (ULTRAM) 50 MG tablet, Take 1 tablet (50 mg total) by mouth every 6 (six) hours as needed., Disp: 8 tablet, Rfl: 0 .  atorvastatin (LIPITOR) 80 MG tablet, Take 1 tablet (80 mg total) by mouth daily at 6 PM., Disp: 30 tablet, Rfl: 0 .  ipratropium-albuterol (DUONEB) 0.5-2.5 (3) MG/3ML SOLN, Take 3 mLs by nebulization 2 (two) times daily., Disp: 180 mL, Rfl: 0 .  pantoprazole (PROTONIX) 40 MG tablet, Take 1 tablet (40 mg total) by mouth 2 (two) times daily., Disp: 60 tablet, Rfl: 0

## 2017-09-13 NOTE — Progress Notes (Signed)
Patient completed full PFT today. 

## 2017-09-17 LAB — HYPERSENSITIVITY PNEUMONITIS
A. Pullulans Abs: NEGATIVE
A.Fumigatus #1 Abs: NEGATIVE
Micropolyspora faeni, IgG: NEGATIVE
Pigeon Serum Abs: NEGATIVE
Thermoact. Saccharii: NEGATIVE
Thermoactinomyces vulgaris, IgG: NEGATIVE

## 2017-09-17 LAB — CENTROMERE ANTIBODIES

## 2017-09-19 ENCOUNTER — Telehealth: Payer: Self-pay

## 2017-09-19 NOTE — Telephone Encounter (Signed)
Received PA request from Ofev. I have completed form to the best of my ability. Form has been given to [place] to be completed and signed by BQ.  Will route to [place] for f/u.

## 2017-09-25 NOTE — Telephone Encounter (Signed)
Vern called from [employer] - states that a patient assistance enrollment application was faxed to [place] - she is checking the status - she is also going to refax to ensure we got the full application - She can be reached at [phone]

## 2017-09-28 NOTE — Telephone Encounter (Signed)
Called the number for [facility] given below, was advised via the automated system that the PA was in progress. Spoke to a rep who stated that additional information was needed. Advised her to fax over the form to the triage fax. Will await for paperwork to see what is needed.

## 2017-09-28 NOTE — Telephone Encounter (Signed)
[name] [employer] is calling about OFEV 150mg  she needs a  prior authorization [id number] case #[id number]

## 2017-09-28 NOTE — Telephone Encounter (Signed)
Received the form and filled it out. BQ signed the form. Will fax back to [employer] and give the original to [place] in case it is needed again.

## 2017-10-01 NOTE — Telephone Encounter (Signed)
Received paperwork from [employer] and they denied coverage for OFEV 150mg   because the physician did not support approval under Medicare Part D benefit. There was no confirmation that the FVC was greater than or equal to 50 percent of predicted value. If you want to appeal this decision, it should be faxed to the number below.   Fas: [phone] Phone [phone] or [phone]

## 2017-10-02 NOTE — Telephone Encounter (Signed)
Appeal, this time please include last PFT

## 2017-10-02 NOTE — Telephone Encounter (Signed)
Ok. Faxed over appeal letter with PFT attached to the number listed below to [employer]. Will await decision and will follow up with [facility]

## 2017-10-04 ENCOUNTER — Other Ambulatory Visit: Payer: Self-pay

## 2017-10-04 DIAGNOSIS — G4733 Obstructive sleep apnea (adult) (pediatric): Secondary | ICD-10-CM

## 2017-10-10 NOTE — Telephone Encounter (Signed)
Zee from [employer] is calling Caryl Pina back [phone]

## 2017-10-10 NOTE — Telephone Encounter (Signed)
Spoke with [employer] with [employer]. She stated that per the patient's chart, they were following up on the status of the patient assistance forms for Ofev. They received incomplete forms on 09/25/17 and have been requesting the completed forms ever since.   Looked in [place] cubby, do not see the original forms that were faxed. Spoke with Caryl Pina, she is not aware of any PA forms.   Will re-do the Ofev form and have BQ and the patient sign in the appropriate locations.   Heather verbalized understanding. Nothing else needed at time of call.

## 2017-10-11 NOTE — Telephone Encounter (Signed)
Initiate level 2 appeal protocol

## 2017-10-11 NOTE — Telephone Encounter (Signed)
BQ please advise.  

## 2017-10-11 NOTE — Telephone Encounter (Signed)
[name] from [place], [place] is [phone], states level 1 appeal was denied by insurance for Ofev on 10/04/17.  Checking to see if we were going to submit level 2 appeal?

## 2017-10-12 NOTE — Telephone Encounter (Addendum)
Received papers from [place] with pt, he states he will come here on Monday to sign papers and drop off his income verification. I will fill out and put in BQ box to sign, then we can fax them back to [employer].  Follow up [facility]

## 2017-10-12 NOTE — Telephone Encounter (Signed)
I called [employer] and spoke with a rep that advised me to send in the application for Ofev with additional documents. They state they have an enrollment form but need more information such as income and signature from a physician. The denial process from the insurance needs to be included as well so pt can receive his medication for free. He re-faxed the forms to our fax machine so I will complete them and call patient because he will have to sign them as well.

## 2017-10-16 NOTE — Telephone Encounter (Signed)
Forms are still in brown accordion folder for signature

## 2017-10-22 NOTE — Telephone Encounter (Signed)
Pt is coming to see MR tomorrow, 4/23 in the [facility]. Will have pt sign forms when he comes for the visit.

## 2017-10-23 ENCOUNTER — Ambulatory Visit: Payer: Medicare Other | Admitting: Internal Medicine

## 2017-10-24 ENCOUNTER — Ambulatory Visit (HOSPITAL_BASED_OUTPATIENT_CLINIC_OR_DEPARTMENT_OTHER): Payer: Medicare Other | Attending: Pulmonary Disease | Admitting: Pulmonary Disease

## 2017-10-24 VITALS — Ht 70.5 in | Wt 265.0 lb

## 2017-10-24 DIAGNOSIS — Z6837 Body mass index (BMI) 37.0-37.9, adult: Secondary | ICD-10-CM | POA: Insufficient documentation

## 2017-10-24 DIAGNOSIS — E119 Type 2 diabetes mellitus without complications: Secondary | ICD-10-CM | POA: Insufficient documentation

## 2017-10-24 DIAGNOSIS — I10 Essential (primary) hypertension: Secondary | ICD-10-CM | POA: Insufficient documentation

## 2017-10-24 DIAGNOSIS — E669 Obesity, unspecified: Secondary | ICD-10-CM | POA: Diagnosis not present

## 2017-10-24 DIAGNOSIS — G4733 Obstructive sleep apnea (adult) (pediatric): Secondary | ICD-10-CM | POA: Diagnosis present

## 2017-10-24 NOTE — Telephone Encounter (Signed)
It appears that pt no showed 10/24/17 apt with MR. Lmtcb, as [employer] forms are still up front for signature.

## 2017-10-25 ENCOUNTER — Telehealth: Payer: Self-pay | Admitting: Internal Medicine

## 2017-10-25 NOTE — Telephone Encounter (Signed)
Attempted to call Judeen Hammans back with [facility] [phone]  [phone] but did not get an answer, left message for her to call back.   Will route to MR to see if he would like to continue appeal for Ofev.   MR please advise. Thank you!

## 2017-10-30 ENCOUNTER — Encounter: Payer: Self-pay | Admitting: Internal Medicine

## 2017-10-30 ENCOUNTER — Ambulatory Visit (INDEPENDENT_AMBULATORY_CARE_PROVIDER_SITE_OTHER): Payer: Medicare Other | Admitting: Internal Medicine

## 2017-10-30 VITALS — BP 130/80 | HR 81 | Ht 70.0 in | Wt 244.6 lb

## 2017-10-30 DIAGNOSIS — Z77120 Contact with and (suspected) exposure to mold (toxic): Secondary | ICD-10-CM

## 2017-10-30 DIAGNOSIS — J679 Hypersensitivity pneumonitis due to unspecified organic dust: Secondary | ICD-10-CM | POA: Diagnosis not present

## 2017-10-30 DIAGNOSIS — J849 Interstitial pulmonary disease, unspecified: Secondary | ICD-10-CM

## 2017-10-30 MED ORDER — PREDNISONE 10 MG PO TABS: ORAL_TABLET | ORAL | 0 refills | Status: AC

## 2017-10-30 NOTE — Progress Notes (Signed)
Subjective:     Patient ID: [name], male   DOB: 08/10/1929, 84 y.o.   MRN: [id number]  HPI Name: [name] MRN:   [id number] DOB:   Nov 15, 1929           ADMISSION DATE:  07/27/2017 CONSULTATION DATE:  1/26  REFERRING MD :  TRH  CHIEF COMPLAINT:  IPF  BRIEF PATIENT DESCRIPTION: 84yo male former smoker (quit 20 years ago) with hx HTN, CHF, DM presented 1/25 with 1 day hx dyspnea, DOE and pleuritic chest pain.  Was apparently significantly SOB at urologists office who directed him to ER.  Admitted by [facility] and w/u neg for PE (neg CTA chest and neg BLE dopplers) but CT chest was concerning for chronic interstitial changes and PCCM consulted. No known fam hx autoimmune disease . Was Engineer, manufacturing, drove garbage truck.    SIGNIFICANT EVENTS  CTA chest 1/26>>> neg for PE, Chronic interstitial fibrosis with honeycomb changes  Results for [name], [name] (MRN [id number]) as of 07/31/2017 08:44  Ref. Range 07/28/2017 15:21  ANA Ab, IFA Unknown Negative  Anti JO-1 Latest Ref Range: 0.0 - 0.9 AI <0.2  RA Latex Turbid. Latest Ref Range: 0.0 - 13.9 IU/mL 11.1  SSA (Ro) (ENA) Antibody, IgG Latest Ref Range: 0.0 - 0.9 AI <0.2  SSB (La) (ENA) Antibody, IgG Latest Ref Range: 0.0 - 0.9 AI <0.2  Scleroderma (Scl-70) (ENA) Antibody, IgG Latest Ref Range: 0.0 - 0.9 AI <0.2  Results for [name], [name] (MRN [id number]) as of 07/31/2017 08:44  Ref. Range 07/03/2012 00:37 08/11/2014 11:39 07/28/2017 15:21  Sed Rate Latest Ref Range: 0 - 16 mm/hr 18 (H) 9 22 (H)    SUBJECTIVE:   SUBJECTIVE/OVERNIGHT/INTERVAL HX 07/31/2017 - denies any knowledge of chronic ILD. Review of images shows cxr changes of ILD since 2006/2008 and on CT chest since 2011 atleast. Unclear if worse over time. Per Cards: on echo no evidence of Pulm Htn and ef is good. Patient denies prior dx of sarcoidosis (he is ethnic african [place]). He has been on RA here in bed    VITAL SIGNS: Temp:  [97.5 F (36.4  C)-98.5 F (36.9 C)] 98 F (36.7 C) (01/29 0749) Pulse Rate:  [78-95] 81 (01/29 0749) Resp:  [14-27] 15 (01/29 0526) BP: (93-136)/(61-91) 109/73 (01/29 0749) SpO2:  [93 %-100 %] 98 % (01/29 0856) FiO2 (%):  [2 %] 2 % (01/28 1555) Weight:  [112.1 kg (247 lb 3.2 oz)] 112.1 kg (247 lb 3.2 oz) (01/29 0526)  PHYSICAL EXAMINATION: 3 General Appearance:    obes  Head:    Normocephalic, without obvious abnormality, atraumatic  Eyes:    PERRL - yes, conjunctiva/corneas - clear      Ears:    Normal external ear canals, both ears  Nose:   NG tube - no  Throat:  ETT TUBE - no , OG tube - no  Neck:   Supple,  No enlargement/tenderness/nodules     Lungs:     Scattered crackles  Chest wall:    No deformity  Heart:    S1 and S2 normal, no murmur, CVP - no.  Pressors - no  Abdomen:     Soft, no masses, no organomegaly  Genitalia:    Not done  Rectal:   not done  Extremities:   Extremities- inntact     Skin:   Intact in exposed areas . Sacral area - no decub     Neurologic:   Sedation - none ->  RASS - +1 . Moves all 4s - yes. CAM-ICU - neg  . Orientation - x3+     PULMONARY  LastLabs  No results for input(s): PHART, PCO2ART, PO2ART, HCO3, TCO2, O2SAT in the last 168 hours.  Invalid input(s): PCO2, PO2    CBC LastLabs       Recent Labs  Lab 07/27/17 1934 07/28/17 0626 07/29/17 0738  HGB 16.2 16.3 15.8  HCT 48.2 48.7 48.0  WBC 7.9 8.2 6.4  PLT 185 179 176      COAGULATION LastLabs  No results for input(s): INR in the last 168 hours.    CARDIAC   LastLabs      Recent Labs  Lab 07/28/17 0626 07/28/17 1520  TROPONINI <0.03 <0.03     LastLabs  No results for input(s): PROBNP in the last 168 hours.     CHEMISTRY LastLabs       Recent Labs  Lab 07/27/17 1449 07/29/17 0738 07/31/17 0437  NA 137 138 137  K 4.7 4.0 4.1  CL 103 100* 102  CO2 23 29 27   GLUCOSE 167* 180* 275*  BUN 10 10 12   CREATININE 1.16 1.19 1.14  CALCIUM  9.0 8.7* 8.4*     Estimated Creatinine Clearance: 59.4 mL/min (by C-G formula based on SCr of 1.14 mg/dL).   LIVER LastLabs  No results for input(s): AST, ALT, ALKPHOS, BILITOT, PROT, ALBUMIN, INR in the last 168 hours.     INFECTIOUS LastLabs  No results for input(s): LATICACIDVEN, PROCALCITON in the last 168 hours.     ENDOCRINE CBG (last 3)  RecentLabs(last2labs)       Recent Labs    07/30/17 1551 07/30/17 2008 07/31/17 0747  GLUCAP 276* 217* 217*           IMAGING x48h  - image(s) personally visualized  -   highlighted in bold  ImagingResults(Last48hours)  Dg Esophagus  Result Date: 07/30/2017 CLINICAL DATA:  Chest pain, patulous esophagus on CT EXAM: ESOPHOGRAM/BARIUM SWALLOW TECHNIQUE: Single contrast examination was performed using  thin barium. FLUOROSCOPY TIME:  Fluoroscopy Time:  42 seconds Radiation Exposure Index (if provided by the fluoroscopic device): 24.3 mGy Number of Acquired Spot Images: 6 COMPARISON:  None. FINDINGS: Given patient's condition, limited evaluation was performed in semi recumbent supine position. Mildly dilated, fluid-filled esophagus. Moderate esophageal dysmotility. No fixed esophageal narrowing or stricture. A barium tablet was not administered given dysmotility. Patient was not assessed for gastroesophageal reflux. IMPRESSION: Mildly dilated esophagus with moderate esophageal dysmotility. No fixed esophageal narrowing or stricture. Electronically Signed   By: Julian Hy M.D.   On: 07/30/2017 08:37   Nm Myocar Multi W/spect W/wall Motion / Ef  Result Date: 07/30/2017 CLINICAL DATA:  84 year old with pleuritic chest pain. History of hypertension, diastolic congestive heart failure and diabetes. Former smoker. EXAM: MYOCARDIAL IMAGING WITH SPECT (REST AND PHARMACOLOGIC-STRESS) GATED LEFT VENTRICULAR WALL MOTION STUDY LEFT VENTRICULAR EJECTION FRACTION TECHNIQUE: Standard myocardial SPECT imaging was  performed after resting intravenous injection of 10 mCi Tc-61mtetrofosmin. Subsequently, intravenous infusion of Lexiscan was performed under the supervision of the Cardiology staff. At peak effect of the drug, 30 mCi Tc-963metrofosmin was injected intravenously and standard myocardial SPECT imaging was performed. Quantitative gated imaging was also performed to evaluate left ventricular wall motion, and estimate left ventricular ejection fraction. COMPARISON:  Chest CTA 07/28/2017. Nuclear medicine myocardial scan 08/30/2013. FINDINGS: Perfusion: Heterogeneous myocardial activity, likely in part secondary to motion. There are areas of decreased activity at the apex and in  the distal septum, inferior and lateral walls. Overall activity appears slightly worse on the rest images, although mild reversibility in the distal inferior wall difficult to exclude. The anterior wall activity appears normal. Wall Motion: Suboptimally evaluated due to technical limitations. No focal wall motion abnormality identified. Left Ventricular Ejection Fraction: 42 % End diastolic volume 63 ml End systolic volume 37 ml IMPRESSION: 1. Limited study secondary to heterogeneous myocardial activity. This may be secondary to patient motion. Difficult to exclude reversibility in the distal inferior wall. 2. Mild global hypokinesis, similar to previous study. 3. Left ventricular ejection fraction 42%.  Same as previous study. 4. Non invasive risk stratification*: Moderate *2012 Appropriate Use Criteria for Coronary Revascularization Focused Update: J Am Coll Cardiol. 3754;36(0):677-034. http://content.airportbarriers.com.aspx?articleid=[email or web address] Electronically Signed   By: Richardean Sale M.D.   On: 07/30/2017 14:27           ASSESSMENT / PLAN:   ILD (interstitial lung disease) ([place]) - Patulous  Esophagus will suggest scleroderma but no clinical evidence for this and scl-70 is negaive - Issue appears chronic on CT  atleast since 2011 and on CXR since 2006; hard to discern if progressive. He is not aware of his ILD dx and denies dyspnea many years ago. For him this is  An issue only past few months - Autoimmune antibody - negative so far  - Ddx: UIP v Sarcoid Stage 4 (ude to slight UL predmoniance) v Chronic HP  Plan - await HP panel = check gbm, anca, ace - check ambulator pulse ox on RA for discharge - opd fu with Dr Lake Bells who did original consult in hospital; can be referred to ILD clinic depending on his followup eval of complexity - - Wil send message to his CMA AShley to set up HRCT and spirometry priot to appt with Dr Lake Bells   Future Appointments  Date Time Provider [place]  09/13/2017 11:30 AM Juanito Doom, MD [facility] None     Dr. Brand Males, M.D., [facility].C.P Pulmonary and Critical Care Medicine Staff Physician, [place] Director - Interstitial Lung Disease  Program  Pulmonary [place] at [place], [place], [address]  Pager: [phone], If no answer or between  15:00h - 7:00h: call [phone] Telephone: [phone]               OV 09/13/17 - Dr Lake Bells         Chief Complaint  Patient presents with  . Follow-up    Hospital on 09/08/17- Says he is still having trouble breathing through out the day and is sleeping alot not getting out of bed much.   This is a pleasant 84 year old male whom I met in the emergency room in January 2019 when he was being hospitalized for shortness of breath and chest pain.  During his ER evaluation he was noted to have interstitial lung disease.   Today his son is with him and provides most of the history.  He says that they have noticed increasing shortness of breath and cough with some mucus production for the last 6-7 months.  He says that sometimes he will have a burning sensation in his chest when he has this.  He now can walk no  further than about 25-30 yards without having to stop.  He can no longer climb a flight of stairs or carrying in groceries because of his shortness of breath.  He also notes significant fatigue.  He says that he falls asleep very easily.  He had CPAP many years ago but he says "they took it away".  And he has not used it since.  He sleeps with oxygen now.  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  He retired from the city of [place] years ago.  He used to smoke but he quit smoking approximately 15 or 20 years ago.  He says that he would only smoke 1 or 2 cigarettes a day for many years.  He continues to drink alcohol periodically.  While he was hospitalized we performed a barium swallow test that showed some slow motility.  GI medicine saw him and felt that no further workup was necessary.  During that hospitalization cardiology saw him and performed an echocardiogram which is essentially normal. Results for [name], [name] (MRN [id number]) as of 10/30/2017 16:28  Ref. Range 09/13/2017 00:00  CENTROMERE AB SCREEN Latest Ref Range: 0.0 - 0.9 AI <0.2  A.Fumigatus #1 Abs Latest Ref Range: Negative  Negative  Micropolyspora faeni, IgG Latest Ref Range: Negative  Negative  Thermoactinomyces vulgaris, IgG Latest Ref Range: Negative  Negative  A. Pullulans Abs Latest Ref Range: Negative  Negative  Thermoact. Saccharii Latest Ref Range: Negative  Negative  Pigeon Serum Abs Latest Ref Range: Negative  Negative  Results for [name], [name] (MRN [id number]) as of 10/30/2017 16:28  Ref. Range 09/13/2017 09:03  FVC-Pre Latest Units: L 1.34  FVC-%Pred-Pre Latest Units: % 40   Results for [name], [name] (MRN [id number]) as of 10/30/2017 16:28  Ref. Range 09/13/2017 09:03  FVC-Post Latest Units: L 1.26  FVC-%Pred-Post Latest Units: % 38  Results for [name], [name] (MRN  [id number]) as of 10/30/2017 16:28  Ref. Range 07/31/2017 09:27  ANCA Proteinase 3 Latest Ref Range: 0.0 - 3.5 U/mL <3.5  Angiotensin-Converting Enzyme Latest Ref Range: 14 - 82 U/L 24  CCP Antibodies IgG/IgA Latest Ref Range: 0 - 19 units 6  ds DNA Ab Latest Ref Range: 0 - 9 IU/mL 2  GBM Ab Latest Ref Range: 0 - 20 units 3  Myeloperoxidase Abs Latest Ref Range: 0.0 - 9.0 U/mL <9.0   IMPRESSION: 1. The appearance of the lungs is compatible with interstitial lung disease. CT features are most consistent with a non-IPF diagnosis, likely chronic hypersensitivity pneumonitis, as above. 2. Aortic atherosclerosis. 3. Additional incidental findings, as above.  Aortic Atherosclerosis (ICD10-I70.0).   Electronically Signed   By: Vinnie Langton M.D.   On: 09/07/2017 09:51      REC: START OFEV for IPF  OV 10/30/2017  Chief Complaint  Patient presents with  . Follow-up    BQ pt.  Pt has c/o cough, SOB, and CP. Pt becomes very SOB with little ambulation. Pt is supposed to be wearing 3L O2 but has not been wearing it like he should.     Now presents to ILD clinic  Reola Calkins: Care is been transferred to the interstitial lung disease clinic by Dr. Lake Bells.  I originally seen him in January 2019 in the hospital.  At that time the preliminary differential diagnosis was stage IV sarcoid versus chronic fibrotic hypersensitivity pneumonitis versus UIP/IPF.  Since that time he had extensive autoimmune and vascular risk profile and this was negative.  He followed up with Dr. Lake Bells in March 2019.  I reviewed that  note.  He had blood hypersensitivity pneumonitis panel checked and this was negative.  Therefore he was given a diagnosis of IPF and nintedanib was recommended.  However the nintedanib has not been approved.  In addition according to the nintedanib nurse educator patient has very poor appetite and is not eating and is generally noncompliant.  He is also reporting a lot of fatigue  and weight loss and therefore the concern is that nintedanib may not be a good fit for the patient.  In review of his history by reviewing the chart and also talking to the patient and his son who is here with him.  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  During this time he had intermittent mold exposure.  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  During this time he was intermittently exposed to mold.  He currently lives in the same house for 30 years.  This house was built in the 1970s.  The son says there is significant mold in the house.  The son himself starts coughing when he enters the house and he can feel a musty smell.  Patient's most recent high-resolution CT chest that I personally visualized and confirmed the findings show more like upper lobe honeycombing compared to basal disease and there is some amount of air trapping raising the possibility that this CT scan is indeterminate for UIP and not consistent with classic UIP and suggesting an alternate diagnosis of hypersensitivity pneumonitis.  Patient's main symptoms are shortness of breath but is noncompliant with the oxygen.  Is also having significant cough and he wants relief from this.  His main goal of care is symptom relief.  He does have diabetes but he is willing to take prednisone if needed.     has a past medical history of Arthritis, Chest pain, CHF (congestive heart failure) ([place]), COPD (chronic obstructive pulmonary disease) ([place]), Diabetes mellitus, Dyspnea, GERD (gastroesophageal reflux disease), Hyperlipidemia, Hypertension, Neuropathy, Obesity, and Phimosis.   reports that he quit smoking about 20 years ago. His smoking use included cigarettes. He has a 0.40 pack-year smoking history. He has never used smokeless tobacco.  Past Surgical History:  Procedure Laterality Date  .  ABDOMINAL SURGERY     pt has abdominal scarring and states he had sx but not sure what kind  . BACK SURGERY    . CHOLECYSTECTOMY    . CIRCUMCISION N/A 09/07/2017   Procedure: CIRCUMCISION;  Surgeon: Lucas Mallow, MD;  Location: [facility];  Service: Urology;  Laterality: N/A;    No Known Allergies   There is no immunization history on file for this patient.  Family History  Problem Relation Age of Onset  . Cancer Mother      Current Outpatient Medications:  .  acetaminophen (TYLENOL) 500 MG tablet, Take 1 tablet (500 mg total) by mouth every 6 (six) hours as needed., Disp: 30 tablet, Rfl: 0 .  clotrimazole (LOTRIMIN) 1 % cream, Apply 1 application topically 2 (two) times daily., Disp: , Rfl:  .  fluconazole (DIFLUCAN) 150 MG tablet, Take 150 mg by mouth daily., Disp: , Rfl:  .  furosemide (LASIX) 40 MG tablet, Take 40 mg by mouth daily. , Disp: , Rfl:  .  HYDROcodone-acetaminophen (NORCO) 10-325 MG per tablet, Take 1 tablet by mouth every 6 (six) hours as needed. (  Patient taking differently: Take 1 tablet by mouth 4 (four) times daily. ), Disp: 10 tablet, Rfl: 0 .  insulin glargine (LANTUS) 100 UNIT/ML injection, Inject 0.15 mLs (15 Units total) into the skin at bedtime. (Patient taking differently: Inject 15 Units into the skin at bedtime. ), Disp: 10 mL, Rfl: 11 .  ketoconazole (NIZORAL) 2 % cream, Apply 1 application topically daily., Disp: , Rfl:  .  ketoconazole (NIZORAL) 200 MG tablet, Take 200 mg by mouth daily., Disp: , Rfl:  .  losartan (COZAAR) 50 MG tablet, Take 50 mg by mouth daily., Disp: , Rfl:  .  metroNIDAZOLE (FLAGYL) 500 MG tablet, Take 500 mg by mouth 2 (two) times daily., Disp: , Rfl:  .  nystatin (NYSTATIN) powder, Apply 1 g topically 3 (three) times daily., Disp: , Rfl:  .  OXYGEN, Inhale into the lungs. 3 liters prn, Disp: , Rfl:  .  PROAIR HFA 108 (90 BASE) MCG/ACT inhaler, Inhale 1 puff into the lungs every 4 (four) hours as needed for wheezing or shortness of  breath. , Disp: , Rfl:  .  tamsulosin (FLOMAX) 0.4 MG CAPS capsule, Take 0.4 mg by mouth 2 (two) times daily., Disp: , Rfl:  .  traMADol (ULTRAM) 50 MG tablet, Take 1 tablet (50 mg total) by mouth every 6 (six) hours as needed., Disp: 8 tablet, Rfl: 0 .  atorvastatin (LIPITOR) 80 MG tablet, Take 1 tablet (80 mg total) by mouth daily at 6 PM., Disp: 30 tablet, Rfl: 0 .  ipratropium-albuterol (DUONEB) 0.5-2.5 (3) MG/3ML SOLN, Take 3 mLs by nebulization 2 (two) times daily., Disp: 180 mL, Rfl: 0 .  nitroGLYCERIN (NITROSTAT) 0.4 MG SL tablet, Place 1 tablet (0.4 mg total) under the tongue every 5 (five) minutes x 3 doses as needed for chest pain. (Patient not taking: Reported on 10/30/2017), Disp: 25 tablet, Rfl: 1 .  pantoprazole (PROTONIX) 40 MG tablet, Take 1 tablet (40 mg total) by mouth 2 (two) times daily., Disp: 60 tablet, Rfl: 0    Review of Systems     Objective:   Physical Exam  Vitals:   10/30/17 1617  BP: 130/80  Pulse: 81  SpO2: 92%  Weight: 244 lb 9.6 oz (110.9 kg)  Height: 5' 10"  (1.778 m)    Estimated body mass index is 35.1 kg/m as calculated from the following:   Height as of this encounter: 5' 10"  (1.778 m).   Weight as of this encounter: 244 lb 9.6 oz (110.9 kg).   General Appearance:    L OBESE - +  Head:    Normocephalic, without obvious abnormality, atraumatic  Eyes:    PERRL - yes, conjunctiva/corneas - clear      Ears:    Normal external ear canals, both ears  Nose:   NG tube - no  Throat:  ETT TUBE - no , OG tube - o  Neck:   Supple,  No enlargement/tenderness/nodules     Lungs:     Has scatterd crackles. Does not have bibasal crackles of UIP  Chest wall:    No deformity  Heart:    S1 and S2 normal, no murmur, CVP - no.  Pressors - no  Abdomen:     Soft, no masses, no organomegaly  Genitalia:    Not done  Rectal:   not done  Extremities:   Extremities- intact     Skin:   Intact in exposed areas .      Neurologic:   Sedation -  none -> RASS - 0 .  Moves all 4s - yes. CAM-ICU - neg . Orientation - x3 +         Assessment:       ICD-10-CM   1. 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 ILD (interstitial lung disease) ([place]) J84.9   3. 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 HP panel might be false negtive( Bx needed to differentiate but risky in 84 year old BAL an option but his goal is symptm and Hx and CT features are fairly ok to give dx of chronic HP  Plan No ofev - anyways you have GI side effects and anti-fibrotic not a good fit  - we will cancel application  Start prednisone 33m daily x 1 week, then 344mdaily x 1 weeks, then 2075maily x 1 weeks and then 19m17mily to continue (we discussed all side effects of prednisone)  Use oxygen with exertion and at night  See PCP AndeVonna DraftsP asap to get your sugars under control with prednisone  Get rid of mold in the house ASAP  Followup 1 month to ILD clinic or 30 min slot     > 50% of this > 40 min visit spent in face to face counseling or/and coordination of care    Dr. MuraBrand MalesD., F.C.[facility] Pulmonary and Critical Care Medicine Staff Physician, [facility] - Interstitial Lung Disease  Program  [place], [id number]: [phone] no answer or between  15:00h - 7:00h: call [phone] Telephone: [phone]

## 2017-10-30 NOTE — Telephone Encounter (Signed)
See 3/20 phone note- we are waiting on pt to come in to sign [employer] paperwork for this.  Will close this duplicate encounter.

## 2017-10-30 NOTE — Patient Instructions (Signed)
ICD-10-CM   1. 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 ILD (interstitial lung disease) ([place]) J84.9   3. 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

## 2017-10-30 NOTE — Telephone Encounter (Signed)
Dr Lake Bells patient who initiated ofev 09/13/17 and phone messages show he is working on appeal. Please send to him  Thanks  Dr. Brand Males, M.D., F.C.C.P Pulmonary and Critical Care Medicine Staff Physician, [place] Director - Interstitial Lung Disease  Program  Pulmonary [place] at [place], [place], [address]  Pager: [phone], If no answer or between  15:00h - 7:00h: call [phone] Telephone: [phone]

## 2017-11-01 DIAGNOSIS — G4733 Obstructive sleep apnea (adult) (pediatric): Secondary | ICD-10-CM

## 2017-11-01 NOTE — Progress Notes (Signed)
A, Please make sure he has an order for CPAP 12cm H20 with download in 4 weeks. Thanks, B

## 2017-11-01 NOTE — Telephone Encounter (Signed)
[name] from [place] [phone]  She is calling about the appeal

## 2017-11-01 NOTE — Procedures (Signed)
   Patient Name: Rondel, Episcopo Date: 10/24/2017   Gender: Male  D.O.B: 1929-11-17  Age (years): 17  Referring Provider: Simonne Maffucci  Height (inches): 23  Interpreting Physician: Chesley Mires MD, ABSM  Weight (lbs): 265  RPSGT: Jorge Ny  BMI: 37  MRN: [id number]  Neck Size: 17.50   CLINICAL INFORMATION  Sleep Study Type: Split Night CPAP Indication for sleep study: Diabetes, Hypertension, Obesity, OSA Epworth Sleepiness Score: 21 SLEEP STUDY TECHNIQUE  As per the AASM Manual for the Scoring of Sleep and Associated Events v2.3 (April 2016) with a hypopnea requiring 4% desaturations. The channels recorded and monitored were frontal, central and occipital EEG, electrooculogram (EOG), submentalis EMG (chin), nasal and oral airflow, thoracic and abdominal wall motion, anterior tibialis EMG, snore microphone, electrocardiogram, and pulse oximetry. Continuous positive airway pressure (CPAP) was initiated when the patient met split night criteria and was titrated according to treat sleep-disordered breathing. MEDICATIONS  Medications self-administered by patient taken the night of the study : N/A RESPIRATORY PARAMETERS  Diagnostic Total AHI (/hr): 38.0 RDI (/hr): 42.7 OA Index (/hr): 4.3 CA Index (/hr): 0.7  REM AHI (/hr): 35.4 NREM AHI (/hr): 39.1 Supine AHI (/hr): N/A Non-supine AHI (/hr): 38.03  Min O2 Sat (%): 79.0 Mean O2 (%): 89.2 Time below 88% (min): 56.8      Titration Optimal Pressure (cm): 12 AHI at Optimal Pressure (/hr): 0.0 Min O2 at Optimal Pressure (%): 88.0  Supine % at Optimal (%): 100 Sleep % at Optimal (%): 100      SLEEP ARCHITECTURE  The recording time for the entire night was 397.3 minutes. During a baseline period of 233.6 minutes, the patient slept for 165.6 minutes in REM and nonREM, yielding a sleep efficiency of 70.9%%. Sleep onset after lights out was 7.0 minutes with a REM latency of 164.5 minutes. The patient spent 16.0%% of the  night in stage N1 sleep, 55.3%% in stage N2 sleep, 0.0%% in stage N3 and 28.7%% in REM. During the titration period of 155.8 minutes, the patient slept for 107.0 minutes in REM and nonREM, yielding a sleep efficiency of 68.7%%. Sleep onset after CPAP initiation was 0.0 minutes with a REM latency of 68.0 minutes. The patient spent 9.8%% of the night in stage N1 sleep, 81.8%% in stage N2 sleep, 0.0%% in stage N3 and 8.4%% in REM. CARDIAC DATA  The 2 lead EKG demonstrated sinus rhythm. The mean heart rate was 100.0 beats per minute. Other EKG findings include: PVCs.  LEG MOVEMENT DATA  The total Periodic Limb Movements of Sleep (PLMS) were 0. The PLMS index was 0.0 . IMPRESSIONS  - Severe obstructive sleep apnea with an AHI of 38 and SpO2 low of 79%. - He did well with CPAP 12 cm H2O. He had central apnea with lower pressure settings. - He did not require supplemental oxygen during this study. DIAGNOSIS  - Obstructive Sleep Apnea (327.23 [G47.33 ICD-10]) RECOMMENDATIONS  - Trial of CPAP therapy on 12 cm H2O with a Medium size Resmed Full Face Mask AirFit F20 mask and heated humidification. [Electronically signed] 11/01/2017 08:15 AM Chesley Mires MD, [place], American Board of Sleep Medicine  NPI: [id number]

## 2017-11-08 NOTE — Telephone Encounter (Signed)
[name] at [employer], states level 1 appeal was denied. Gatha Mayer would like to know if we are moving forward to level 2 appeal. Cb is [phone]

## 2017-11-09 NOTE — Telephone Encounter (Signed)
We are not going through with the appeal. Dr. Chase Caller wanted to discontinue OFEV according to last office note. Nothing further is needed.     Patient Instructions by Brand Males, MD at 10/30/2017 4:00 PM  Author: Brand Males, MD Author Type: Physician Filed: 10/30/2017 4:51 PM  Note Status: Signed Cosign: Cosign Not Required Encounter Date: 10/30/2017  Editor: Brand Males, MD (Physician)      ICD-10-CM   1. 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 ILD (interstitial lung disease) ([place]) J84.9   3. 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

## 2017-11-13 ENCOUNTER — Telehealth: Payer: Self-pay | Admitting: Internal Medicine

## 2017-11-13 NOTE — Telephone Encounter (Signed)
Dr. Chase Caller do you want a second appeal done on pt's OFEV?

## 2017-11-16 ENCOUNTER — Encounter (HOSPITAL_COMMUNITY): Payer: Self-pay | Admitting: Emergency Medicine

## 2017-11-16 ENCOUNTER — Emergency Department (HOSPITAL_COMMUNITY): Payer: Medicare Other

## 2017-11-16 ENCOUNTER — Emergency Department (HOSPITAL_COMMUNITY)
Admission: EM | Admit: 2017-11-16 | Discharge: 2017-11-16 | Disposition: A | Discharge status: Home / Self Care | Payer: Medicare Other | Attending: Emergency Medicine | Admitting: Emergency Medicine

## 2017-11-16 DIAGNOSIS — I130 Hypertensive heart and chronic kidney disease with heart failure and stage 1 through stage 4 chronic kidney disease, or unspecified chronic kidney disease: Secondary | ICD-10-CM | POA: Diagnosis not present

## 2017-11-16 DIAGNOSIS — I509 Heart failure, unspecified: Secondary | ICD-10-CM | POA: Diagnosis not present

## 2017-11-16 DIAGNOSIS — Z87891 Personal history of nicotine dependence: Secondary | ICD-10-CM | POA: Diagnosis not present

## 2017-11-16 DIAGNOSIS — J449 Chronic obstructive pulmonary disease, unspecified: Secondary | ICD-10-CM | POA: Diagnosis not present

## 2017-11-16 DIAGNOSIS — E119 Type 2 diabetes mellitus without complications: Secondary | ICD-10-CM | POA: Insufficient documentation

## 2017-11-16 DIAGNOSIS — R109 Unspecified abdominal pain: Secondary | ICD-10-CM | POA: Diagnosis present

## 2017-11-16 DIAGNOSIS — K5900 Constipation, unspecified: Secondary | ICD-10-CM | POA: Diagnosis not present

## 2017-11-16 DIAGNOSIS — N182 Chronic kidney disease, stage 2 (mild): Secondary | ICD-10-CM | POA: Diagnosis not present

## 2017-11-16 LAB — I-STAT CG4 LACTIC ACID, ED
Lactic Acid, Venous: 2.57 mmol/L (ref 0.5–1.9)
Lactic Acid, Venous: 2.47 mmol/L (ref 0.5–1.9)

## 2017-11-16 LAB — CBC WITH DIFFERENTIAL/PLATELET
Basophils Absolute: 0 10*3/uL (ref 0.0–0.1)
Basophils Relative: 0 %
EOS ABS: 0.1 10*3/uL (ref 0.0–0.7)
EOS PCT: 0 %
HCT: 51.7 % (ref 39.0–52.0)
Hemoglobin: 18.1 g/dL — ABNORMAL HIGH (ref 13.0–17.0)
LYMPHS ABS: 2.6 10*3/uL (ref 0.7–4.0)
LYMPHS PCT: 22 %
MCH: 31.8 pg (ref 26.0–34.0)
MCHC: 35 g/dL (ref 30.0–36.0)
MCV: 90.7 fL (ref 78.0–100.0)
MONO ABS: 0.7 10*3/uL (ref 0.1–1.0)
MONOS PCT: 6 %
Neutro Abs: 8.3 10*3/uL — ABNORMAL HIGH (ref 1.7–7.7)
Neutrophils Relative %: 72 %
PLATELETS: 171 10*3/uL (ref 150–400)
RBC: 5.7 MIL/uL (ref 4.22–5.81)
RDW: 14.5 % (ref 11.5–15.5)
WBC: 11.6 10*3/uL — ABNORMAL HIGH (ref 4.0–10.5)

## 2017-11-16 LAB — URINALYSIS, ROUTINE W REFLEX MICROSCOPIC
BILIRUBIN URINE: NEGATIVE
Bacteria, UA: NONE SEEN
Glucose, UA: >=500 mg/dL — AB
Ketones, ur: 5 mg/dL — AB
Leukocytes, UA: NEGATIVE
Nitrite: NEGATIVE
PH: 6 (ref 5.0–8.0)
Protein, ur: 30 mg/dL — AB
SPECIFIC GRAVITY, URINE: 1.029 (ref 1.005–1.030)

## 2017-11-16 LAB — COMPREHENSIVE METABOLIC PANEL
ALK PHOS: 88 U/L (ref 38–126)
ALT: 22 U/L (ref 17–63)
AST: 24 U/L (ref 15–41)
Albumin: 3.2 g/dL — ABNORMAL LOW (ref 3.5–5.0)
Anion gap: 13 (ref 5–15)
BUN: 22 mg/dL — ABNORMAL HIGH (ref 6–20)
CALCIUM: 9.5 mg/dL (ref 8.9–10.3)
CO2: 27 mmol/L (ref 22–32)
CREATININE: 1.26 mg/dL — AB (ref 0.61–1.24)
Chloride: 93 mmol/L — ABNORMAL LOW (ref 101–111)
GFR calc non Af Amer: 50 mL/min — ABNORMAL LOW (ref >60)
GFR, EST AFRICAN AMERICAN: 58 mL/min — AB (ref >60)
Glucose, Bld: 483 mg/dL — ABNORMAL HIGH (ref 65–99)
Potassium: 5 mmol/L (ref 3.5–5.1)
SODIUM: 133 mmol/L — AB (ref 135–145)
Total Bilirubin: 0.5 mg/dL (ref 0.3–1.2)
Total Protein: 7.9 g/dL (ref 6.5–8.1)

## 2017-11-16 LAB — CBG MONITORING, ED: Glucose-Capillary: 471 mg/dL — ABNORMAL HIGH (ref 65–99)

## 2017-11-16 LAB — LIPASE, BLOOD: Lipase: 44 U/L (ref 11–51)

## 2017-11-16 MED ORDER — LACTATED RINGERS IV BOLUS
1000.0000 mL | Freq: Once | INTRAVENOUS | Status: AC
  Administered 2017-11-16: 1000 mL via INTRAVENOUS

## 2017-11-16 MED ORDER — DOCUSATE SODIUM 100 MG PO CAPS: 100.0000 mg | ORAL_CAPSULE | Freq: Two times a day (BID) | ORAL | 0 refills | Status: AC

## 2017-11-16 MED ORDER — MAGNESIUM HYDROXIDE 400 MG/5ML PO SUSP
960.0000 mL | Freq: Once | ORAL | Status: AC
  Administered 2017-11-16: 960 mL via RECTAL
  Filled 2017-11-16: qty 473

## 2017-11-16 MED ORDER — DOCUSATE SODIUM 100 MG PO CAPS
100.0000 mg | ORAL_CAPSULE | Freq: Once | ORAL | Status: AC
  Administered 2017-11-16: 100 mg via ORAL
  Filled 2017-11-16: qty 1

## 2017-11-16 MED ORDER — POLYETHYLENE GLYCOL 3350 17 G PO PACK: 17.0000 g | PACK | Freq: Two times a day (BID) | ORAL | 0 refills | Status: AC

## 2017-11-16 MED ORDER — POLYETHYLENE GLYCOL 3350 17 G PO PACK
17.0000 g | PACK | Freq: Every day | ORAL | Status: DC
  Administered 2017-11-16: 17 g via ORAL
  Filled 2017-11-16: qty 1

## 2017-11-16 MED ORDER — IOPAMIDOL (ISOVUE-300) INJECTION 61%
INTRAVENOUS | Status: AC
  Administered 2017-11-16: 18:00:00
  Filled 2017-11-16: qty 100

## 2017-11-16 MED ORDER — IOPAMIDOL (ISOVUE-300) INJECTION 61%
100.0000 mL | Freq: Once | INTRAVENOUS | Status: AC | PRN
  Administered 2017-11-16: 100 mL via INTRAVENOUS

## 2017-11-16 MED ORDER — SODIUM CHLORIDE 0.9 % IV BOLUS
1000.0000 mL | Freq: Once | INTRAVENOUS | Status: AC
  Administered 2017-11-16: 1000 mL via INTRAVENOUS

## 2017-11-16 NOTE — ED Notes (Signed)
Date and time results received: 11/16/17 4:58 PM    Test: I stat lactic acid Critical Value: 2.57  Name of Provider Notified: [place]  Orders Received? Or Actions Taken?: MD made aware, waiting for orders

## 2017-11-16 NOTE — ED Notes (Signed)
Bed: [facility] Expected date:  Expected time:  Means of arrival:  Comments: Hold/Room 22

## 2017-11-16 NOTE — ED Triage Notes (Addendum)
Patient here from home with complaints of lower abdominal pain. No nausea, vomiting. Difficulty urinating. Out of insulin for 3 days.

## 2017-11-16 NOTE — ED Provider Notes (Signed)
Emergency Department Provider Note   I have reviewed the triage vital signs and the nursing notes.   HISTORY  Chief Complaint Abdominal Pain   HPI [name] is a 84 y.o. male with multiple medical problems documented below the presents the emergency department today with constipation abdominal pain.  Patient states has had a bowel movement about 4 days even after trying MiraLAX and magnesium citrate.  Has had some abdominal distention and pain during that time as well.  Says he had a colonoscopy recently but does remember when.  Had multiple abdominal surgeries.  No fevers, nausea or vomiting.  Also has some suprapubic pain like he has to urinate but has not been able to urinate either. No other associated or modifying symptoms.    Past Medical History:  Diagnosis Date  . Arthritis   . Chest pain    with activity  . CHF (congestive heart failure) ([place])   . COPD (chronic obstructive pulmonary disease) ([place])   . Diabetes mellitus    type 2   . Dyspnea    home oxygen 3 liters prn, sob with activity, chronic interstitial fibrosis  . GERD (gastroesophageal reflux disease)   . Hyperlipidemia   . Hypertension   . Neuropathy   . Obesity   . Phimosis     Patient Active Problem List   Diagnosis Date Noted  . ILD (interstitial lung disease) ([place]) 07/31/2017  . Painful diabetic neuropathy ([place]) 12/26/2013  . Chest pain at rest 08/29/2013  . Hyperlipidemia 01/01/2012  . Hyponatremia 12/31/2011  . CKD (chronic kidney disease), stage II 12/31/2011  . Dehydration 12/31/2011  . Dizziness 12/31/2011  . Polycythemia 12/31/2011  . Pulmonary nodule 12/31/2011  . Obesity 12/31/2011  . DM (diabetes mellitus), type 2, uncontrolled with complications ([place]) 16/04/9603  . HTN (hypertension), benign 07/10/2011  . Chest pain 07/09/2011  . Shortness of breath 07/09/2011    Past Surgical History:  Procedure Laterality Date  . ABDOMINAL SURGERY     pt has abdominal scarring and  states he had sx but not sure what kind  . BACK SURGERY    . CHOLECYSTECTOMY    . CIRCUMCISION N/A 09/07/2017   Procedure: CIRCUMCISION;  Surgeon: Lucas Mallow, MD;  Location: [facility];  Service: Urology;  Laterality: N/A;    Current Outpatient Rx  . Order #: [id number] Class: Print  . Order #: [id number] Class: Historical Med  . Order #: [id number] Class: Print  . Order #: [id number] Class: Historical Med  . Order #: [id number] Class: Historical Med  . Order #: [id number] Class: Print  . Order #: [id number] Class: Normal  . Order #: [id number] Class: Historical Med  . Order #: [id number] Class: Historical Med  . Order #: [id number] Class: Historical Med  . Order #: [id number] Class: Historical Med  . Order #: [id number] Class: Print  . Order #: [id number] Class: Historical Med  . Order #: [id number] Class: Historical Med  . Order #: [id number] Class: Normal  . Order #: [id number] Class: Print  . Order #: [id number] Class: Normal  . Order #: [id number] Class: Historical Med  . Order #: [id number] Class: Historical Med  . Order #: [id number] Class: Print    Allergies Patient has no known allergies.  Family History  Problem Relation Age of Onset  . Cancer Mother     Social History Social History   Tobacco Use  . Smoking status: Former Smoker    Packs/day: 0.10    Years: 4.00    Pack years: 0.40    Types: Cigarettes    Last attempt to quit:  12/30/1996    Years since quitting: 20.8  . Smokeless tobacco: Never Used  . Tobacco comment: social  Substance Use Topics  . Alcohol use: No  . Drug use: No    Review of Systems  All other systems negative except as documented in the HPI. All pertinent positives and negatives as reviewed in the HPI. ____________________________________________   PHYSICAL EXAM:  VITAL SIGNS: ED Triage Vitals  Enc Vitals Group     BP 11/16/17 1404 128/82     Pulse Rate 11/16/17 1404 (!) 105     Resp 11/16/17 1404 18     Temp 11/16/17 1404 98.5 F (36.9 C)     Temp  Source 11/16/17 1404 Oral     SpO2 11/16/17 1404 98 %    Constitutional: Alert and oriented. Well appearing and in no acute distress. Eyes: Conjunctivae are normal. PERRL. EOMI. Head: Atraumatic. Nose: No congestion/rhinnorhea. Mouth/Throat: Mucous membranes are moist.  Oropharynx non-erythematous. Neck: No stridor.  No meningeal signs.   Cardiovascular: Normal rate, regular rhythm. Good peripheral circulation. Grossly normal heart sounds.   Respiratory: Normal respiratory effort.  No retractions. Lungs CTAB. Gastrointestinal: Soft and nontender. Mild distention.  Musculoskeletal: No lower extremity tenderness nor edema. No gross deformities of extremities. Neurologic:  Normal speech and language. No gross focal neurologic deficits are appreciated.  Skin:  Skin is warm, dry and intact. No rash noted.   ____________________________________________   LABS (all labs ordered are listed, but only abnormal results are displayed)  Labs Reviewed  CBC WITH DIFFERENTIAL/PLATELET - Abnormal; Notable for the following components:      Result Value   WBC 11.6 (*)    Hemoglobin 18.1 (*)    Neutro Abs 8.3 (*)    All other components within normal limits  COMPREHENSIVE METABOLIC PANEL - Abnormal; Notable for the following components:   Sodium 133 (*)    Chloride 93 (*)    Glucose, Bld 483 (*)    BUN 22 (*)    Creatinine, Ser 1.26 (*)    Albumin 3.2 (*)    GFR calc non Af Amer 50 (*)    GFR calc Af Amer 58 (*)    All other components within normal limits  URINALYSIS, ROUTINE W REFLEX MICROSCOPIC - Abnormal; Notable for the following components:   Color, Urine STRAW (*)    Glucose, UA >=500 (*)    Hgb urine dipstick SMALL (*)    Ketones, ur 5 (*)    Protein, ur 30 (*)    All other components within normal limits  CBG MONITORING, ED - Abnormal; Notable for the following components:   Glucose-Capillary 471 (*)    All other components within normal limits  I-STAT CG4 LACTIC ACID, ED -  Abnormal; Notable for the following components:   Lactic Acid, Venous 2.57 (*)    All other components within normal limits  I-STAT CG4 LACTIC ACID, ED - Abnormal; Notable for the following components:   Lactic Acid, Venous 2.47 (*)    All other components within normal limits  LIPASE, BLOOD   ____________________________________________  RADIOLOGY  Ct Abdomen Pelvis W Contrast  Result Date: 11/16/2017 CLINICAL DATA:  Constipation for the past 4 days. Lower abdominal pain. EXAM: CT ABDOMEN AND PELVIS WITH CONTRAST TECHNIQUE: Multidetector CT imaging of the abdomen and pelvis was performed using the standard protocol following bolus administration of intravenous contrast. CONTRAST:  17mL ISOVUE-300 IOPAMIDOL (ISOVUE-300) INJECTION 61% COMPARISON:  CT abdomen pelvis dated June 10, 2017. FINDINGS: Lower chest:  Fibrotic changes at the lung bases are similar to prior study. Hepatobiliary: Stable hepatic cysts. Status post cholecystectomy. No biliary dilatation. Pancreas: Unremarkable. No pancreatic ductal dilatation or surrounding inflammatory changes. Spleen: Normal in size without focal abnormality. Adrenals/Urinary Tract: The adrenal glands are unremarkable. Multiple bilateral renal cysts are unchanged. No renal or ureteral calculi. No hydronephrosis. The bladder is decompressed by Foley catheter. Stomach/Bowel: Stomach is within normal limits. Appendix appears normal. No evidence of bowel wall thickening, distention, or inflammatory changes. Large amount of stool throughout the colon. Vascular/Lymphatic: Unchanged aneurysmal dilatation of the bilateral common iliac and internal iliac arteries, measuring up to 2.3 cm. 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 Aortic atherosclerosis. No enlarged abdominal or pelvic lymph nodes. Reproductive: Prostate is unremarkable. Other: No free fluid or pneumoperitoneum. Prior ventral hernia repair. Musculoskeletal: No acute or  significant osseous findings. Severe lower lumbar facet arthropathy again noted. IMPRESSION: 1.  No acute intra-abdominal process. 2.  Prominent stool throughout the colon favors constipation. 3. 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 Recommend followup by ultrasound in 5 years. This recommendation follows ACR consensus guidelines: White Paper of the ACR Incidental Findings Committee II on Vascular Findings. J Am Coll Radiol 2013; 10:789-794. 4. 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 5. Pulmonary fibrosis, not significantly changed. Electronically Signed   By: Titus Dubin M.D.   On: 11/16/2017 18:30    ____________________________________________   PROCEDURES  Procedure(s) performed:   Procedures   ____________________________________________   INITIAL IMPRESSION / ASSESSMENT AND PLAN / ED COURSE  Constipation. Improved with enema. No e/o obstruction or other symptoms on ct scan. Labs ok. Stable for dc w/ pcp follow up.   Pertinent labs & imaging results that were available during my care of the patient were reviewed by me and considered in my medical decision making (see chart for details).  ____________________________________________  FINAL CLINICAL IMPRESSION(S) / ED DIAGNOSES  Final diagnoses:  Constipation, unspecified constipation type     MEDICATIONS GIVEN DURING THIS VISIT:  Medications  polyethylene glycol (MIRALAX / GLYCOLAX) packet 17 g (has no administration in time range)  docusate sodium (COLACE) capsule 100 mg (has no administration in time range)  lactated ringers bolus 1,000 mL (0 mLs Intravenous Stopped 11/16/17 1842)  iopamidol (ISOVUE-300) 61 % injection 100 mL (100 mLs Intravenous Contrast Given 11/16/17 1809)  iopamidol (ISOVUE-300) 61 % injection (  Contrast Given 11/16/17 1750)  sorbitol, milk of mag, mineral oil, glycerin (SMOG) enema (960 mLs Rectal Given 11/16/17  2134)  sodium chloride 0.9 % bolus 1,000 mL (1,000 mLs Intravenous New Bag/Given 11/16/17 2134)     NEW OUTPATIENT MEDICATIONS STARTED DURING THIS VISIT:  New Prescriptions   DOCUSATE SODIUM (COLACE) 100 MG CAPSULE    Take 1 capsule (100 mg total) by mouth every 12 (twelve) hours.   POLYETHYLENE GLYCOL (MIRALAX / GLYCOLAX) PACKET    Take 17 g by mouth 2 (two) times daily for 7 days.    Note:  This note was prepared with assistance of Dragon voice recognition software. Occasional wrong-word or sound-a-like substitutions may have occurred due to the inherent limitations of voice recognition software.   Merrily Pew, MD 11/16/17 2328

## 2017-11-16 NOTE — ED Notes (Signed)
Bed: [facility] Expected date: 11/15/17 Expected time: 7:00 AM Means of arrival:  Comments:

## 2017-11-20 NOTE — Telephone Encounter (Signed)
Following up.  Routing to MR and Raquel Sarna.

## 2017-11-21 NOTE — Telephone Encounter (Signed)
At pt's last OV with MR 4/30, plan is to not go through with OFEV.  Called [facility] and spoke with Thayer Jew stating to him that we are not going to go through with pt's OFEV.   Thayer Jew expressed understanding and stated he would send info to [place].  Nothing further needed.

## 2017-11-23 ENCOUNTER — Telehealth: Payer: Self-pay | Admitting: Internal Medicine

## 2017-11-23 ENCOUNTER — Ambulatory Visit: Payer: Medicare Other | Admitting: Internal Medicine

## 2017-11-23 NOTE — Telephone Encounter (Signed)
He was no show today. Pleae give him first avail 30 min slot in regular or ILD clinic  Dr. Brand Males, M.D., [facility]C.P Pulmonary and Critical Care Medicine Staff Physician, [place] Director - Interstitial Lung Disease  Program  [facility] at [place], [place], [address]  Pager: [phone], If no answer or between  15:00h - 7:00h: call [phone] Telephone: [phone]

## 2017-11-28 NOTE — Telephone Encounter (Signed)
Pt scheduled for 6/25 at 4:00.

## 2017-12-01 ENCOUNTER — Encounter (HOSPITAL_COMMUNITY): Payer: Self-pay | Admitting: Emergency Medicine

## 2017-12-01 ENCOUNTER — Other Ambulatory Visit: Payer: Self-pay

## 2017-12-01 ENCOUNTER — Emergency Department (HOSPITAL_COMMUNITY)
Admission: EM | Admit: 2017-12-01 | Discharge: 2017-12-01 | Disposition: A | Discharge status: Home / Self Care | Payer: Medicare HMO | Attending: Emergency Medicine | Admitting: Emergency Medicine

## 2017-12-01 ENCOUNTER — Emergency Department (HOSPITAL_COMMUNITY): Payer: Medicare HMO

## 2017-12-01 DIAGNOSIS — J8410 Pulmonary fibrosis, unspecified: Secondary | ICD-10-CM

## 2017-12-01 DIAGNOSIS — I509 Heart failure, unspecified: Secondary | ICD-10-CM | POA: Insufficient documentation

## 2017-12-01 DIAGNOSIS — N182 Chronic kidney disease, stage 2 (mild): Secondary | ICD-10-CM | POA: Diagnosis not present

## 2017-12-01 DIAGNOSIS — I130 Hypertensive heart and chronic kidney disease with heart failure and stage 1 through stage 4 chronic kidney disease, or unspecified chronic kidney disease: Secondary | ICD-10-CM | POA: Insufficient documentation

## 2017-12-01 DIAGNOSIS — Z87891 Personal history of nicotine dependence: Secondary | ICD-10-CM | POA: Insufficient documentation

## 2017-12-01 DIAGNOSIS — R0600 Dyspnea, unspecified: Secondary | ICD-10-CM

## 2017-12-01 DIAGNOSIS — Z794 Long term (current) use of insulin: Secondary | ICD-10-CM | POA: Insufficient documentation

## 2017-12-01 DIAGNOSIS — E119 Type 2 diabetes mellitus without complications: Secondary | ICD-10-CM | POA: Insufficient documentation

## 2017-12-01 DIAGNOSIS — J449 Chronic obstructive pulmonary disease, unspecified: Secondary | ICD-10-CM | POA: Diagnosis not present

## 2017-12-01 DIAGNOSIS — Z79899 Other long term (current) drug therapy: Secondary | ICD-10-CM | POA: Insufficient documentation

## 2017-12-01 DIAGNOSIS — R0602 Shortness of breath: Secondary | ICD-10-CM | POA: Diagnosis not present

## 2017-12-01 LAB — BASIC METABOLIC PANEL
ANION GAP: 8 (ref 5–15)
BUN: 14 mg/dL (ref 6–20)
CHLORIDE: 103 mmol/L (ref 101–111)
CO2: 27 mmol/L (ref 22–32)
CREATININE: 1.29 mg/dL — AB (ref 0.61–1.24)
Calcium: 8.7 mg/dL — ABNORMAL LOW (ref 8.9–10.3)
GFR calc non Af Amer: 49 mL/min — ABNORMAL LOW (ref >60)
GFR, EST AFRICAN AMERICAN: 57 mL/min — AB (ref >60)
GLUCOSE: 302 mg/dL — AB (ref 65–99)
Potassium: 4.2 mmol/L (ref 3.5–5.1)
Sodium: 138 mmol/L (ref 135–145)

## 2017-12-01 LAB — CBC WITH DIFFERENTIAL/PLATELET
BASOS PCT: 1 %
Basophils Absolute: 0 10*3/uL (ref 0.0–0.1)
Eosinophils Absolute: 0.4 10*3/uL (ref 0.0–0.7)
Eosinophils Relative: 6 %
HEMATOCRIT: 47.9 % (ref 39.0–52.0)
Hemoglobin: 15.9 g/dL (ref 13.0–17.0)
LYMPHS PCT: 23 %
Lymphs Abs: 1.5 10*3/uL (ref 0.7–4.0)
MCH: 30.6 pg (ref 26.0–34.0)
MCHC: 33.2 g/dL (ref 30.0–36.0)
MCV: 92.1 fL (ref 78.0–100.0)
MONOS PCT: 7 %
Monocytes Absolute: 0.5 10*3/uL (ref 0.1–1.0)
NEUTROS ABS: 4.2 10*3/uL (ref 1.7–7.7)
Neutrophils Relative %: 63 %
Platelets: 151 10*3/uL (ref 150–400)
RBC: 5.2 MIL/uL (ref 4.22–5.81)
RDW: 15 % (ref 11.5–15.5)
WBC: 6.6 10*3/uL (ref 4.0–10.5)

## 2017-12-01 LAB — CBG MONITORING, ED: Glucose-Capillary: 313 mg/dL — ABNORMAL HIGH (ref 65–99)

## 2017-12-01 LAB — BRAIN NATRIURETIC PEPTIDE: B Natriuretic Peptide: 16.1 pg/mL (ref 0.0–100.0)

## 2017-12-01 LAB — TROPONIN I

## 2017-12-01 MED ORDER — SODIUM CHLORIDE 0.9 % IV SOLN
INTRAVENOUS | Status: DC
  Administered 2017-12-01: 21:00:00 via INTRAVENOUS

## 2017-12-01 NOTE — ED Triage Notes (Signed)
Pt presents by EMS from home for evaluation of shortness of breath that has been ongoing for the last month. Pt reports being seen at this facility several weeks ago and continues to have shortness of breath. EMS reports pt was hyperglycemic and pt was unsure of last dose of insulin.

## 2017-12-01 NOTE — ED Provider Notes (Addendum)
[facility] DEPT Provider Note   CSN: [id number] Arrival date & time: 12/01/17  1832     History   Chief Complaint Chief Complaint  Patient presents with  . Shortness of Breath  . Hyperglycemia    HPI [name] is a 84 y.o. male.  84 year old male with history of COPD and CHF on chronic home oxygen at 3 L presents with worsening dyspnea x1 month.  States over the last several days he has noted increasing symptoms described as dyspnea on exertion as well as orthopnea.  Is also noted increased lower extremity edema.  Has had constant chest tightness for 3 days which is worse with taking a deep breath.  Cough is been nonproductive and without associated fever or chills.  States compliance with his medications.  Called EMS and was also found to be hyperglycemic with blood sugar 300.  He is unsure of his last insulin dose.  Here for further treatment     Past Medical History:  Diagnosis Date  . Arthritis   . Chest pain    with activity  . CHF (congestive heart failure) ([place])   . COPD (chronic obstructive pulmonary disease) ([place])   . Diabetes mellitus    type 2   . Dyspnea    home oxygen 3 liters prn, sob with activity, chronic interstitial fibrosis  . GERD (gastroesophageal reflux disease)   . Hyperlipidemia   . Hypertension   . Neuropathy   . Obesity   . Phimosis     Patient Active Problem List   Diagnosis Date Noted  . ILD (interstitial lung disease) ([place]) 07/31/2017  . Painful diabetic neuropathy ([place]) 12/26/2013  . Chest pain at rest 08/29/2013  . Hyperlipidemia 01/01/2012  . Hyponatremia 12/31/2011  . CKD (chronic kidney disease), stage II 12/31/2011  . Dehydration 12/31/2011  . Dizziness 12/31/2011  . Polycythemia 12/31/2011  . Pulmonary nodule 12/31/2011  . Obesity 12/31/2011  . DM (diabetes mellitus), type 2, uncontrolled with complications ([place]) 26/71/2458  . HTN (hypertension), benign 07/10/2011  . Chest pain  07/09/2011  . Shortness of breath 07/09/2011    Past Surgical History:  Procedure Laterality Date  . ABDOMINAL SURGERY     pt has abdominal scarring and states he had sx but not sure what kind  . BACK SURGERY    . CHOLECYSTECTOMY    . CIRCUMCISION N/A 09/07/2017   Procedure: CIRCUMCISION;  Surgeon: Lucas Mallow, MD;  Location: [facility] ORS;  Service: Urology;  Laterality: N/A;        Home Medications    Prior to Admission medications   Medication Sig Start Date End Date Taking? Authorizing Provider  acetaminophen (TYLENOL) 500 MG tablet Take 1 tablet (500 mg total) by mouth every 6 (six) hours as needed. 08/01/17   Arrien, Jimmy Picket, MD  clotrimazole (LOTRIMIN) 1 % cream Apply 1 application topically 2 (two) times daily.    [provider]  docusate sodium (COLACE) 100 MG capsule Take 1 capsule (100 mg total) by mouth every 12 (twelve) hours. 11/16/17   Mesner, Corene Cornea, MD  fluconazole (DIFLUCAN) 150 MG tablet Take 150 mg by mouth daily.    [provider]  furosemide (LASIX) 40 MG tablet Take 40 mg by mouth daily.     [provider]  HYDROcodone-acetaminophen (NORCO) 10-325 MG per tablet Take 1 tablet by mouth every 6 (six) hours as needed. Patient taking differently: Take 1 tablet by mouth every 4 (four) hours as needed for  moderate pain or severe pain.  08/11/14   Drenda Freeze, MD  insulin glargine (LANTUS) 100 UNIT/ML injection Inject 0.15 mLs (15 Units total) into the skin at bedtime. 08/01/17   Arrien, Jimmy Picket, MD  ketoconazole (NIZORAL) 2 % cream Apply 1 application topically daily.    [provider]  ketoconazole (NIZORAL) 200 MG tablet Take 200 mg by mouth daily.    [provider]  losartan (COZAAR) 50 MG tablet Take 50 mg by mouth daily. 05/05/15   [provider]  metroNIDAZOLE (FLAGYL) 500 MG tablet Take 500 mg by mouth 2 (two) times daily.    [provider]  nitroGLYCERIN (NITROSTAT) 0.4 MG SL  tablet Place 1 tablet (0.4 mg total) under the tongue every 5 (five) minutes x 3 doses as needed for chest pain. 08/31/13   Dixie Dials, MD  nystatin (NYSTATIN) powder Apply 1 g topically 3 (three) times daily.    [provider]  OXYGEN Inhale into the lungs. 3 liters prn    [provider]  pantoprazole (PROTONIX) 40 MG tablet Take 1 tablet (40 mg total) by mouth 2 (two) times daily. 08/01/17 09/05/17  Arrien, Jimmy Picket, MD  predniSONE (DELTASONE) 10 MG tablet 40mg x1week, 30mg x1week, 20mg x1week, then 10mg  daily 10/30/17   Brand Males, MD  PROAIR HFA 108 (90 BASE) MCG/ACT inhaler Inhale 1 puff into the lungs every 4 (four) hours as needed for wheezing or shortness of breath.  02/11/14   [provider]  tamsulosin (FLOMAX) 0.4 MG CAPS capsule Take 0.4 mg by mouth 2 (two) times daily.    [provider]  traMADol (ULTRAM) 50 MG tablet Take 1 tablet (50 mg total) by mouth every 6 (six) hours as needed. 09/07/17 09/07/18  Lucas Mallow, MD    Family History Family History  Problem Relation Age of Onset  . Cancer Mother     Social History Social History   Tobacco Use  . Smoking status: Former Smoker    Packs/day: 0.10    Years: 4.00    Pack years: 0.40    Types: Cigarettes    Last attempt to quit: 12/30/1996    Years since quitting: 20.9  . Smokeless tobacco: Never Used  . Tobacco comment: social  Substance Use Topics  . Alcohol use: No  . Drug use: No     Allergies   Patient has no known allergies.   Review of Systems Review of Systems  All other systems reviewed and are negative.    Physical Exam Updated Vital Signs BP (!) 142/106 (BP Location: Left Arm)   Pulse (!) 104   Temp 98 F (36.7 C) (Oral)   Resp 13   Ht 1.791 m (5' 10.5")   Wt 115.7 kg (255 lb)   SpO2 96%   BMI 36.07 kg/m   Physical Exam  Constitutional: He is oriented to person, place, and time. He appears well-developed and well-nourished.  Non-toxic  appearance. No distress.  HENT:  Head: Normocephalic and atraumatic.  Eyes: Pupils are equal, round, and reactive to light. Conjunctivae, EOM and lids are normal.  Neck: Normal range of motion. Neck supple. No tracheal deviation present. No thyroid mass present.  Cardiovascular: Regular rhythm and normal heart sounds. Tachycardia present. Exam reveals no gallop.  No murmur heard. Pulmonary/Chest: Effort normal. No stridor. No respiratory distress. He has decreased breath sounds in the right lower field and the left lower field. He has no wheezes. He has rhonchi in the right  lower field and the left lower field. He has no rales.  Abdominal: Soft. Normal appearance and bowel sounds are normal. He exhibits no distension. There is no tenderness. There is no rebound and no CVA tenderness.  Musculoskeletal: Normal range of motion. He exhibits no edema or tenderness.  Lymphadenopathy:  2+ bilateral lower extremity pitting edema  Neurological: He is alert and oriented to person, place, and time. He has normal strength. No cranial nerve deficit or sensory deficit. GCS eye subscore is 4. GCS verbal subscore is 5. GCS motor subscore is 6.  Skin: Skin is warm and dry. No abrasion and no rash noted.  Psychiatric: He has a normal mood and affect. His speech is normal and behavior is normal.  Nursing note and vitals reviewed.    ED Treatments / Results  Labs (all labs ordered are listed, but only abnormal results are displayed) Labs Reviewed  CBG MONITORING, ED - Abnormal; Notable for the following components:      Result Value   Glucose-Capillary 313 (*)    All other components within normal limits  CBC WITH DIFFERENTIAL/PLATELET  BASIC METABOLIC PANEL  BRAIN NATRIURETIC PEPTIDE  TROPONIN I    EKG EKG Interpretation  Date/Time:  Saturday December 01 2017 19:28:46 EDT Ventricular Rate:  99 PR Interval:    QRS Duration: 100 QT Interval:  340 QTC Calculation: 437 R Axis:   23 Text  Interpretation:  Sinus rhythm Prolonged PR interval Minimal ST elevation, anterolateral leads Confirmed by Lacretia Leigh ([id number]) on 12/01/2017 7:38:57 PM   Radiology No results found.  Procedures Procedures (including critical care time)  Medications Ordered in ED Medications  0.9 %  sodium chloride infusion (has no administration in time range)     Initial Impression / Assessment and Plan / ED Course  I have reviewed the triage vital signs and the nursing notes.  Pertinent labs & imaging results that were available during my care of the patient were reviewed by me and considered in my medical decision making (see chart for details).     Per review the patient's a medical record, patient has a history of pulmonary fibrosis.  He is chronically on oxygen.  He has not had to increase his.  Suspect that this is the etiology of his cough which is been over the past month.  His chest x-ray is unchanged.  BNP is normal.  CBC is normal.  Troponin is negative.  Low suspicion for PE.  Was instructed to follow-up with his pulmonologist. Final Clinical Impressions(s) / ED Diagnoses   Final diagnoses:  SOB (shortness of breath)    ED Discharge Orders    None       Lacretia Leigh, MD 12/01/17 2217    Lacretia Leigh, MD 12/01/17 2217

## 2017-12-25 ENCOUNTER — Ambulatory Visit: Payer: Medicare Other | Admitting: Internal Medicine

## 2017-12-26 ENCOUNTER — Telehealth: Payer: Self-pay | Admitting: Internal Medicine

## 2017-12-26 NOTE — Telephone Encounter (Signed)
Will route message to Dr. Chase Caller to make him aware.

## 2017-12-26 NOTE — Telephone Encounter (Signed)
Please get condolence card 

## 2017-12-31 DEATH — deceased

## 2020-01-03 IMAGING — RF DG ESOPHAGUS
6 series · 14 of 17 positions shown · non-contrast
Comparison: None.

CLINICAL DATA: Chest pain, patulous esophagus on CT

EXAM:
ESOPHOGRAM/BARIUM SWALLOW
TECHNIQUE: Single contrast examination was performed using  thin barium.
FLUOROSCOPY TIME:  Fluoroscopy Time:  42 seconds
Radiation Exposure Index (if provided by the fluoroscopic device):
24.3 mGy
Number of Acquired Spot Images: 6

[Series 1: cp_standard · 0.51mm/px · 3 of 135 frames shown (1 of 3)]
[frame 21/135]
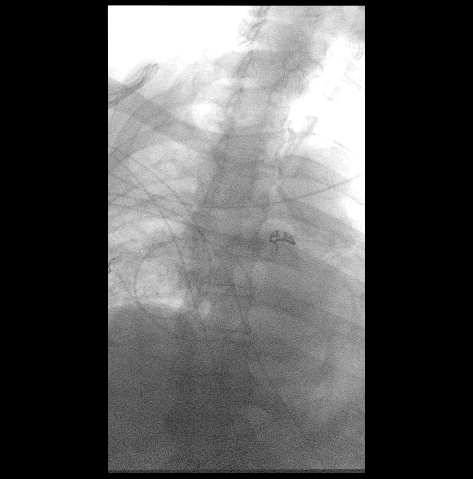
[frame 68/135]
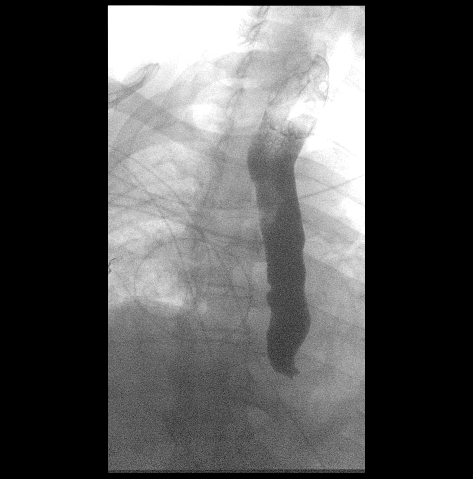
[frame 115/135]
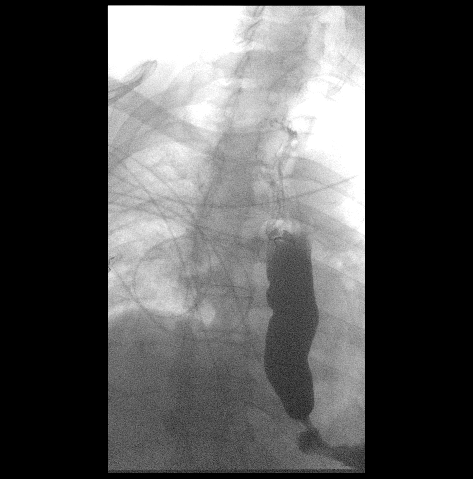

[Series 2: fluoro_barium 2fps_bw · 0.17mm/px · 1 of 1 slices shown (1 of 3)]
[im 1/1]
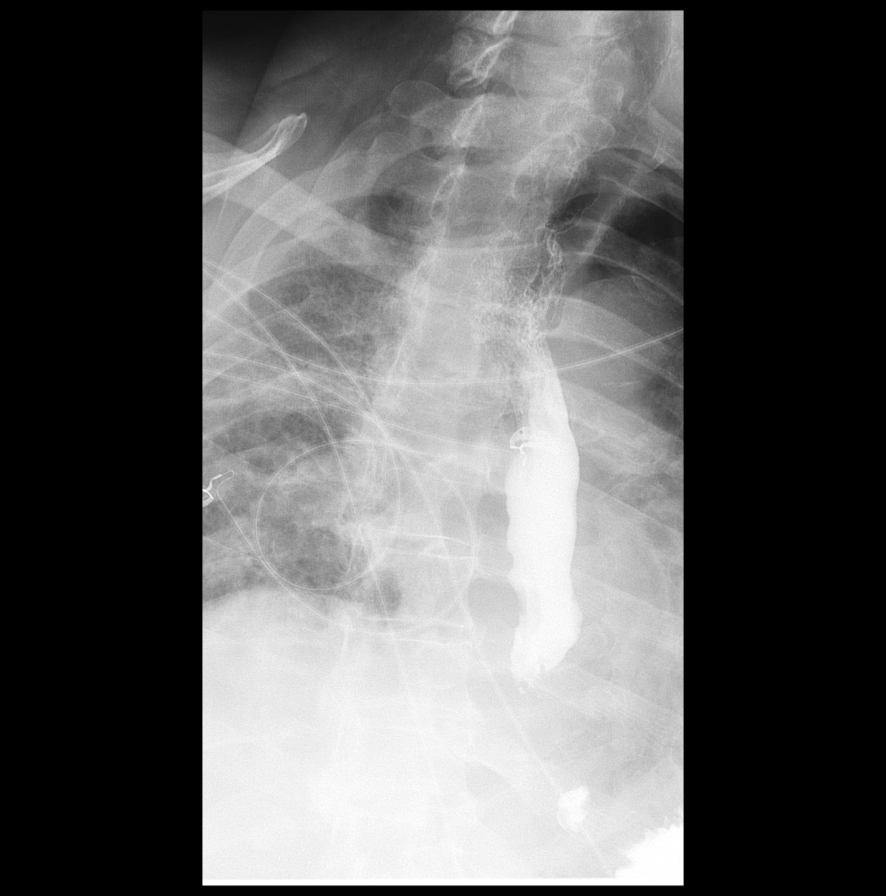

[Series 3: cp_standard · 0.51mm/px · 3 of 85 frames shown (2 of 3)]
[frame 3/85]
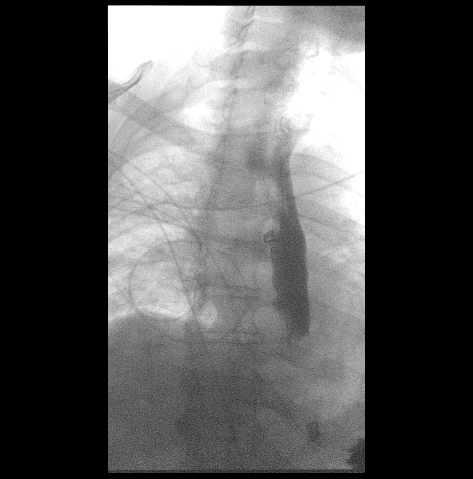
[frame 13/85]
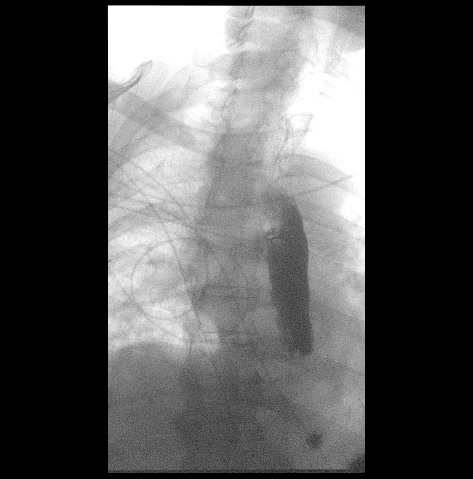
[frame 43/85]
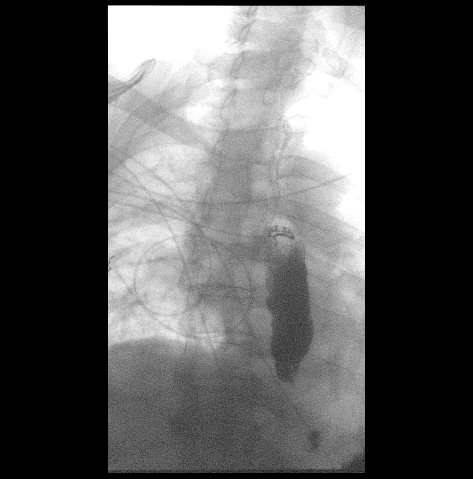

[Series 4: cp_standard · 0.51mm/px · 4 of 153 frames shown (3 of 3)]
[frame 23/153]
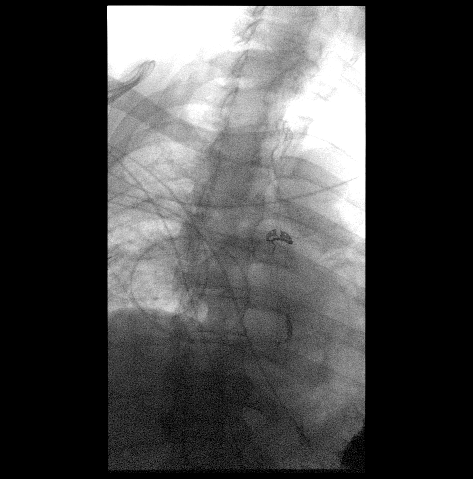
[frame 41/153]
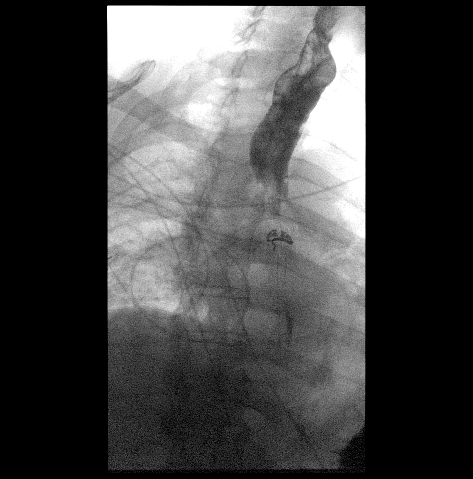
[frame 77/153]
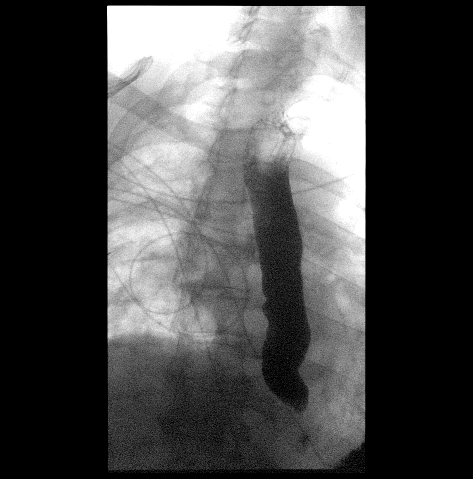
[frame 131/153]
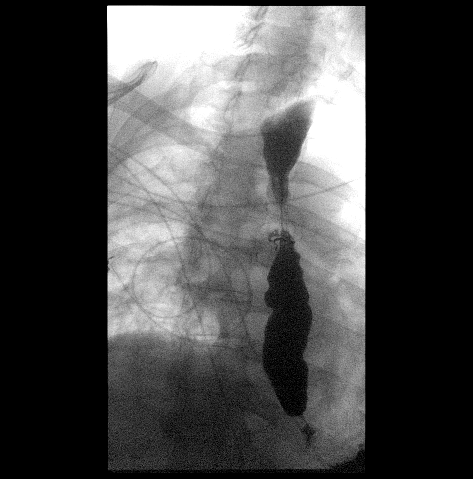

[Series 5: fluoro_barium 2fps_bw · 0.17mm/px · 1 of 2 frames shown (2 of 3)]
[frame 1/2]
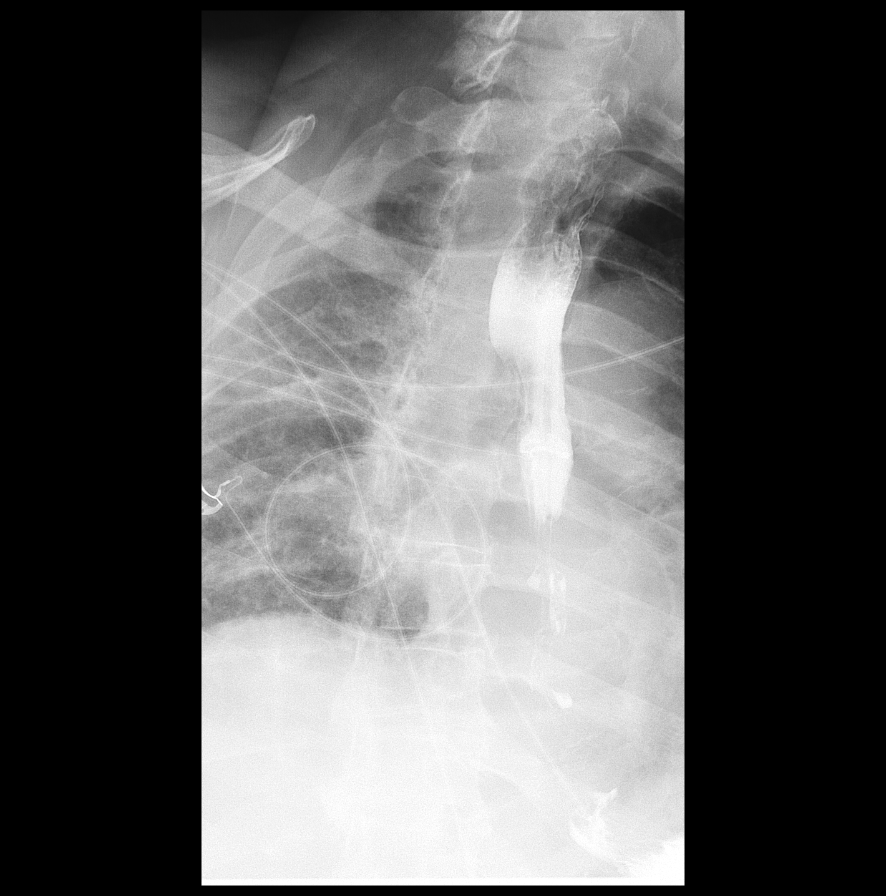

[Series 6: fluoro_barium 2fps_bw · 0.17mm/px · 2 of 2 frames shown (3 of 3)]
[frame 1/2]
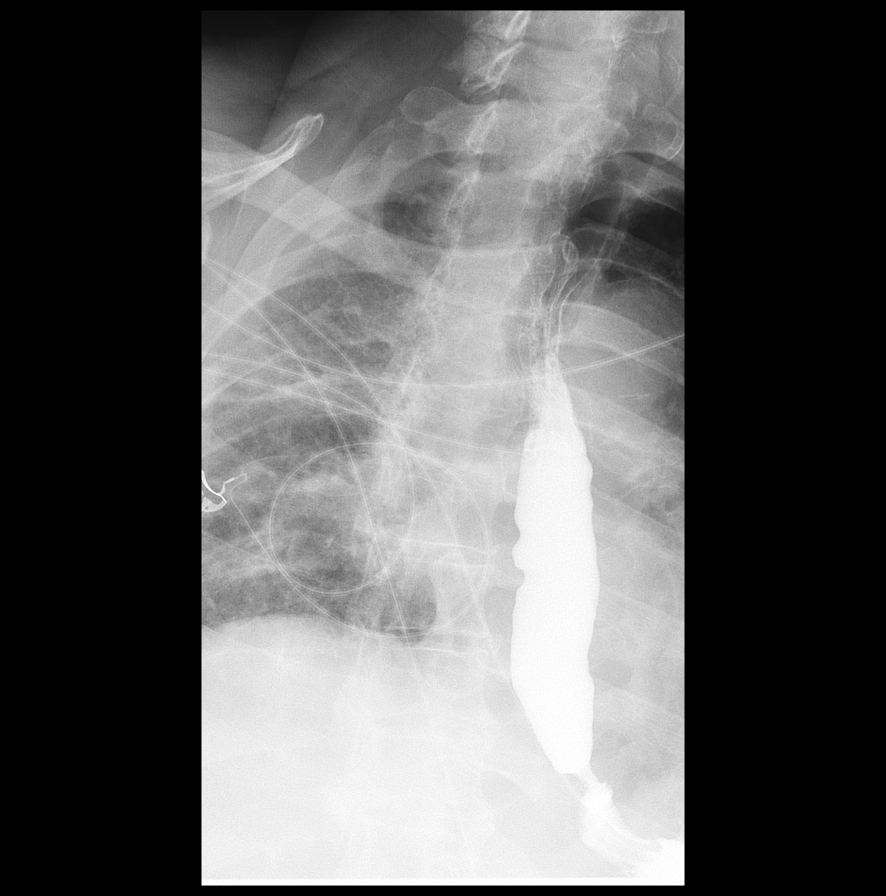
[frame 2/2]
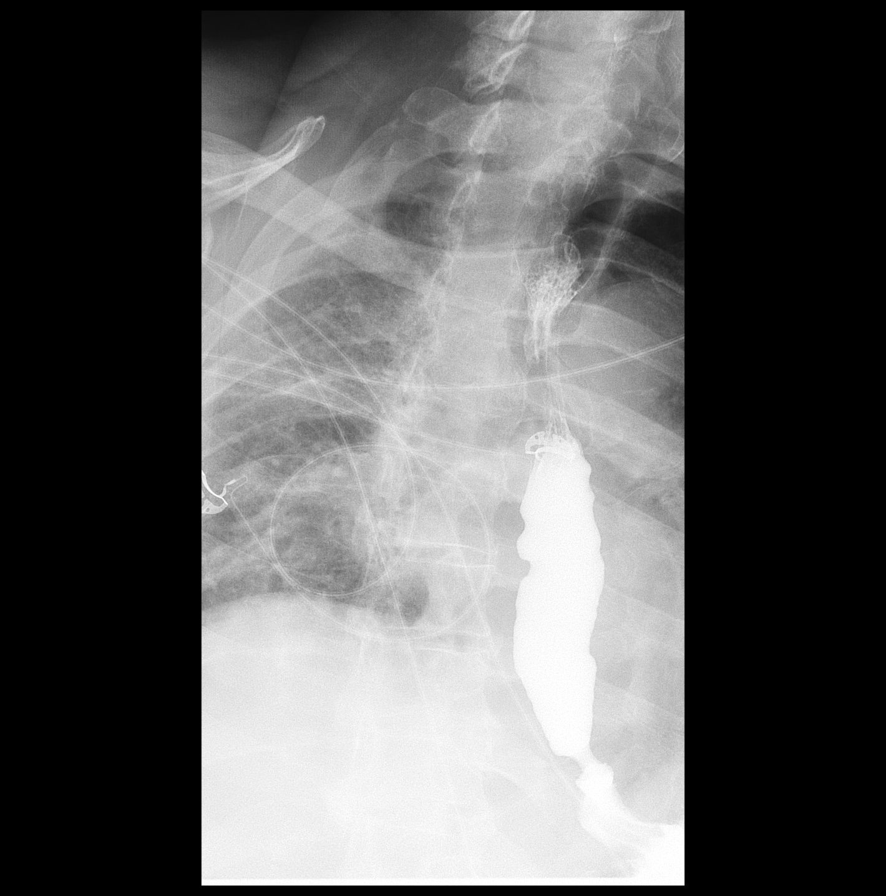

[14 of 17 positions shown; findings below may reference images not displayed]

FINDINGS: Given patient's condition, limited evaluation was performed in semi
recumbent supine position.

Mildly dilated, fluid-filled esophagus.

Moderate esophageal dysmotility.

No fixed esophageal narrowing or stricture. A barium tablet was not
administered given dysmotility.
IMPRESSION: Mildly dilated esophagus with moderate esophageal dysmotility.

No fixed esophageal narrowing or stricture.
